# Patient Record
Sex: Male | Born: 1960 | Race: White | Hispanic: No | Marital: Married | Smoking: Never smoker
Health system: Southern US, Community
[De-identification: ages and names within clinical notes are randomized; demographics above are authoritative.]

## PROBLEM LIST (undated history)

## (undated) DIAGNOSIS — R42 Dizziness and giddiness: Secondary | ICD-10-CM

## (undated) DIAGNOSIS — Q613 Polycystic kidney, unspecified: Secondary | ICD-10-CM

## (undated) DIAGNOSIS — I252 Old myocardial infarction: Secondary | ICD-10-CM

## (undated) DIAGNOSIS — J45909 Unspecified asthma, uncomplicated: Secondary | ICD-10-CM

## (undated) DIAGNOSIS — I209 Angina pectoris, unspecified: Secondary | ICD-10-CM

## (undated) DIAGNOSIS — E785 Hyperlipidemia, unspecified: Secondary | ICD-10-CM

## (undated) DIAGNOSIS — Z8719 Personal history of other diseases of the digestive system: Secondary | ICD-10-CM

## (undated) DIAGNOSIS — I2510 Atherosclerotic heart disease of native coronary artery without angina pectoris: Secondary | ICD-10-CM

## (undated) HISTORY — PX: INGUINAL HERNIA REPAIR: SUR1180

## (undated) HISTORY — DX: Personal history of other diseases of the digestive system: Z87.19

## (undated) HISTORY — DX: Dizziness and giddiness: R42

---

## 1982-03-08 HISTORY — PX: FOOT SURGERY: SHX648

## 2008-03-03 ENCOUNTER — Emergency Department (HOSPITAL_BASED_OUTPATIENT_CLINIC_OR_DEPARTMENT_OTHER): Admission: EM | Admit: 2008-03-03 | Discharge: 2008-03-03 | Payer: Self-pay | Admitting: Emergency Medicine

## 2010-05-07 ENCOUNTER — Observation Stay (HOSPITAL_COMMUNITY)
Admission: AD | Admit: 2010-05-07 | Discharge: 2010-05-08 | Disposition: A | Discharge status: Home / Self Care | Payer: Managed Care, Other (non HMO) | Source: Other Acute Inpatient Hospital | Attending: Cardiovascular Disease | Admitting: Cardiovascular Disease

## 2010-05-07 ENCOUNTER — Emergency Department (HOSPITAL_COMMUNITY)
Admission: AD | Admit: 2010-05-07 | Discharge: 2010-05-07 | Disposition: A | Discharge status: Other Acute Inpatient Hospital | Payer: Managed Care, Other (non HMO) | Source: Other Acute Inpatient Hospital | Attending: Cardiovascular Disease | Admitting: Cardiovascular Disease

## 2010-05-07 ENCOUNTER — Emergency Department (INDEPENDENT_AMBULATORY_CARE_PROVIDER_SITE_OTHER): Payer: Managed Care, Other (non HMO)

## 2010-05-07 DIAGNOSIS — R110 Nausea: Secondary | ICD-10-CM | POA: Insufficient documentation

## 2010-05-07 DIAGNOSIS — R42 Dizziness and giddiness: Secondary | ICD-10-CM | POA: Insufficient documentation

## 2010-05-07 DIAGNOSIS — R079 Chest pain, unspecified: Secondary | ICD-10-CM

## 2010-05-07 DIAGNOSIS — R002 Palpitations: Secondary | ICD-10-CM | POA: Insufficient documentation

## 2010-05-07 DIAGNOSIS — M79609 Pain in unspecified limb: Secondary | ICD-10-CM | POA: Insufficient documentation

## 2010-05-07 DIAGNOSIS — I200 Unstable angina: Secondary | ICD-10-CM | POA: Insufficient documentation

## 2010-05-07 LAB — MRSA PCR SCREENING: MRSA by PCR: NEGATIVE

## 2010-05-07 LAB — CBC
HCT: 39.8 % (ref 39.0–52.0)
Hemoglobin: 14.4 g/dL (ref 13.0–17.0)
MCHC: 36.3 g/dL — ABNORMAL HIGH (ref 30.0–36.0)
MCV: 84.3 fL (ref 78.0–100.0)
Platelets: 224 10*3/uL (ref 150–400)
Platelets: 187 10*3/uL (ref 150–400)
RBC: 4.72 MIL/uL (ref 4.22–5.81)
RBC: 4.58 MIL/uL (ref 4.22–5.81)
WBC: 6.4 10*3/uL (ref 4.0–10.5)

## 2010-05-07 LAB — DIFFERENTIAL
Basophils Absolute: 0 10*3/uL (ref 0.0–0.1)
Lymphocytes Relative: 20 % (ref 12–46)
Lymphs Abs: 1.3 10*3/uL (ref 0.7–4.0)
Neutrophils Relative %: 72 % (ref 43–77)

## 2010-05-07 LAB — POCT CARDIAC MARKERS: Troponin i, poc: <0.05 ng/mL (ref 0.00–0.09)

## 2010-05-07 LAB — BASIC METABOLIC PANEL
BUN: 23 mg/dL (ref 6–23)
CO2: 25 mEq/L (ref 19–32)
Chloride: 109 mEq/L (ref 96–112)
Glucose, Bld: 123 mg/dL — ABNORMAL HIGH (ref 70–99)
Potassium: 4.2 mEq/L (ref 3.5–5.1)

## 2010-05-07 LAB — PROTIME-INR: INR: 0.97 (ref 0.00–1.49)

## 2010-05-07 LAB — APTT: aPTT: 31 seconds (ref 24–37)

## 2010-05-07 LAB — HEMOCCULT GUIAC POC 1CARD (OFFICE): Fecal Occult Bld: NEGATIVE

## 2010-05-08 DIAGNOSIS — R079 Chest pain, unspecified: Secondary | ICD-10-CM | POA: Insufficient documentation

## 2010-05-08 DIAGNOSIS — K219 Gastro-esophageal reflux disease without esophagitis: Secondary | ICD-10-CM | POA: Insufficient documentation

## 2010-05-08 DIAGNOSIS — I200 Unstable angina: Secondary | ICD-10-CM | POA: Insufficient documentation

## 2010-05-08 HISTORY — DX: Gastro-esophageal reflux disease without esophagitis: K21.9

## 2010-05-08 LAB — COMPREHENSIVE METABOLIC PANEL
AST: 16 U/L (ref 0–37)
Albumin: 4.1 g/dL (ref 3.5–5.2)
Chloride: 109 mEq/L (ref 96–112)
Creatinine, Ser: 1.51 mg/dL — ABNORMAL HIGH (ref 0.4–1.5)
GFR calc Af Amer: 59 mL/min — ABNORMAL LOW (ref >60)
Total Bilirubin: 0.7 mg/dL (ref 0.3–1.2)
Total Protein: 6.9 g/dL (ref 6.0–8.3)

## 2010-05-08 LAB — CARDIAC PANEL(CRET KIN+CKTOT+MB+TROPI)
CK, MB: 2.8 ng/mL (ref 0.3–4.0)
Relative Index: INVALID (ref 0.0–2.5)
Relative Index: INVALID (ref 0.0–2.5)
Total CK: 87 U/L (ref 7–232)
Total CK: 90 U/L (ref 7–232)
Troponin I: 0.01 ng/mL (ref 0.00–0.06)

## 2010-05-08 LAB — LIPID PANEL
Total CHOL/HDL Ratio: 6 RATIO
VLDL: 33 mg/dL (ref 0–40)

## 2010-05-11 ENCOUNTER — Encounter (INDEPENDENT_AMBULATORY_CARE_PROVIDER_SITE_OTHER): Payer: Managed Care, Other (non HMO) | Admitting: Nurse Practitioner

## 2010-05-11 ENCOUNTER — Encounter: Payer: Self-pay | Admitting: Nurse Practitioner

## 2010-05-11 ENCOUNTER — Encounter: Payer: Managed Care, Other (non HMO) | Admitting: Physician Assistant

## 2010-05-11 DIAGNOSIS — R079 Chest pain, unspecified: Secondary | ICD-10-CM

## 2010-05-13 ENCOUNTER — Encounter (INDEPENDENT_AMBULATORY_CARE_PROVIDER_SITE_OTHER): Payer: Self-pay | Admitting: *Deleted

## 2010-05-19 NOTE — Letter (Signed)
Summary: New Patient letter  [facility]  [address]   [place], [place] [address]   Phone: [phone]  Fax: [phone]       05/13/2010 MRN: [id number]  Tyler Sweeney [address] [place], [place]  [address]  [place]  Dear Tyler Sweeney,  Welcome to the Gastroenterology Division at [facility].    You are scheduled to see Dr.  Marina Goodell on 06/29/2010 at 9:30 on the 3rd floor at [facility], [address]  We ask that you try to arrive at our office 15 minutes prior to your appointment time to allow for check-in.  We would like you to complete the enclosed self-administered evaluation form prior to your visit and bring it with you on the day of your appointment.  We will review it with you.  Also, please bring a complete list of all your medications or, if you prefer, bring the medication bottles and we will list them.  Please bring your insurance card so that we may make a copy of it.  If your insurance requires a referral to see a specialist, please bring your referral form from your primary care physician.  Co-payments are due at the time of your visit and may be paid by cash, check or credit card.     Your office visit will consist of a consult with your physician (includes a physical exam), any laboratory testing he/she may order, scheduling of any necessary diagnostic testing (e.g. x-ray, ultrasound, CT-scan), and scheduling of a procedure (e.g. Endoscopy, Colonoscopy) if required.  Please allow enough time on your schedule to allow for any/all of these possibilities.    If you cannot keep your appointment, please call [phone] to cancel or reschedule prior to your appointment date.  This allows [place] the opportunity to schedule an appointment for another patient in need of care.  If you do not cancel or reschedule by 5 p.m. the business day prior to your appointment date, you will be charged a $50.00 late cancellation/no-show fee.    Thank you for choosing  [facility] for your medical needs.  We appreciate the opportunity to care for you.  Please visit [place] at our website  to learn more about our practice.                     Sincerely,                                                             The Gastroenterology Division

## 2010-05-22 NOTE — Discharge Summary (Signed)
  [name], [name]               ACCOUNT NO.:  [id number]  MEDICAL RECORD NO.:  [id number]           PATIENT TYPE:  I  LOCATION:  3303                         FACILITY:  [facility]  PHYSICIAN:  Tyler Sans. Dusty Raczkowski, Tyler Sweeney, FACCDATE OF BIRTH:  1960/07/07  DATE OF ADMISSION:  05/07/2010 DATE OF DISCHARGE:  05/08/2010                              DISCHARGE SUMMARY   PRIMARY CARDIOLOGIST:  Tyler Sweeney, Tyler Sweeney, [facility]  DISCHARGE DIAGNOSIS:  Chest pain without objective evidence of ischemia.  ALLERGIES:  No known drug allergies.  PROCEDURES:  None.  HISTORY OF PRESENT ILLNESS:  50 year old male with prior history of chest pain and reportedly negative Myoview in 2009, who is very active. On Wednesday, February 29, patient was exercising in his treadmill and had a gas-like sensation in his chest with tachy palpitations.  He stopped exercising and symptoms resolved quickly, and he was able to complete his workout.  On the day of admission, the patient noted uncomfortable sensation throughout his chest that persisted throughout the day.  This was slightly worse with position changes in the breathing.  He eventually presented to [facility] where ECG showed no acute changes and point-of-care markers were negative.  He was transferred to [facility] for further evaluation and admission.  HOSPITAL COURSE:  The patient had no further chest pain.  Cardiac enzymes remained negative.  I will plan to discharge the patient home today and have arranged for a followup exercise treadmill test early next week to further rule out ischemia.  DISCHARGE LABS:  Hemoglobin 14.8, hematocrit 39.8, WBC 6.4, platelets 224.  INR 0.97.  Sodium 145, potassium 4.2, chloride 109, CO2 25, BUN 23, creatinine 1.4, glucose was 123, total bilirubin 0.7, alkaline phosphatase 94, AST 16, ALT 18, total protein 6.9, albumin 4.1, calcium 9.4.  CK 90, MB 2.8, troponin-I 0.01.  Total cholesterol 198, triglycerides 167, HDL 33,  LDL 132.  Fetal occult blood was negative. MRSA screen was negative.  DISPOSITION:  The patient will be discharged home today in good condition.  FOLLOWUP PLAN AND APPOINTMENTS:  We have arranged for patient to undergo exercise treadmill testing with Tyler Sweeney, Tyler Sweeney, and Dr. Daleen Sweeney on March 5 at 8:30 a.m.  DISCHARGE MEDICATIONS: 1. Aspirin 81 mg daily. 2. Nitroglycerin 0.4 mg sublingual p.r.n. chest pain after doing     exercise treadmill test as above.  DURATION OF DISCHARGE ENCOUNTER:  35 minutes including physician time.     Tyler Sweeney, Tyler Sweeney   ______________________________ Tyler Sans. Tyler Squibb, Tyler Sweeney, Tyler Sweeney    CB/MEDQ  D:  05/08/2010  T:  05/09/2010  Job:  [id number]  Electronically Signed by Tyler Sweeney Tyler Sweeney on 05/12/2010 04:24:13 PM Electronically Signed by Valera Castle Tyler Sweeney FACC on 05/22/2010 09:17:23 AM

## 2010-06-29 ENCOUNTER — Ambulatory Visit: Payer: Managed Care, Other (non HMO) | Admitting: Internal Medicine

## 2010-07-06 NOTE — H&P (Signed)
NAMETEX, CONROY NO.:  [id number]  MEDICAL RECORD NO.:  [id number]           PATIENT TYPE:  I  LOCATION:  3303                         FACILITY:  [facility]  PHYSICIAN:  Therisa Doyne, MD    DATE OF BIRTH:  08/10/1960  DATE OF ADMISSION:  05/07/2010 DATE OF DISCHARGE:                             HISTORY & PHYSICAL   PRIMARY CARDIOLOGIST:  Vesta Mixer, MD  CHIEF COMPLAINT:  Chest pain.  HISTORY OF PRESENT ILLNESS:  A 50 year old white male with no significant past medical history presents for evaluation of chest pain. Historically in 2009, he reports undergoing a stress test which was normal.  He is very active and enjoys exercising on a treadmill and has been able to do so for quite some time without any significant cardiopulmonary limitations.  However, on Wednesday evening, he developed a sensation of "gas" while exercising on a treadmill.  He stated that his heart rate went up to 140 beats a minute.  He had an uncomfortable sensation.  He stopped and his gas pains went away. However, he got back on the treadmill and chest pain started again.  On Thursday, he reported that he had an uncomfortable sensation throughout his chest that was present constantly throughout the day.  He did notice there was some mild pleuritic component to it athat was worse when he moved in certain way.  He denies any reflux symptoms.  He denies any heart failure symptoms, PND, orthopnea, or lower extremity pain.  He denies any tachy palpitations or syncope.  He has no DVT risk factors.  Of note, because of his symptoms of chest pain, he went to an outside hospital.  At the outside hospital, there was concern about unstable angina.  He was given aspirin and started on nitroglycerin drip and heparin drip.  PAST MEDICAL HISTORY:  Negative stress test in 2009.  Occasional reflux symptoms.  SOCIAL HISTORY:  The patient works for [employer].  He denies any tobacco, alcohol,  or drug use.  FAMILY HISTORY:  Negative for premature coronary artery disease.  His father had myocardial infarction at age 39.  Hie mother had a myocardial infarction at age 80.  MEDICATIONS:  None.  ALLERGIES:  No known drug allergies.  REVIEW OF SYSTEMS:  All systems were reviewed and are  negative except as mentioned above in history of present illness.  PHYSICAL EXAMINATION:  VITAL SIGNS:  Temperature afebrile, blood pressure is 120/70, pulse 64, respirations 21, and oxygen saturation 95% on room air. GENERAL:  No acute distress. HEENT:  Normocephalic and atraumatic.  equal, round, and reactive to light and accommodation.  Extraocular movements are intact.  Oropharynx is pink and moist without lesions. NECK:  Supple.  No lymphadenopathy.  No jugular venous distention.  No masses. CARDIOVASCULAR:  Regular rate and rhythm with no murmurs, rubs, or gallops. CHEST:  Clear to auscultation bilaterally.  There is some mild tenderness to palpation over the left breast region. ABDOMEN:  Positive bowel sounds.  Soft, nontender, and nondistended. EXTREMITIES:  No clubbing, cyanosis, or edema.  Dorsalis pedis pulses 2+ bilaterally. SKIN:  No rashes.  NEUROLOGIC:  No focal deficits. PSYCHIATRIC:  Normal affect.  LABORATORY DATA:  Troponin less than 0.05 with a repeat troponin less than 0.01.  BNP is notable for a normal renal function with BUN of 23 and a creatinine of 1.4.  INR 0.97.  Hemoglobin 14.8 and platelets 224.  EKG from February 27 demonstrates sinus rhythm at 63 beats per minute, no acute ST-T wave changes.  ASSESSMENT AND PLAN:  A 50 year old white male with no prior cardiac history who presents for evaluation of chest pain. 1. We will admit the patient to [facility] under Dr. Harvie Bridge     care.  2. Chest pain.  I feel his clinical presentation is somewhat low risk     given the fact that he has had constant chest pain for 24 hours and     has a normal EKG  and negative cardiac biomarkers. I would suspect     that this chest pain is likely musculoskeletal, however, it could be     related to occult esophageal reflux disease.  We will continue him     on aspirin 325 mg daily.  I think we can discontinue his heparin     drip as well as his nitroglycerin drip.  We will cycle cardiac     enzymes and should they remain negative, I think he would be a good     candidate for exercise stress test in the morning.  Because we are     planning on exercise stress test, we will not use a beta blocker.     We will check fasting profile to risk stratify the patient  3. Fluids, electrolytes, and nutrition.  Saline lock IV fluid, n.p.o.  4. Deep venous thrombosis prophylaxis with subcutaneous heparin.     Therisa Doyne, MD     SJT/MEDQ  D:  05/08/2010  T:  05/08/2010  Job:  [id number]  Electronically Signed by Aldona Bar MD on 07/06/2010 09:39:52 PM

## 2011-10-01 ENCOUNTER — Emergency Department (HOSPITAL_BASED_OUTPATIENT_CLINIC_OR_DEPARTMENT_OTHER): Payer: Managed Care, Other (non HMO)

## 2011-10-01 ENCOUNTER — Encounter (HOSPITAL_BASED_OUTPATIENT_CLINIC_OR_DEPARTMENT_OTHER): Payer: Self-pay | Admitting: *Deleted

## 2011-10-01 ENCOUNTER — Emergency Department (HOSPITAL_BASED_OUTPATIENT_CLINIC_OR_DEPARTMENT_OTHER)
Admission: EM | Admit: 2011-10-01 | Discharge: 2011-10-01 | Disposition: A | Discharge status: Home / Self Care | Payer: Managed Care, Other (non HMO) | Attending: Emergency Medicine | Admitting: Emergency Medicine

## 2011-10-01 DIAGNOSIS — I10 Essential (primary) hypertension: Secondary | ICD-10-CM | POA: Insufficient documentation

## 2011-10-01 DIAGNOSIS — R42 Dizziness and giddiness: Secondary | ICD-10-CM | POA: Insufficient documentation

## 2011-10-01 DIAGNOSIS — Z7982 Long term (current) use of aspirin: Secondary | ICD-10-CM | POA: Insufficient documentation

## 2011-10-01 DIAGNOSIS — R209 Unspecified disturbances of skin sensation: Secondary | ICD-10-CM | POA: Insufficient documentation

## 2011-10-01 LAB — CBC WITH DIFFERENTIAL/PLATELET
Basophils Relative: 1 % (ref 0–1)
HCT: 38.8 % — ABNORMAL LOW (ref 39.0–52.0)
Hemoglobin: 14.2 g/dL (ref 13.0–17.0)
Lymphocytes Relative: 21 % (ref 12–46)
Monocytes Absolute: 0.6 10*3/uL (ref 0.1–1.0)
Monocytes Relative: 8 % (ref 3–12)
Neutro Abs: 5.2 10*3/uL (ref 1.7–7.7)
Neutrophils Relative %: 70 % (ref 43–77)
RBC: 4.53 MIL/uL (ref 4.22–5.81)
WBC: 7.3 10*3/uL (ref 4.0–10.5)

## 2011-10-01 LAB — BASIC METABOLIC PANEL
BUN: 22 mg/dL (ref 6–23)
CO2: 23 mEq/L (ref 19–32)
Chloride: 105 mEq/L (ref 96–112)
Creatinine, Ser: 1.6 mg/dL — ABNORMAL HIGH (ref 0.50–1.35)
GFR calc Af Amer: 56 mL/min — ABNORMAL LOW (ref >90)
Potassium: 4.2 mEq/L (ref 3.5–5.1)

## 2011-10-01 LAB — TROPONIN I: Troponin I: <0.3 ng/mL (ref <0.30)

## 2011-10-01 MED ORDER — HYDROCHLOROTHIAZIDE 25 MG PO TABS: 25.0000 mg | ORAL_TABLET | Freq: Every day | ORAL | Status: DC

## 2011-10-01 NOTE — ED Provider Notes (Addendum)
History     CSN: [id number]  Arrival date & time 10/01/11  2000   First MD Initiated Contact with Patient 10/01/11 2034      Chief Complaint  Patient presents with  . Dizziness  . Numbness    HPI Pt has been feeling dizzy today as well as numbness in his feet.  He was at work today when he again and feeling poorly with general malaise and dizziness. He does note he has been having a lot of stress at work.  He did have some nausea and some discomfort in his abdomen that he associated with his acid reflux. Earlier in the day he also had some discomfort in his chest that lasted a few seconds but that resolved. Patient decided to go home but the symptoms persisted. He went to get his blood pressure checked at a pharmacy and noted it to be elevated. Patient had not seen a doctor until recently and he was noted to have mild elevation in his blood pressure but he decided to try to manage this with diet and exercise and recheck with his doctor in a short period of time.  He denies any chest pain now. He is not having any trouble with shortness of breath. Has not noticed any leg swelling. He does not have any trouble with his speech, balance or coordination. The numbness in his feet with bilateral Past Medical History  Diagnosis Date  . Intermediate coronary syndrome   . Chest pain, unspecified   . Personal history of other diseases of digestive system     Past Surgical History  Procedure Date  . Hernia repair     Family History  Problem Relation Age of Onset  . Coronary artery disease    . Heart attack Father   . Heart attack Mother     History  Substance Use Topics  . Smoking status: Never Smoker   . Smokeless tobacco: Not on file  . Alcohol Use: No      Review of Systems  All other systems reviewed and are negative.    Allergies  Review of patient's allergies indicates not on file.  Home Medications   Current Outpatient Rx  Name Route Sig Dispense Refill  . ASPIRIN  81 MG PO TABS Oral Take 81 mg by mouth daily.      Marland Kitchen NITROGLYCERIN 0.4 MG SL SUBL Sublingual Place 0.4 mg under the tongue every 5 (five) minutes as needed.        BP 176/100  Pulse 72  Temp 98.1 F (36.7 C) (Oral)  Resp 16  Ht 6' (1.829 m)  Wt 198 lb (89.812 kg)  BMI 26.85 kg/m2  SpO2 100%  Physical Exam  Nursing note and vitals reviewed. Constitutional: He is oriented to person, place, and time. He appears well-developed and well-nourished. No distress.  HENT:  Head: Normocephalic and atraumatic.  Right Ear: External ear normal.  Left Ear: External ear normal.  Mouth/Throat: Oropharynx is clear and moist.  Eyes: Conjunctivae are normal. Right eye exhibits no discharge. Left eye exhibits no discharge. No scleral icterus.  Neck: Neck supple. No tracheal deviation present.  Cardiovascular: Normal rate, regular rhythm and intact distal pulses.   Pulmonary/Chest: Effort normal and breath sounds normal. No stridor. No respiratory distress. He has no wheezes. He has no rales.  Abdominal: Soft. Bowel sounds are normal. He exhibits no distension. There is no tenderness. There is no rebound and no guarding.  Musculoskeletal: He exhibits no edema and no  tenderness.  Neurological: He is alert and oriented to person, place, and time. He has normal strength. No cranial nerve deficit ( no gross defecits noted) or sensory deficit. He exhibits normal muscle tone. He displays no seizure activity. Coordination normal.       No pronator drift bilateral upper extrem, able to hold both legs off bed for 5 seconds, sensation intact in all extremities, no visual field cuts, no left or right sided neglect  Skin: Skin is warm and dry. No rash noted.  Psychiatric: He has a normal mood and affect.    ED Course  Procedures (including critical care time) EKG Rate 67 Normal sinus rhythm Incomplete right bundle branch block Possible Inferior infarct , age undetermined Anterior infarct , age  undetermined Abnormal Labs Reviewed  CBC WITH DIFFERENTIAL - Abnormal; Notable for the following:    HCT 38.8 (*)     MCHC 36.6 (*)     All other components within normal limits  BASIC METABOLIC PANEL - Abnormal; Notable for the following:    Creatinine, Ser 1.60 (*)     GFR calc non Af Amer 48 (*)     GFR calc Af Amer 56 (*)     All other components within normal limits  TROPONIN I   Ct Head Wo Contrast  10/01/2011  *RADIOLOGY REPORT*  Clinical Data: 51 year old male with dizziness and numbness.  CT HEAD WITHOUT CONTRAST  Technique:  Contiguous axial images were obtained from the base of the skull through the vertex without contrast.  Comparison: None.  Findings: Generalized cerebral and cerebellar atrophy noted.  No acute intracranial abnormalities are identified, including mass lesion or mass effect, hydrocephalus, extra-axial fluid collection, midline shift, hemorrhage, or acute infarction.  The visualized bony calvarium is unremarkable.  IMPRESSION: No acute intracranial abnormalities.  Atrophy.  Original Report Authenticated By: Rosendo Gros, M.D.      MDM  Patient presents with numerous symptoms today. I suspect there is a strong component of anxiety and stress playing a role. He does not have any focal neurologic symptoms to suggest stroke. I doubt TIA. Patient had a brief fleeting episode of chest discomfort and I do not feel that this is suggestive of acute coronary syndrome. Patient states he has been seeing a primary Dr. but did not initially want to take blood pressure medications. It is reasonable with his vital signs here today to consider a low dose diuretic. I will prescribe the patient hydrochlorothiazide and have him follow up with primary care Dr.   Isabella Bowens records reviewed.  Negative chest pain workup in 2012     Celene Kras, MD 10/01/11 2215  Celene Kras, MD 10/01/11 2217

## 2011-10-01 NOTE — ED Notes (Signed)
Pt states he has been under increased amount of stress at work and has been having palpitations, dizziness, and difficulty sleeping. Pt went to [facility] today to have BP checked and it was high so came here for evaluation.

## 2011-10-11 ENCOUNTER — Encounter (HOSPITAL_COMMUNITY): Payer: Self-pay

## 2011-10-11 ENCOUNTER — Emergency Department (HOSPITAL_COMMUNITY)
Admission: EM | Admit: 2011-10-11 | Discharge: 2011-10-12 | Disposition: A | Discharge status: Home / Self Care | Payer: Managed Care, Other (non HMO) | Attending: Emergency Medicine | Admitting: Emergency Medicine

## 2011-10-11 ENCOUNTER — Emergency Department (HOSPITAL_COMMUNITY): Payer: Managed Care, Other (non HMO)

## 2011-10-11 DIAGNOSIS — R109 Unspecified abdominal pain: Secondary | ICD-10-CM | POA: Insufficient documentation

## 2011-10-11 DIAGNOSIS — R1013 Epigastric pain: Secondary | ICD-10-CM | POA: Insufficient documentation

## 2011-10-11 DIAGNOSIS — Q613 Polycystic kidney, unspecified: Secondary | ICD-10-CM | POA: Insufficient documentation

## 2011-10-11 DIAGNOSIS — K30 Functional dyspepsia: Secondary | ICD-10-CM

## 2011-10-11 DIAGNOSIS — R110 Nausea: Secondary | ICD-10-CM | POA: Insufficient documentation

## 2011-10-11 DIAGNOSIS — K7689 Other specified diseases of liver: Secondary | ICD-10-CM | POA: Insufficient documentation

## 2011-10-11 DIAGNOSIS — R748 Abnormal levels of other serum enzymes: Secondary | ICD-10-CM | POA: Insufficient documentation

## 2011-10-11 DIAGNOSIS — K3189 Other diseases of stomach and duodenum: Secondary | ICD-10-CM | POA: Insufficient documentation

## 2011-10-11 LAB — COMPREHENSIVE METABOLIC PANEL
ALT: 17 U/L (ref 0–53)
Alkaline Phosphatase: 103 U/L (ref 39–117)
BUN: 16 mg/dL (ref 6–23)
CO2: 28 mEq/L (ref 19–32)
Chloride: 99 mEq/L (ref 96–112)
GFR calc Af Amer: 52 mL/min — ABNORMAL LOW (ref >90)
GFR calc non Af Amer: 45 mL/min — ABNORMAL LOW (ref >90)
Glucose, Bld: 93 mg/dL (ref 70–99)
Potassium: 3.9 mEq/L (ref 3.5–5.1)
Sodium: 139 mEq/L (ref 135–145)
Total Bilirubin: 0.7 mg/dL (ref 0.3–1.2)

## 2011-10-11 LAB — URINALYSIS, ROUTINE W REFLEX MICROSCOPIC
Bilirubin Urine: NEGATIVE
Hgb urine dipstick: NEGATIVE
Nitrite: NEGATIVE
Specific Gravity, Urine: 1.006 (ref 1.005–1.030)
Urobilinogen, UA: 0.2 mg/dL (ref 0.0–1.0)
pH: 6.5 (ref 5.0–8.0)

## 2011-10-11 LAB — CBC WITH DIFFERENTIAL/PLATELET
Hemoglobin: 16 g/dL (ref 13.0–17.0)
Lymphocytes Relative: 21 % (ref 12–46)
Lymphs Abs: 1.3 10*3/uL (ref 0.7–4.0)
Monocytes Relative: 7 % (ref 3–12)
Neutrophils Relative %: 71 % (ref 43–77)
Platelets: 232 10*3/uL (ref 150–400)
RBC: 5.08 MIL/uL (ref 4.22–5.81)
WBC: 6.5 10*3/uL (ref 4.0–10.5)

## 2011-10-11 LAB — LIPASE, BLOOD: Lipase: 187 U/L — ABNORMAL HIGH (ref 11–59)

## 2011-10-11 MED ORDER — ONDANSETRON 4 MG PO TBDP
8.0000 mg | ORAL_TABLET | Freq: Once | ORAL | Status: DC
  Filled 2011-10-11: qty 2

## 2011-10-11 MED ORDER — IOHEXOL 300 MG/ML  SOLN
20.0000 mL | INTRAMUSCULAR | Status: AC
  Administered 2011-10-11: 20 mL via ORAL

## 2011-10-11 MED ORDER — ONDANSETRON HCL 4 MG/2ML IJ SOLN: 4.0000 mg | Freq: Once | INTRAMUSCULAR | Status: DC

## 2011-10-11 MED ORDER — SODIUM CHLORIDE 0.9 % IV BOLUS (SEPSIS)
1000.0000 mL | Freq: Once | INTRAVENOUS | Status: AC
  Administered 2011-10-11: 1000 mL via INTRAVENOUS

## 2011-10-11 MED ORDER — IOHEXOL 300 MG/ML  SOLN
80.0000 mL | Freq: Once | INTRAMUSCULAR | Status: AC | PRN
  Administered 2011-10-11: 80 mL via INTRAVENOUS

## 2011-10-11 NOTE — ED Notes (Signed)
sts intestines are not working well, onset last few weeks, feels weak, unable to take in oral intake.

## 2011-10-11 NOTE — ED Provider Notes (Signed)
History     CSN: [id number]  Arrival date & time 10/11/11  1421   First MD Initiated Contact with Patient 10/11/11 2104      Chief Complaint  Patient presents with  . Abdominal Cramping  . Eating Disorder    (Consider location/radiation/quality/duration/timing/severity/associated sxs/prior treatment) Patient is a 51 y.o. male presenting with abdominal pain. The history is provided by the patient and the spouse.  Abdominal Pain The primary symptoms of the illness include abdominal pain and nausea. The primary symptoms of the illness do not include fever, shortness of breath, vomiting, diarrhea, hematemesis, hematochezia or dysuria. Episode onset: over 1 month. The onset of the illness was gradual. The problem has not changed since onset. The abdominal pain is located in the epigastric region. The abdominal pain does not radiate. The abdominal pain is relieved by nothing. The abdominal pain is exacerbated by belching (worse at night).  Nausea began 6 to 7 days ago. The nausea is associated with eating. The nausea is exacerbated by food.  Additional symptoms associated with the illness include constipation. Symptoms associated with the illness do not include chills, urgency, hematuria, frequency or back pain.    Past Medical History  Diagnosis Date  . Intermediate coronary syndrome   . Chest pain, unspecified   . Personal history of other diseases of digestive system     Past Surgical History  Procedure Date  . Hernia repair     Family History  Problem Relation Age of Onset  . Coronary artery disease    . Heart attack Father   . Heart attack Mother     History  Substance Use Topics  . Smoking status: Never Smoker   . Smokeless tobacco: Not on file  . Alcohol Use: No      Review of Systems  Constitutional: Positive for appetite change and unexpected weight change (lost 4 pounds recently). Negative for fever and chills.  HENT: Negative for congestion and rhinorrhea.     Eyes: Negative.   Respiratory: Negative for cough and shortness of breath.   Cardiovascular: Negative for chest pain and leg swelling.  Gastrointestinal: Positive for nausea, abdominal pain and constipation. Negative for vomiting, diarrhea, blood in stool, hematochezia and hematemesis.  Genitourinary: Negative for dysuria, urgency, frequency, hematuria and decreased urine volume.  Musculoskeletal: Negative for back pain.  Neurological: Positive for weakness (generalized). Negative for light-headedness, numbness and headaches.  Psychiatric/Behavioral: Negative for confusion.  All other systems reviewed and are negative.    Allergies  Review of patient's allergies indicates no known allergies.  Home Medications   Current Outpatient Rx  Name Route Sig Dispense Refill  . DOCUSATE SODIUM 100 MG PO CAPS Oral Take 100 mg by mouth 2 (two) times daily.    Marland Kitchen SIMETHICONE 80 MG PO CHEW Oral Chew 80 mg by mouth every 6 (six) hours as needed. For gas      BP 153/101  Pulse 66  Temp 97.1 F (36.2 C) (Oral)  Resp 18  SpO2 99%  Physical Exam  Nursing note and vitals reviewed. Constitutional: He is oriented to person, place, and time. He appears well-developed and well-nourished.  HENT:  Head: Normocephalic and atraumatic.  Right Ear: External ear normal.  Left Ear: External ear normal.  Nose: Nose normal.  Neck: Neck supple. No muscular tenderness present.  Cardiovascular: Normal rate, regular rhythm, normal heart sounds and intact distal pulses.   Pulmonary/Chest: Effort normal.  Abdominal: Soft. He exhibits no distension and no mass. There is no  tenderness.  Musculoskeletal: He exhibits no edema.  Neurological: He is alert and oriented to person, place, and time.  Skin: Skin is warm and dry.    ED Course  Procedures (including critical care time)  Labs Reviewed  CBC WITH DIFFERENTIAL - Abnormal; Notable for the following:    MCHC 36.5 (*)     All other components within normal  limits  COMPREHENSIVE METABOLIC PANEL - Abnormal; Notable for the following:    Creatinine, Ser 1.69 (*)     Total Protein 8.6 (*)     GFR calc non Af Amer 45 (*)     GFR calc Af Amer 52 (*)     All other components within normal limits  LIPASE, BLOOD - Abnormal; Notable for the following:    Lipase 187 (*)     All other components within normal limits  URINALYSIS, ROUTINE W REFLEX MICROSCOPIC  OCCULT BLOOD, POC DEVICE   Ct Abdomen Pelvis W Contrast  10/11/2011  *RADIOLOGY REPORT*  Clinical Data: Abdominal cramping, epigastric pain, elevated lipase  CT ABDOMEN AND PELVIS WITH CONTRAST  Technique:  Multidetector CT imaging of the abdomen and pelvis was performed following the standard protocol during bolus administration of intravenous contrast.  Contrast: 80mL OMNIPAQUE IOHEXOL 300 MG/ML  SOLN  Comparison: None.  Findings: Minimal dependent basilar atelectasis.  Normal heart size.  No pericardial or pleural effusion.  No hiatal hernia.  Abdomen:  Numerous scattered hypodense lesions throughout the liver.  Largest hepatic cyst in the right hepatic dome posteriorly measuring 2.5 cm with negative Hounsfield units of -4.43.  These are compatible with numerous small hepatic cysts.  No biliary dilatation.  Patent portal vein.  Gallbladder, biliary system, pancreas, spleen, and adrenal glands are within normal limits and demonstrate no acute process.  Kidneys demonstrate innumerable varying density hypodense cysts bilaterally.  Some of these may be hemorrhagic or have proteinaceous debris.  Largest cyst in the right kidney upper pole measures 4.4 cm, image 29.  Largest cyst in the left kidney lower pole measures 4.7 cm.  Normal symmetric excretion.  No hydronephrosis.  No obstructing stone disease.  Renal appearance is compatible with polycystic kidney disease.  No abdominal free fluid, fluid collection, hemorrhage, hematoma, or adenopathy.  Negative for bowel obstruction, dilatation, ileus pattern, or free  air.  Normal appendix demonstrated.  Pelvis:  Iliac vascular calcifications evident.  No pelvic free fluid, fluid collection, hemorrhage, adenopathy, inguinal abnormality, or hernia.  No acute distal bowel process.  Urinary bladder unremarkable.  Prominent prostate gland.  Degenerative changes of the spine. T11 vertebral body demonstrates cortical thickening, sclerosis and prominent trabecular pattern involving the vertebral body and posterior elements.  This appears to be a chronic process such as Paget's disease  favored over a hemangioma.  No associate compression fracture.  IMPRESSION: Polycystic kidney disease.  Numerous hepatic cysts as well.  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  Original Report Authenticated By: Judie Petit. Ruel Favors, M.D.     1. Burning sensation of stomach   2. Polycystic kidney   3. Liver cyst       MDM  51 yo male with abd pain (epigastric, suprapubic) that feels like burning for over a month, now 1 week of nausea and anorexia. Mild weight loss as well. Abd soft, no tenderness noted. Given fluids, labs checked. With mild elevation in lipase CT scan ordered, shows multiple kidney and liver cysts but normal pancreas. Has GI follow up tomorrow  already scheduled, will let family keep that appointment. Due to night-time burning will start on PPI. Discussed return precautions.       Pricilla Loveless, MD 10/12/11 [phone]

## 2011-10-11 NOTE — ED Notes (Signed)
Pt will not stop texting during triage assessment.

## 2011-10-12 ENCOUNTER — Other Ambulatory Visit: Payer: Self-pay | Admitting: Gastroenterology

## 2011-10-12 DIAGNOSIS — R1013 Epigastric pain: Secondary | ICD-10-CM

## 2011-10-12 MED ORDER — OMEPRAZOLE 20 MG PO CPDR: 20.0000 mg | DELAYED_RELEASE_CAPSULE | Freq: Two times a day (BID) | ORAL | Status: DC

## 2011-10-12 NOTE — ED Provider Notes (Signed)
I saw and evaluated the patient, reviewed the resident's note and I agree with the findings and plan.    Lyanne Co, MD 10/12/11 [phone]

## 2011-10-14 ENCOUNTER — Ambulatory Visit
Admission: RE | Admit: 2011-10-14 | Discharge: 2011-10-14 | Disposition: A | Discharge status: Home / Self Care | Payer: Managed Care, Other (non HMO) | Source: Ambulatory Visit | Attending: Gastroenterology | Admitting: Gastroenterology

## 2011-10-14 DIAGNOSIS — R1013 Epigastric pain: Secondary | ICD-10-CM

## 2011-11-02 ENCOUNTER — Encounter (HOSPITAL_COMMUNITY): Payer: Self-pay | Admitting: *Deleted

## 2011-11-02 ENCOUNTER — Ambulatory Visit (HOSPITAL_COMMUNITY)
Admission: RE | Admit: 2011-11-02 | Discharge: 2011-11-02 | Disposition: A | Discharge status: Home / Self Care | Payer: Managed Care, Other (non HMO) | Source: Ambulatory Visit | Attending: Gastroenterology | Admitting: Gastroenterology

## 2011-11-02 ENCOUNTER — Encounter (HOSPITAL_COMMUNITY)
Admission: RE | Disposition: A | Discharge status: Home / Self Care | Payer: Self-pay | Source: Ambulatory Visit | Attending: Gastroenterology

## 2011-11-02 DIAGNOSIS — R630 Anorexia: Secondary | ICD-10-CM | POA: Insufficient documentation

## 2011-11-02 DIAGNOSIS — Z79899 Other long term (current) drug therapy: Secondary | ICD-10-CM | POA: Insufficient documentation

## 2011-11-02 DIAGNOSIS — K219 Gastro-esophageal reflux disease without esophagitis: Secondary | ICD-10-CM | POA: Insufficient documentation

## 2011-11-02 DIAGNOSIS — R198 Other specified symptoms and signs involving the digestive system and abdomen: Secondary | ICD-10-CM | POA: Insufficient documentation

## 2011-11-02 DIAGNOSIS — D126 Benign neoplasm of colon, unspecified: Secondary | ICD-10-CM | POA: Insufficient documentation

## 2011-11-02 HISTORY — PX: COLONOSCOPY: SHX5424

## 2011-11-02 SURGERY — COLONOSCOPY
Anesthesia: Moderate Sedation

## 2011-11-02 MED ORDER — FENTANYL CITRATE 0.05 MG/ML IJ SOLN
INTRAMUSCULAR | Status: DC | PRN
  Administered 2011-11-02 (×3): 25 ug via INTRAVENOUS

## 2011-11-02 MED ORDER — SODIUM CHLORIDE 0.9 % IV SOLN
INTRAVENOUS | Status: DC
  Administered 2011-11-02: 500 mL via INTRAVENOUS

## 2011-11-02 MED ORDER — FENTANYL CITRATE 0.05 MG/ML IJ SOLN
INTRAMUSCULAR | Status: AC
  Filled 2011-11-02: qty 4

## 2011-11-02 MED ORDER — MIDAZOLAM HCL 10 MG/2ML IJ SOLN
INTRAMUSCULAR | Status: AC
  Filled 2011-11-02: qty 4

## 2011-11-02 MED ORDER — MIDAZOLAM HCL 5 MG/5ML IJ SOLN
INTRAMUSCULAR | Status: DC | PRN
  Administered 2011-11-02 (×3): 2.5 mg via INTRAVENOUS

## 2011-11-02 NOTE — Op Note (Signed)
[facility] [address] [place], [address]   COLONOSCOPY PROCEDURE REPORT  PATIENT: Tyler Sweeney, Tyler Sweeney  MR#: [id number] BIRTHDATE: 19-Aug-1960 , 51  yrs. old GENDER: Male ENDOSCOPIST: Carman Ching, MD REFERRED BY:  Joycelyn Rua PROCEDURE DATE:  11/02/2011 PROCEDURE:   Colonoscopy with snare polypectomy ASA CLASS:   Class I INDICATIONS:average risk patient for colon cancer. MEDICATIONS: Fentanyl 75 mcg IV and Versed 7.5 mg  DESCRIPTION OF PROCEDURE:   After the risks and benefits and of the procedure were explained, informed consent was obtained.        The Pentax Ped Colon [id number]  endoscope was introduced through the anus and advanced to the    .  The quality of the prep was good. .  The instrument was then slowly withdrawn as the colon was fully examined.     COLON FINDINGS: A normal appearing cecum, ileocecal valve, and appendiceal orifice were identified.  The ascending, hepatic flexure, transverse, splenic flexure, descending, sigmoid colon and rectum appeared unremarkable.  No polyps or cancers were seen. Two small pedunculated polyps, measuring 2.5 cm and about 5 mm in size, were found in the rectosigmoid colon.  Both were removed with hot snare. Few diverticuli were seen.   Retroflexed views revealed internal hemorrhoids.     The scope was then withdrawn from the patient and the procedure completed.  COMPLICATIONS: There were no complications. ENDOSCOPIC IMPRESSION: 1.   Normal colon 2.   Two pedunculated polyps, measuring 2.5 cm in size and about 5 mm, were found in the rectosigmoid colon . Removed witht hot snare.  RECOMMENDATIONS: 1.  Hold aspirin, aspirin products, and anti-inflammatory medication for 1 week. 2.  Await pathology results 3.  Call to schedule a follow-up appointment with endoscopist PRN   REPEAT EXAM: for Colonoscopy.  .  to be determined  NF:[id number] Lenise Arena, MD  _______________________________ Rosalie Doctor:  Carman Ching,  MD 11/02/2011 11:10 AM     PATIENT NAME:  [name], [name] MR#: [id number]

## 2011-11-02 NOTE — H&P (Signed)
  Subjective:   Patient is a 51 y.o. male presents with change in bowel habits. He had recent EGD that was unremarkable because of inability to be anorexia. Colonoscopy is scheduled.. Procedure including risks and benefits discussed in office.  Patient Active Problem List   Diagnosis Date Noted  . UNSTABLE ANGINA 05/08/2010  . CHEST PAIN 05/08/2010  . GASTROESOPHAGEAL REFLUX DISEASE, HX OF 05/08/2010   Past Medical History  Diagnosis Date  . Intermediate coronary syndrome   . Chest pain, unspecified   . Personal history of other diseases of digestive system     Past Surgical History  Procedure Date  . Hernia repair     Prescriptions prior to admission  Medication Sig Dispense Refill  . omeprazole (PRILOSEC) 20 MG capsule Take 1 capsule (20 mg total) by mouth 2 (two) times daily.  60 capsule  0  . docusate sodium (COLACE) 100 MG capsule Take 100 mg by mouth 2 (two) times daily.      . simethicone (MYLICON) 80 MG chewable tablet Chew 80 mg by mouth every 6 (six) hours as needed. For gas       No Known Allergies  History  Substance Use Topics  . Smoking status: Never Smoker   . Smokeless tobacco: Not on file  . Alcohol Use: No    Family History  Problem Relation Age of Onset  . Coronary artery disease    . Heart attack Father   . Heart attack Mother      Objective:   Patient Vitals for the past 8 hrs:  BP Temp Temp src Pulse Resp SpO2 Height Weight  11/02/11 0935 133/96 mmHg 97.8 F (36.6 C) Oral 63  18  97 % 6' (1.829 m) 82 kg (180 lb 12.4 oz)         See MD Preop evaluation      Assessment:   Change in bowel habits and anorexia. Patient has never had screening colonoscopy  Plan:   Schedule for colonoscopy. Procedure discussed in detail in the office.

## 2011-11-03 ENCOUNTER — Encounter (HOSPITAL_COMMUNITY): Payer: Self-pay | Admitting: Gastroenterology

## 2011-11-03 ENCOUNTER — Encounter (HOSPITAL_COMMUNITY): Payer: Self-pay

## 2012-03-08 DIAGNOSIS — I252 Old myocardial infarction: Secondary | ICD-10-CM | POA: Insufficient documentation

## 2012-03-08 HISTORY — DX: Old myocardial infarction: I25.2

## 2012-11-23 ENCOUNTER — Ambulatory Visit
Admission: RE | Admit: 2012-11-23 | Discharge: 2012-11-23 | Disposition: A | Discharge status: Home / Self Care | Payer: Managed Care, Other (non HMO) | Source: Ambulatory Visit | Attending: Cardiology | Admitting: Cardiology

## 2012-11-23 ENCOUNTER — Other Ambulatory Visit: Payer: Self-pay | Admitting: Cardiology

## 2012-11-23 ENCOUNTER — Encounter (HOSPITAL_COMMUNITY): Payer: Self-pay | Admitting: Pharmacy Technician

## 2012-11-23 DIAGNOSIS — I200 Unstable angina: Secondary | ICD-10-CM

## 2012-11-23 NOTE — H&P (Signed)
History and Physical  Tyler Sweeney    Date of visit:  11/23/2012 DOB:  [birth year]    Age:  52 yrs. Medical record number:  [id number]     Account number:  [id number] Primary Care Provider: Joycelyn Rua ____________________________ CURRENT DIAGNOSES  1. Angina unstable  2. Chest pain, other  3. Abnormal Exercise Nuclear Study  4. GERD  5. Hyperlipidemia ____________________________ ALLERGIES  Shellfish Derived, Sore throat ____________________________ MEDICATIONS  1. omeprazole 20 mg tablet,delayed release (DR/EC), 1 p.o. daily  2. Align 4 mg capsule, 1 p.o. daily  3. aspirin 81 mg tablet,chewable, 1 p.o. daily  4. nitroglycerin 0.4 mg tablet, sublingual, Take as directed  5. metoprolol tartrate 25 mg tablet, BID  6. Crestor 20 mg tablet, 1 p.o. daily ____________________________ CHIEF COMPLAINTS  Followup of Chest  pain ____________________________ HISTORY OF PRESENT ILLNESS  This very nice 52 year old male has a strong family history of heart disease. He has had episodic chest discomfort on and off for around 3 or 4 years and has had previous admissions as well as evaluation with nuclear perfusion scan in the past. A little over 3 weeks ago he had the onset after lunch of fairly intense midsternal chest discomfort was accompanied with a lot of belching and gas that would provide temporary relief. The discomfort abated after a extended period of time and he took aspirin as well as Zantac as a result. Since then he has noted episodic discomfort in his chest when he walks. He describes it as a tightness that is sometimes accompanied with belching and will sometimes relieves the discomfort. He has had more intense discomfort at prior levels of exercise and has cut back his activity to avoid getting the pain. He had a nuclear perfusion scan earlier today that had significant clinical angina the terminated the test associated with belching. He also had more pronounced T waves in his anterior  leads on EKG then were appreciated previously and had an abnormal nuclear perfusion scan with an ejection fraction of 41%, partially reversible ischemia in the anterolateral as well as inferior wall distally. He has taken nitroglycerin on a couple of occasions since I initially saw him with relief after about 5 minutes. Some of these episdoes have occurred at night after he laid down. He denies PND, orthopnea, syncope, or claudication. He is brought in at this time for elective catheterization. ___________________________ PAST HISTORY  Past Medical Illnesses:  hypertension, GERD, hyperlipidemia;  Cardiovascular Illnesses:  no previous history of cardiac disease.;  Surgical Procedures:  hernia repair, cyst l foot;  Cardiology Procedures-Invasive:  no history of prior cardiac procedures;  Cardiology Procedures-Noninvasive:  treadmill cardiolite September 2014;  LVEF of 41% documented via nuclear study on 11/23/2012,   ____________________________ CARDIO-PULMONARY TEST DATES EKG Date:  11/17/2012;  Nuclear Study Date:  11/23/2012;  Chest Xray Date: 10/01/2011;   ____________________________ FAMILY HISTORY Brother -- Brother alive and well Brother -- Brother alive and well Brother -- Brother alive and well Father -- Coronary Artery Disease, Heart Attack, Deceased Mother -- Coronary Artery Disease, Heart Attack, Deceased Sister -- Sister alive and well Sister -- Sister alive and well Sister -- Sister alive and well ____________________________ SOCIAL HISTORY Alcohol Use:  no alcohol use;  Smoking:  never smoked;  Lifestyle:  married;  Exercise:  cycling for approximately 45 minutes 4 days per week;  Occupation:  Leisure centre manager;  Residence:  lives with wife;   ____________________________ PHYSICAL EXAMINATION VITAL SIGNS  Blood Pressure:  120/70 Sitting, Right  arm, regular cuff   Pulse:  78/min. Weight:  183.00 lbs. Height:  72"BMI: 25  Constitutional:  pleasant white male in no acute  distress Skin:  warm and dry to touch, no apparent skin lesions, or masses noted. Head:  normocephalic, normal hair pattern, no masses or tenderness Eyes:  EOMS Intact, PERRLA, C and S clear, Funduscopic exam not done. ENT:  ears, nose and throat reveal no gross abnormalities.  Dentition good. Neck:  supple, without massess. No JVD, thyromegaly or carotid bruits. Carotid upstroke normal. Chest:  normal symmetry, clear to auscultation and percussion. Cardiac:  regular rhythm, normal S1 and S2, No S3 or S4, no murmurs, gallops or rubs detected. Abdomen:  abdomen soft,non-tender, no masses, no hepatospenomegaly, or aneurysm noted Peripheral Pulses:  the femoral,dorsalis pedis, and posterior tibial pulses are full and equal bilaterally with no bruits auscultated. Extremities & Back:  no deformities, clubbing, cyanosis, erythema or edema observed. Normal muscle strength and tone. Neurological:  no gross motor or sensory deficits noted, affect appropriate, oriented x3. ____________________________ MOST RECENT LIPID PANEL 01/03/12  CHOL TOTL 201 mg/dl, LDL 161 calc, HDL 36 mg/dl, TRIGLYCER 096 mg/dl and CHOL/HDL 5.6 (Calc) ____________________________ IMPRESSIONS/PLAN  1. Chest discomfort consistent with progressive angina with an abnormal myocardial perfusion scan and a suggestion of a previous myocardial infarction 2. Hyperlipidemia not currently treated 3. History of reflux 4. Significant family history of cardiac disease 5. Borderline blood pressure  Recommendations:  Cardiac catheterization was discussed with the patient including risks of myocardial infarction, death, stroke, bleeding, arrhythmia, dye allergy, or renal insufficiency. He understands and is willing to proceed. Possibility of percutaneous intervention at the same setting was also discussed with the patient including risks. In addition possibility of stenting at the same setting was discussed with him to be done by different  physician or potential options of bypass grafting versus medical treatment also discussed with the patient and his wife. He was started on metoprolol 25 BID and Crestor 20 mg today.  ____________________________ TODAYS ORDERS  1. CHEST XRAY: Today  2. Comprehensive Metabolic Panel: Today  3. TSH: Today  4. Complete Blood Count: Today  5. Draw PT/INR: Today  6. PTT: Today  7. Chest X-ray PA/Lat: today  8. Lipid Panel: Today  9. Cardiac cath/possible PCI  11/24/12                       ____________________________ Cardiology Physician:  Darden Palmer MD [facility]

## 2012-11-24 ENCOUNTER — Observation Stay (HOSPITAL_COMMUNITY)
Admission: EM | Admit: 2012-11-24 | Discharge: 2012-11-25 | DRG: 247 | Disposition: A | Discharge status: Home / Self Care | Payer: Managed Care, Other (non HMO) | Attending: Cardiology | Admitting: Cardiology

## 2012-11-24 ENCOUNTER — Emergency Department (HOSPITAL_COMMUNITY): Payer: Managed Care, Other (non HMO)

## 2012-11-24 ENCOUNTER — Encounter (HOSPITAL_COMMUNITY): Payer: Self-pay | Admitting: Emergency Medicine

## 2012-11-24 ENCOUNTER — Other Ambulatory Visit: Payer: Self-pay

## 2012-11-24 ENCOUNTER — Encounter (HOSPITAL_COMMUNITY)
Admission: EM | Disposition: A | Discharge status: Home / Self Care | Payer: Managed Care, Other (non HMO) | Source: Home / Self Care | Attending: Emergency Medicine

## 2012-11-24 ENCOUNTER — Ambulatory Visit (HOSPITAL_COMMUNITY)
Admission: RE | Admit: 2012-11-24 | Payer: Managed Care, Other (non HMO) | Source: Ambulatory Visit | Admitting: Cardiology

## 2012-11-24 DIAGNOSIS — E785 Hyperlipidemia, unspecified: Secondary | ICD-10-CM | POA: Diagnosis present

## 2012-11-24 DIAGNOSIS — K219 Gastro-esophageal reflux disease without esophagitis: Secondary | ICD-10-CM | POA: Diagnosis present

## 2012-11-24 DIAGNOSIS — R079 Chest pain, unspecified: Secondary | ICD-10-CM

## 2012-11-24 DIAGNOSIS — I200 Unstable angina: Secondary | ICD-10-CM | POA: Diagnosis present

## 2012-11-24 DIAGNOSIS — I2510 Atherosclerotic heart disease of native coronary artery without angina pectoris: Secondary | ICD-10-CM

## 2012-11-24 DIAGNOSIS — I10 Essential (primary) hypertension: Secondary | ICD-10-CM | POA: Diagnosis present

## 2012-11-24 DIAGNOSIS — Z8249 Family history of ischemic heart disease and other diseases of the circulatory system: Secondary | ICD-10-CM

## 2012-11-24 HISTORY — PX: PERCUTANEOUS CORONARY STENT INTERVENTION (PCI-S): SHX5485

## 2012-11-24 HISTORY — PX: LEFT HEART CATHETERIZATION WITH CORONARY ANGIOGRAM: SHX5451

## 2012-11-24 HISTORY — DX: Atherosclerotic heart disease of native coronary artery without angina pectoris: I25.10

## 2012-11-24 HISTORY — DX: Hyperlipidemia, unspecified: E78.5

## 2012-11-24 HISTORY — PX: CORONARY ANGIOPLASTY WITH STENT PLACEMENT: SHX49

## 2012-11-24 LAB — CBC
MCH: 31.6 pg (ref 26.0–34.0)
MCHC: 35.5 g/dL (ref 30.0–36.0)
Platelets: 213 10*3/uL (ref 150–400)
RBC: 4.34 MIL/uL (ref 4.22–5.81)

## 2012-11-24 LAB — BASIC METABOLIC PANEL
Calcium: 8.4 mg/dL (ref 8.4–10.5)
GFR calc non Af Amer: 60 mL/min — ABNORMAL LOW (ref >90)
Sodium: 145 mEq/L (ref 135–145)

## 2012-11-24 LAB — POCT I-STAT, CHEM 8
Calcium, Ion: 1.03 mmol/L — ABNORMAL LOW (ref 1.12–1.23)
Chloride: 109 mEq/L (ref 96–112)
Glucose, Bld: 82 mg/dL (ref 70–99)
HCT: 35 % — ABNORMAL LOW (ref 39.0–52.0)
Hemoglobin: 11.9 g/dL — ABNORMAL LOW (ref 13.0–17.0)
TCO2: 22 mmol/L (ref 0–100)

## 2012-11-24 LAB — POCT I-STAT TROPONIN I: Troponin i, poc: 0.02 ng/mL (ref 0.00–0.08)

## 2012-11-24 LAB — PROTIME-INR: INR: 1.09 (ref 0.00–1.49)

## 2012-11-24 LAB — POCT ACTIVATED CLOTTING TIME: Activated Clotting Time: 370 seconds

## 2012-11-24 SURGERY — LEFT HEART CATHETERIZATION WITH CORONARY ANGIOGRAM
Anesthesia: LOCAL

## 2012-11-24 MED ORDER — SODIUM CHLORIDE 0.9 % IV SOLN: INTRAVENOUS | Status: DC

## 2012-11-24 MED ORDER — TICAGRELOR 90 MG PO TABS
ORAL_TABLET | ORAL | Status: AC
  Administered 2012-11-24: 90 mg via ORAL
  Filled 2012-11-24: qty 2

## 2012-11-24 MED ORDER — FENTANYL CITRATE 0.05 MG/ML IJ SOLN
INTRAMUSCULAR | Status: AC
  Filled 2012-11-24: qty 2

## 2012-11-24 MED ORDER — DIAZEPAM 5 MG PO TABS: 10.0000 mg | ORAL_TABLET | ORAL | Status: DC

## 2012-11-24 MED ORDER — SODIUM CHLORIDE 0.9 % IV SOLN
1.7500 mg/kg/h | INTRAVENOUS | Status: DC
  Filled 2012-11-24: qty 250

## 2012-11-24 MED ORDER — BIVALIRUDIN 250 MG IV SOLR
INTRAVENOUS | Status: AC
  Filled 2012-11-24: qty 250

## 2012-11-24 MED ORDER — POTASSIUM CHLORIDE CRYS ER 20 MEQ PO TBCR
40.0000 meq | EXTENDED_RELEASE_TABLET | Freq: Once | ORAL | Status: AC
  Administered 2012-11-24: 40 meq via ORAL
  Filled 2012-11-24: qty 2

## 2012-11-24 MED ORDER — TICAGRELOR 90 MG PO TABS
90.0000 mg | ORAL_TABLET | Freq: Two times a day (BID) | ORAL | Status: DC
  Administered 2012-11-24 – 2012-11-25 (×2): 90 mg via ORAL
  Filled 2012-11-24 (×3): qty 1

## 2012-11-24 MED ORDER — OXYCODONE-ACETAMINOPHEN 5-325 MG PO TABS: 1.0000 | ORAL_TABLET | ORAL | Status: DC | PRN

## 2012-11-24 MED ORDER — ONDANSETRON HCL 4 MG/2ML IJ SOLN: 4.0000 mg | Freq: Four times a day (QID) | INTRAMUSCULAR | Status: DC | PRN

## 2012-11-24 MED ORDER — ASPIRIN 81 MG PO CHEW
81.0000 mg | CHEWABLE_TABLET | Freq: Every day | ORAL | Status: DC
  Administered 2012-11-25: 81 mg via ORAL
  Filled 2012-11-24: qty 1

## 2012-11-24 MED ORDER — TICAGRELOR 90 MG PO TABS: 90.0000 mg | ORAL_TABLET | Freq: Two times a day (BID) | ORAL | Status: DC

## 2012-11-24 MED ORDER — NITROGLYCERIN 0.2 MG/ML ON CALL CATH LAB
INTRAVENOUS | Status: AC
  Filled 2012-11-24: qty 1

## 2012-11-24 MED ORDER — ATORVASTATIN CALCIUM 40 MG PO TABS
40.0000 mg | ORAL_TABLET | Freq: Every day | ORAL | Status: DC
  Administered 2012-11-24: 23:00:00 40 mg via ORAL
  Filled 2012-11-24 (×3): qty 1

## 2012-11-24 MED ORDER — SODIUM CHLORIDE 0.9 % IV SOLN
INTRAVENOUS | Status: AC
  Administered 2012-11-24: via INTRAVENOUS

## 2012-11-24 MED ORDER — BIVALIRUDIN 250 MG IV SOLR: 0.2500 mg/kg/h | INTRAVENOUS | Status: AC

## 2012-11-24 MED ORDER — MIDAZOLAM HCL 2 MG/2ML IJ SOLN
INTRAMUSCULAR | Status: AC
  Filled 2012-11-24: qty 2

## 2012-11-24 MED ORDER — ACETAMINOPHEN 325 MG PO TABS: 650.0000 mg | ORAL_TABLET | ORAL | Status: DC | PRN

## 2012-11-24 MED ORDER — ASPIRIN 81 MG PO CHEW
324.0000 mg | CHEWABLE_TABLET | ORAL | Status: AC
  Administered 2012-11-24: 324 mg via ORAL

## 2012-11-24 MED ORDER — HEPARIN (PORCINE) IN NACL 2-0.9 UNIT/ML-% IJ SOLN
INTRAMUSCULAR | Status: AC
  Filled 2012-11-24: qty 1000

## 2012-11-24 MED ORDER — SODIUM CHLORIDE 0.9 % IV SOLN: 0.2500 mg/kg/h | INTRAVENOUS | Status: DC

## 2012-11-24 MED ORDER — METOPROLOL TARTRATE 25 MG PO TABS
25.0000 mg | ORAL_TABLET | Freq: Two times a day (BID) | ORAL | Status: DC
  Administered 2012-11-24: 25 mg via ORAL
  Filled 2012-11-24 (×4): qty 1

## 2012-11-24 MED ORDER — ASPIRIN 81 MG PO CHEW
CHEWABLE_TABLET | ORAL | Status: AC
  Filled 2012-11-24: qty 4

## 2012-11-24 MED ORDER — LIDOCAINE HCL (PF) 1 % IJ SOLN
INTRAMUSCULAR | Status: AC
  Filled 2012-11-24: qty 30

## 2012-11-24 MED ORDER — NITROGLYCERIN IN D5W 200-5 MCG/ML-% IV SOLN
INTRAVENOUS | Status: AC
  Filled 2012-11-24: qty 250

## 2012-11-24 MED ORDER — NITROGLYCERIN 0.4 MG SL SUBL: 0.4000 mg | SUBLINGUAL_TABLET | SUBLINGUAL | Status: DC | PRN

## 2012-11-24 MED ORDER — RAMIPRIL 10 MG PO CAPS
10.0000 mg | ORAL_CAPSULE | Freq: Every day | ORAL | Status: DC
  Administered 2012-11-24: 23:00:00 10 mg via ORAL
  Filled 2012-11-24 (×2): qty 1

## 2012-11-24 NOTE — Interval H&P Note (Signed)
History and Physical Interval Note:  11/24/2012 7:37 AM    Patient seen and examined.  No interval change in history and exam since last note. Stable for procedure. Noted he came in overnight with pain at rest.  Plan cath now.  Darden Palmer. MD [facility]  11/24/2012

## 2012-11-24 NOTE — ED Notes (Signed)
Patient and wife very anxious about cath that was scheduled for 530 AM.  Requesting to take his meds, instructed not to take any meds that are not given by a nurse.  Cardiology in to speak with  Patient and wife.

## 2012-11-24 NOTE — Progress Notes (Signed)
Stable post complex PCI of LAD and irst OM (Double stent of bifurcation)  BP up a little.  I will add ramipril tonight.  Home in am and out of work until I see back next week.  Darden Palmer MD [facility]

## 2012-11-24 NOTE — ED Provider Notes (Signed)
CSN: [id number]     Arrival date & time 11/24/12  0222 History   First MD Initiated Contact with Patient 11/24/12 0355     Chief Complaint  Patient presents with  . Chest Pain   (Consider location/radiation/quality/duration/timing/severity/associated sxs/prior Treatment) HPI History per patient. At home today, developed chest pain and took 3 nitroglycerin without complete relief. Chest pain left-sided and radiates to left arm. No associated shortness of breath. Some palpitations. Patient had stress test yesterday and scheduled for cardiac catheterization this morning. No cough. No leg pain or leg swelling. No fevers.  Symptoms moderate in severity. At this time chest pain is 0/10 Past Medical History  Diagnosis Date  . Intermediate coronary syndrome   . Chest pain, unspecified   . Personal history of other diseases of digestive system    Past Surgical History  Procedure Laterality Date  . Hernia repair    . Colonoscopy  11/02/2011    Procedure: COLONOSCOPY;  Surgeon: Vertell Novak., MD;  Location: [facility];  Service: Endoscopy;  Laterality: N/A;  . Foot surgery     Family History  Problem Relation Age of Onset  . Coronary artery disease    . Heart attack Father   . Heart attack Mother    History  Substance Use Topics  . Smoking status: Never Smoker   . Smokeless tobacco: Not on file  . Alcohol Use: No    Review of Systems  Constitutional: Negative for fever and chills.  HENT: Negative for neck pain and neck stiffness.   Respiratory: Negative for shortness of breath.   Cardiovascular: Positive for chest pain.  Gastrointestinal: Negative for abdominal pain.  Genitourinary: Negative for flank pain.  Musculoskeletal: Negative for back pain.  Skin: Negative for rash.  Neurological: Negative for headaches.  All other systems reviewed and are negative.    Allergies  Review of patient's allergies indicates no known allergies.  Home Medications   Current  Outpatient Rx  Name  Route  Sig  Dispense  Refill  . aspirin EC 325 MG tablet   Oral   Take 325 mg by mouth daily.         . metoprolol tartrate (LOPRESSOR) 25 MG tablet   Oral   Take 25 mg by mouth 2 (two) times daily.         . nitroGLYCERIN (NITROSTAT) 0.4 MG SL tablet   Sublingual   Place 0.4 mg under the tongue every 5 (five) minutes as needed for chest pain.         Marland Kitchen omeprazole (PRILOSEC OTC) 20 MG tablet   Oral   Take 20 mg by mouth daily.         . Probiotic Product (ALIGN) 4 MG CAPS   Oral   Take 1 tablet by mouth daily.         . rosuvastatin (CRESTOR) 20 MG tablet   Oral   Take 20 mg by mouth daily.          BP 110/81  Pulse 86  Temp(Src) 98.3 F (36.8 C) (Oral)  Resp 17  SpO2 97% Physical Exam  Constitutional: He is oriented to person, place, and time. He appears well-developed and well-nourished.  HENT:  Head: Normocephalic and atraumatic.  Eyes: EOM are normal. Pupils are equal, round, and reactive to light.  Neck: Neck supple.  Cardiovascular: Normal rate, regular rhythm and intact distal pulses.   Pulmonary/Chest: Effort normal and breath sounds normal. No respiratory distress. He exhibits no tenderness.  Abdominal: Soft. There is no tenderness.  Musculoskeletal: Normal range of motion. He exhibits no edema.  No calf tenderness  Neurological: He is alert and oriented to person, place, and time.  Skin: Skin is warm and dry.    ED Course  Procedures (including critical care time) Labs Review Labs Reviewed  CBC - Abnormal; Notable for the following:    HCT 38.6 (*)    All other components within normal limits  POCT I-STAT, CHEM 8 - Abnormal; Notable for the following:    Potassium 3.4 (*)    Calcium, Ion 1.03 (*)    Hemoglobin 11.9 (*)    HCT 35.0 (*)    All other components within normal limits  BASIC METABOLIC PANEL  PROTIME-INR  POCT I-STAT TROPONIN I   Imaging Review Dg Chest 2 View  11/23/2012   CLINICAL DATA:  Chest  pain for 1 day, nonsmoker  EXAM: CHEST  2 VIEW  COMPARISON:  05/07/2010  FINDINGS: Cardiomediastinal silhouette is stable. No acute infiltrate or pleural effusion. No pulmonary edema. Stable mild degenerative changes lower thoracic spine.  IMPRESSION: No active disease.   Electronically Signed   By: Natasha Mead   On: 11/23/2012 15:30   Dg Chest Portable 1 View  11/24/2012   CLINICAL DATA:  Chest pain  EXAM: PORTABLE CHEST - 1 VIEW  COMPARISON:  None.  FINDINGS: The heart size and mediastinal contours are within normal limits. Both lungs are clear. The visualized skeletal structures are unremarkable.  IMPRESSION: No active disease.   Electronically Signed   By: Tiburcio Pea   On: 11/24/2012 03:13    Date: 11/24/2012  Rate: 86  Rhythm: normal sinus rhythm  QRS Axis: normal  Intervals: normal  ST/T Wave abnormalities: nonspecific ST changes  Conduction Disutrbances:none  Narrative Interpretation:   Old EKG Reviewed:   4:57 AM d/w CAR Dr Terressa Koyanagi - plan admit  MDM  Diagnosis: Chest pain  EKG, labs, chest x-ray Cardiology consult/ admission    Sunnie Nielsen, MD 11/24/12 [phone]

## 2012-11-24 NOTE — Care Management Note (Signed)
    Page 1 of 1   11/24/2012     8:13:15 AM   CARE MANAGEMENT NOTE 11/24/2012  Patient:  Tyler Sweeney, Tyler Sweeney   Account Number:  [id number]  Date Initiated:  11/24/2012  Documentation initiated by:  Junius Creamer  Subjective/Objective Assessment:   adm w angina     Action/Plan:   lives w wife, pcp dr Silvestre Moment   Anticipated DC Date:     Anticipated DC Plan:  HOME/SELF CARE      DC Planning Services  CM consult      Choice offered to / List presented to:             Status of service:   Medicare Important Message given?   (If response is "NO", the following Medicare IM given date fields will be blank) Date Medicare IM given:   Date Additional Medicare IM given:    Discharge Disposition:    Per UR Regulation:  Reviewed for med. necessity/level of care/duration of stay  If discussed at Long Length of Stay Meetings, dates discussed:    Comments:

## 2012-11-24 NOTE — Progress Notes (Signed)
Site area: right groin  Site Prior to Removal:  Level 0  Pressure Applied For 20 MINUTES    Minutes Beginning at 2005  Manual:   yes  Patient Status During Pull:  Tolerated well Post Pull Groin Site:  Level 0  Post Pull Instructions Given:  yes written and verbal  Post Pull Pulses Present:  yes  Dressing Applied:  yes  Comments:  See frequent vs flowsheet

## 2012-11-24 NOTE — H&P (Signed)
History and Physical ____________________________ CURRENT DIAGNOSES  1. Angina unstable  2. Chest pain, other  3. Abnormal Exercise Nuclear Study  4. GERD  5. Hyperlipidemia ____________________________ ALLERGIES  Shellfish Derived, Sore throat ____________________________ MEDICATIONS  1. omeprazole 20 mg tablet,delayed release (DR/EC), 1 p.o. daily  2. Align 4 mg capsule, 1 p.o. daily  3. aspirin 81 mg tablet,chewable, 1 p.o. daily  4. nitroglycerin 0.4 mg tablet, sublingual, Take as directed  5. metoprolol tartrate 25 mg tablet, BID  6. Crestor 20 mg tablet, 1 p.o. daily ____________________________ CHIEF COMPLAINTS Recurrent chest pain ____________________________ HISTORY OF PRESENT ILLNESS  This very nice 52 year old male has a strong family history of heart disease. He has had episodic chest discomfort on and off for around 3 or 4 years and has had previous admissions as well as evaluation with nuclear perfusion scan in the past. A little over 3 weeks ago he had the onset after lunch of fairly intense midsternal chest discomfort was accompanied with a lot of belching and gas that would provide temporary relief. The discomfort abated after a extended period of time and he took aspirin as well as Zantac as a result. Since then he has noted episodic discomfort in his chest when he walks. He describes it as a tightness that is sometimes accompanied with belching and will sometimes relieves the discomfort. He has had more intense discomfort at prior levels of exercise and has cut back his activity to avoid getting the pain. He had a nuclear perfusion scan earlier today that had significant clinical angina the terminated the test associated with belching. He also had more pronounced T waves in his anterior leads on EKG then were appreciated previously and had an abnormal nuclear perfusion scan with an ejection fraction of 41%, partially reversible ischemia in the anterolateral as well as  inferior wall distally.   He now returns to ED after having two episodes of chest pain last night.  First was at 11pm which was resolved with one NTG.  Chest pain returned again around 1am with no relief after 3 NTG.  Pain and wife came into to ED for further evaluation.  Chest pain was resolved by the time of arrival.  He had no other associated symptoms and pain was similar to previous episodes.  He had no acute changes on ecg and negative biomarkers.  He was planned for outpatient cardiac catheterization today which patient is very anxious to proceed with as planned. ___________________________ PAST HISTORY  Past Medical Illnesses:  hypertension, GERD, hyperlipidemia;  Cardiovascular Illnesses:  no previous history of cardiac disease.;  Surgical Procedures:  hernia repair, cyst l foot;  Cardiology Procedures-Invasive:  no history of prior cardiac procedures;  Cardiology Procedures-Noninvasive:  treadmill cardiolite September 2014;  LVEF of 41% documented via nuclear study on 11/23/2012,   ____________________________ CARDIO-PULMONARY TEST DATES EKG Date:  11/17/2012;  Nuclear Study Date:  11/23/2012;  Chest Xray Date: 10/01/2011;   ____________________________ FAMILY HISTORY Brother -- Brother alive and well Brother -- Brother alive and well Brother -- Brother alive and well Father -- Coronary Artery Disease, Heart Attack, Deceased Mother -- Coronary Artery Disease, Heart Attack, Deceased Sister -- Sister alive and well Sister -- Sister alive and well Sister -- Sister alive and well ____________________________ SOCIAL HISTORY Alcohol Use:  no alcohol use;  Smoking:  never smoked;  Lifestyle:  married;  Exercise:  cycling for approximately 45 minutes 4 days per week;  Occupation:  Leisure centre manager;  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 Weight:  183.00  lbs. Height:  72"BMI: 25  Constitutional:  pleasant white male in no acute distress Skin:  warm and dry to touch, no apparent skin lesions, or masses noted. Head:  normocephalic, normal hair pattern, no masses or tenderness Eyes:  EOMS Intact, PERRLA, C and S clear, Funduscopic exam not done. ENT:  ears, nose and throat reveal no gross abnormalities.  Dentition good. Neck:  supple, without massess. No JVD, thyromegaly or carotid bruits. Carotid upstroke normal. Chest:  normal symmetry, clear to auscultation and percussion. Cardiac:  regular rhythm, normal S1 and S2, No S3 or S4, no murmurs, gallops or rubs detected. Abdomen:  abdomen soft,non-tender, no masses, no hepatospenomegaly, or aneurysm noted Peripheral Pulses:  the femoral,dorsalis pedis, and posterior tibial pulses are full and equal bilaterally with no bruits auscultated. Extremities & Back:  no deformities, clubbing, cyanosis, erythema or edema observed. Normal muscle strength and tone. Neurological:  no gross motor or sensory deficits noted, affect appropriate, oriented x3. ____________________________ MOST RECENT LIPID PANEL 01/03/12  CHOL TOTL 201 mg/dl, LDL 191 calc, HDL 36 mg/dl, TRIGLYCER 478 mg/dl and CHOL/HDL 5.6 (Calc) ____________________________ IMPRESSIONS/PLAN  1. Chest discomfort consistent with progressive angina with an abnormal myocardial perfusion scan and a suggestion of a previous myocardial infarction 2. Hyperlipidemia not currently treated 3. History of reflux 4. Significant family history of cardiac disease 5. Borderline blood pressure  Will admit to hospital given recurrent angina. Keep NPO for likely cardiac catheterization/possible PCI. Discussed with scheduling coordinator that patient is here in ED. Further plans pending clinical course and outcome of procedure.

## 2012-11-24 NOTE — ED Notes (Signed)
Pt. reports left chest pain with intermittent palpitations onset this evening , pt. took 3 NTG sl with slight relief , denies SOB /nausea or diaphoresis . Pt. Is schedule for cardiac catheterization at 5 am this morning by Dr. Donnie Aho.

## 2012-11-24 NOTE — CV Procedure (Addendum)
PERCUTANEOUS CORONARY INTERVENTION   Tyler Sweeney is a 52 y.o. male  INDICATION:  Unstable angina pectoris with subtotally occluded mid LAD and chronically occluded obtuse marginal #1   PROCEDURE: 1. DES stent, LAD  2. Bifurcation stenting (culotte) proximal circumflex/OM 1/Circumflex continuation. Complex prolonged procedure 3 hrs.   CONSENT: The risks, benefits, and details of the procedure were explained to the patient. Risks including death, MI, stroke, bleeding, limb ischemia, renal failure and allergy were described and accepted by the patient.  Informed written consent was obtained prior to proceeding.  PROCEDURE TECHNIQUE: Dr. Donnie Aho perform diagnostic catheterization using a 5 French sheath. I consulted with Dr. Donnie Aho about the patient in the laboratory.  After discussion we decided to proceed with PCI of the LAD and obtuse marginal if possible. The 5 French sheath was exchanged to a 6 [place] over a guidewire in the right femoral artery.  Coronary guiding shots were made using a 6 French XB LAD 3.5 cm catheter. Antithrombotic therapy, bivalirudin bolus and infusion, was begun and determined to be therapeutic by ACT. Antiplatelet therapy,Brilinta 180 mg., was loaded.  We approached the left anterior descending as our initial target. A standing guidewire was used to cross the stenosis with moderate difficulty. We predilated with a 2. 0 mm x 12 mm balloon. He had initial difficulty with the balloon watermelon seeding. We then exchanged for a 2.5 x 15 mm balloon. We then positioned and deployed a 3.0 x 24 mm long Promus Premier drug-eluting stent. Peak pressure and deployed to 14 atmospheres. The stent was then postdilated with a 3.0 x 20 mm long [place] balloon to 14 atmospheres. A nice result was obtained. Contrast used and duration of procedure was felt to be significantly short enough to attempt obtuse marginal #1 recanalization.  We therefore turned our attention to the first obtuse  marginal branch total occlusion. The occlusion was quite focal and we felt to be crossed without much difficulty. I use a standard floppy wires use in the LAD but they would not cross. I then loaded a Miracle Brother 3 g wire and a 2.0 x 12 mm long over-the-wire balloon. The Miracle Brother wire was then advanced into the total occlusion and with a moderate amount of pressure easily crossed into the large obtuse marginal #1. We then advanced the balloon into the stenosis and predilated. The over-the-wire balloon was used as an exchange catheter. We placed a 300 cm Run-through balloon distally of the first obtuse marginal in exchange for the miracle Brother wire. We removed the over-the-wire balloon and then use a 2.5 x 10 mm long cutting balloon to dilate the ostial lesion in the marginal. This recanalize the vessel but there was significant recoil noted. After giving intracoronary nitroglycerin it became clear that the obtuse marginal was quite large. I then exchanged to 5 cutting balloon for a 3.0 x 10 mm long cutting balloon. 2 inflations were performed to 6 atmospheres. Post cutting balloon angioplasty we noted a dissection that extended into the proximal circumflex just short of the left main. The cutting balloon was removed. A poor wire was placed down the circumflex. A 3.0 x 24 mm long Promus Premier balloon was then positioned and deployed from the ostial circumflex into the large first obtuse marginal to 14 atmospheres. We then post dilated the stent proximally to 3.25 mm in diameter with a 15 mm long  balloon to 14 atmospheres. The jail circumflex now had a high-grade obstruction but no reduction in flow. A  BMW wire was used to cross the stent struts into the circumflex. The previously, jailed/trapped circumflex wire was removed. We predilated the stent struts with a 2.5 x 12 mm long balloon that we already had on the table. After some consideration we decided to perform culotte stenting. We placed a  2.75 x 20 mm long Promus Premier and deployed to 15 atmospheres. The proximal margin of the stent was overlapped as closely as possible with the previously placed stent in the proximal circumflex. The stent was then post dilated with the 3.25 x 15 mm long [place] balloon to 13 atmospheres. We then used a new wire, BMW, to cross through the just placed stent into the first obtuse marginal. The trapped guidewire in the obtuse marginal was then easily retrieved. A 2.5 x 12 mm long balloon was then used to predilate the stent struts in the direction of the obtuse marginal. We then placed a 2.5 mm x 15 mm long balloon in the direction of the first obtuse marginal and a 3.0 x 15 mm long balloon into the circumflex and formed a final kissing inflation. Each balloon was inflated to 11 atmospheres. The balloon in the direction of the circumflex was an [place] balloon.  Final left coronary images were obtained and the results in all treated segments was felt to be acceptable  CONTRAST:  Total of 500 cc cc.  COMPLICATIONS:  500 cc of contrast used and nearly 60 minutes of fluoroscopy time.    ANGIOGRAPHIC RESULTS:   1. 99% mid LAD stenosis reduced to 0% with TIMI grade 3 flow after DES stenting. Post dilated to 3.0 mm in diameter.  2. Bifurcation stenting of the proximal circumflex/obtuse marginal #1 ([place] 0, 1, 1) using the culotte technique reducing a total occlusion of the large first obtuse marginal and 50-60% stenosis in the circumflex to 0% throughout the treated region. TIMI grade 3 flow was noted.   IMPRESSIONS:  1. Complicated and very lengthy multivessel PCI.  2. Successful DES implantation in the mid LAD  3. Successful bifurcation stenting of the proximal circumflex (60%)/first obtuse marginal (chronic occlusion) to 0% throughout the treated region with TIMI grade 3 flow.  RECOMMENDATION:  1. Aggressive IV hydration due to contrast load.  2. One year therapy with aspirin and Brilinta. Then consider  switching to palvix and aspirin for an additional time period.  3. May need treatment of the mid circumflex if symptoms continue.    Lesleigh Noe, MD 11/24/2012 1:40 PM

## 2012-11-24 NOTE — CV Procedure (Signed)
Cardiac Catheterization Report   Tyler Sweeney    52 y.o.  male  DOB: 24-Jul-1960  MRN: [id number]  11/24/2012    PROCEDURE:  Left heart catheterization with selective coronary angiography, left ventriculogram.  INDICATIONS:  Unstable angina pectoris, abnormal Cardiolite test, class IV angina  The risks, benefits, and details of the procedure were explained to the patient.  The patient verbalized understanding and wanted to proceed.  Informed written consent was obtained.  PROCEDURE TECHNIQUE:  After Xylocaine anesthesia a 85F sheath was placed in the right femoral artery with a single anterior needle wall stick.   Left coronary angiography was done using a [place] L4 guide catheter.  Right coronary angiography was done using a [place] R4 guide catheter.  A 30 cc ventriculogram was performed in the 30 degree RAO projection.  Tolerated the procedure well.  The digital images were reviewed with while the patient was on the table and in light of pain at rest in worsening symptoms the decision was made to proceed with percutaneous intervention and Dr. Verdis Prime agreed and he assumed care.  CONTRAST:  Total of 70 cc.  COMPLICATIONS:  None.    HEMODYNAMICS:  Aortic postcontrast 118/71, left ventricle postcontrast 118/0-6.  There was no gradient between the left ventricle and aorta.    ANGIOGRAPHIC DATA:    CORONARY ARTERIES:   Arise and distribute normally.  Right dominant. Mild coronary calcification is noted.  Left main coronary artery: Normal  Left anterior descending: Calcified proximally with scattered irregularities, after a large first diagonal and septal perforator system is a severe subtotal 99% mid left anterior descending stenosis. The LAD wraps around the apex and appears to be somewhat underfilled with antegrade flow due to the severity of the stenosis. Collaterals are seen to the septal perforator system.  Circumflex coronary artery: First  marginal branch is subtotally occluded and fills through very faint antegrade collaterals but a well-developed system of collaterals from the left coronary system, there is segmental disease of 40-50% in the mid vessel and distal vessel..  Right coronary artery: Dominant vessel that contains no significant disease.  LEFT VENTRICULOGRAM:  Performed in the 30 RAO projection.  The aortic and mitral valves are normal. The left ventricle is normal in size with hypokinesis of the distal inferior wall and apex. Estimated ejection fraction appears to be 50-55%.   IMPRESSIONS:  1. Coronary artery disease with subtotal stenosis in the mid LAD and the first marginal branch with class IV angina 2. Abnormal left ventricular function with hypokinesis of the distal inferior wall and apex  RECOMMENDATION:  Dr. Katrinka Blazing consulted and we'll plan percutaneous intervention of the LAD and the first marginal branch. He will attempt to LAD with stenting in May or may not need to do stenting versus cutting balloon angioplasty of the first marginal branch depending on how the LAD goes.   Darden Palmer MD [facility]

## 2012-11-25 LAB — CBC
Platelets: 170 10*3/uL (ref 150–400)
RBC: 4.19 MIL/uL — ABNORMAL LOW (ref 4.22–5.81)
WBC: 6.2 10*3/uL (ref 4.0–10.5)

## 2012-11-25 LAB — BASIC METABOLIC PANEL
CO2: 22 mEq/L (ref 19–32)
Calcium: 8.9 mg/dL (ref 8.4–10.5)
Chloride: 103 mEq/L (ref 96–112)
GFR calc Af Amer: 58 mL/min — ABNORMAL LOW (ref >90)
Sodium: 136 mEq/L (ref 135–145)

## 2012-11-25 MED ORDER — TICAGRELOR 90 MG PO TABS: 90.0000 mg | ORAL_TABLET | Freq: Two times a day (BID) | ORAL | Status: DC

## 2012-11-25 MED ORDER — ASPIRIN 81 MG PO CHEW: 81.0000 mg | CHEWABLE_TABLET | Freq: Every day | ORAL | Status: AC

## 2012-11-25 NOTE — Discharge Summary (Signed)
Physician Discharge Summary  Patient ID: Tyler Sweeney MRN: [id number] DOB/AGE: 03/09/1960 52 y.o.  Admit date: 11/24/2012 Discharge date: 11/25/2012  Admission Diagnoses:    Discharge Diagnoses:  Principal Problem:   UNSTABLE ANGINA Active Problems:   GERD (gastroesophageal reflux disease)   CAD (coronary artery disease)   Hyperlipidemia   Discharged Condition: good  Hospital Course: HPI on admit: This very nice 52 year old male has a strong family history of heart disease. He has had episodic chest discomfort on and off for around 3 or 4 years and has had previous admissions as well as evaluation with nuclear perfusion scan in the past. A little over 3 weeks ago he had the onset after lunch of fairly intense midsternal chest discomfort was accompanied with a lot of belching and gas that would provide temporary relief. The discomfort abated after a extended period of time and he took aspirin as well as Zantac as a result. Since then he has noted episodic discomfort in his chest when he walks. He describes it as a tightness that is sometimes accompanied with belching and will sometimes relieves the discomfort. He has had more intense discomfort at prior levels of exercise and has cut back his activity to avoid getting the pain. He had a nuclear perfusion scan earlier today that had significant clinical angina the terminated the test associated with belching. He also had more pronounced T waves in his anterior leads on EKG then were appreciated previously and had an abnormal nuclear perfusion scan with an ejection fraction of 41%, partially reversible ischemia in the anterolateral as well as inferior wall distally.  He now returns to ED after having two episodes of chest pain last night. First was at 11pm which was resolved with one NTG. Chest pain returned again around 1am with no relief after 3 NTG. Pain and wife came into to ED for further evaluation. Chest pain was resolved by the time of  arrival. He had no other associated symptoms and pain was similar to previous episodes. He had no acute changes on ecg and negative biomarkers. He was planned for outpatient cardiac catheterization today which patient is very anxious to proceed with as planned.  Cardiac cath 11/24/12:  IMPRESSIONS: 1. Complicated and very lengthy multivessel PCI.  2. Successful DES implantation in the mid LAD  3. Successful bifurcation stenting of the proximal circumflex (60%)/first obtuse marginal (chronic occlusion) to 0% throughout the treated region with TIMI grade 3 flow.  RECOMMENDATION: 1. Aggressive IV hydration due to contrast load.  2. One year therapy with aspirin and Brilinta. Then consider switching to palvix and aspirin for an additional time period.  3. May need treatment of the mod circumflex if symptoms continue.  Creatinine increased to 1.5 from 1.33. 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 May need re initiation as outpatient. BP 104/85  Discharge Exam: Blood pressure 116/82, pulse 71, temperature 98.1 F (36.7 C), temperature source Oral, resp. rate 18, height 6' (1.829 m), weight 83.1 kg (183 lb 3.2 oz), SpO2 97.00%.  GEN: AAO x 3 in NAD CV: RRR, no m/r/g Lung: CTAB ABD: soft nt +BS Ext: normal pulses  Disposition: 01-Home or Self Care  Discharge Orders   Future Orders Complete By Expires   Diet - low sodium heart healthy  As directed    Increase activity slowly  As directed        Medication List    STOP taking these medications       aspirin EC 325 MG tablet  Replaced by:  aspirin 81 MG chewable tablet      TAKE these medications       ALIGN 4 MG Caps  Take 1 tablet by mouth daily.     aspirin 81 MG chewable tablet  Chew 1 tablet (81 mg total) by mouth daily.     metoprolol tartrate 25 MG tablet  Commonly known as:  LOPRESSOR  Take 25 mg by mouth 2 (two) times daily.     nitroGLYCERIN 0.4 MG SL tablet  Commonly known as:  NITROSTAT  Place 0.4 mg under the tongue every  5 (five) minutes as needed for chest pain.     omeprazole 20 MG tablet  Commonly known as:  PRILOSEC OTC  Take 20 mg by mouth daily.     rosuvastatin 20 MG tablet  Commonly known as:  CRESTOR  Take 20 mg by mouth daily.     Ticagrelor 90 MG Tabs tablet  Commonly known as:  BRILINTA  Take 1 tablet (90 mg total) by mouth 2 (two) times daily.           Follow-up Information   Follow up with TILLEY JR,W SPENCER, MD In 3 days. (Get appt on Tues or Weds.)    Specialty:  Cardiology   Contact information:   [address] [place] [address] [phone]       Signed: Donato Schultz 11/25/2012, 9:36 AM

## 2012-11-25 NOTE — Progress Notes (Signed)
CARDIAC REHAB PHASE I   PRE:  Rate/Rhythm: 78 SR  BP:  Sitting: 108/70     SaO2: 98% RA  MODE:  Ambulation: 500 ft   POST:  Rate/Rhythm: 78 SR  BP:  Sitting: 113/77     SaO2: 100% RA  8:46am-9:27am Patient walked at a steady but slow pace.  Patient felt slight dizziness when standing up from the bed.  Patient did not c/o chest pain or shortness of breath during ambulation.  Patient stated that he wanted to consult with his doctor before deciding on Cardiac Rehab Phase 2.  Patient was left sitting on the edge of bed with wife in the room.   Theresa Duty, [place] 11/25/2012 9:23 AM

## 2012-11-27 MED FILL — Sodium Chloride IV Soln 0.9%: INTRAVENOUS | Qty: 50 | Status: AC

## 2013-07-04 ENCOUNTER — Encounter (HOSPITAL_BASED_OUTPATIENT_CLINIC_OR_DEPARTMENT_OTHER): Payer: Self-pay | Admitting: Emergency Medicine

## 2013-07-04 ENCOUNTER — Emergency Department (HOSPITAL_BASED_OUTPATIENT_CLINIC_OR_DEPARTMENT_OTHER)
Admission: EM | Admit: 2013-07-04 | Discharge: 2013-07-04 | Disposition: A | Discharge status: Home / Self Care | Payer: Managed Care, Other (non HMO) | Attending: Emergency Medicine | Admitting: Emergency Medicine

## 2013-07-04 ENCOUNTER — Emergency Department (HOSPITAL_BASED_OUTPATIENT_CLINIC_OR_DEPARTMENT_OTHER): Payer: Managed Care, Other (non HMO)

## 2013-07-04 DIAGNOSIS — Z7982 Long term (current) use of aspirin: Secondary | ICD-10-CM | POA: Insufficient documentation

## 2013-07-04 DIAGNOSIS — Z9861 Coronary angioplasty status: Secondary | ICD-10-CM | POA: Insufficient documentation

## 2013-07-04 DIAGNOSIS — E785 Hyperlipidemia, unspecified: Secondary | ICD-10-CM | POA: Insufficient documentation

## 2013-07-04 DIAGNOSIS — R079 Chest pain, unspecified: Secondary | ICD-10-CM | POA: Insufficient documentation

## 2013-07-04 DIAGNOSIS — K219 Gastro-esophageal reflux disease without esophagitis: Secondary | ICD-10-CM | POA: Insufficient documentation

## 2013-07-04 DIAGNOSIS — I252 Old myocardial infarction: Secondary | ICD-10-CM | POA: Insufficient documentation

## 2013-07-04 DIAGNOSIS — Z7902 Long term (current) use of antithrombotics/antiplatelets: Secondary | ICD-10-CM | POA: Insufficient documentation

## 2013-07-04 DIAGNOSIS — R0602 Shortness of breath: Secondary | ICD-10-CM | POA: Insufficient documentation

## 2013-07-04 DIAGNOSIS — I2510 Atherosclerotic heart disease of native coronary artery without angina pectoris: Secondary | ICD-10-CM | POA: Insufficient documentation

## 2013-07-04 DIAGNOSIS — Z79899 Other long term (current) drug therapy: Secondary | ICD-10-CM | POA: Insufficient documentation

## 2013-07-04 DIAGNOSIS — I200 Unstable angina: Secondary | ICD-10-CM | POA: Insufficient documentation

## 2013-07-04 HISTORY — DX: Old myocardial infarction: I25.2

## 2013-07-04 LAB — CBC WITH DIFFERENTIAL/PLATELET
BASOS PCT: 1 % (ref 0–1)
Basophils Absolute: 0 10*3/uL (ref 0.0–0.1)
EOS ABS: 0.1 10*3/uL (ref 0.0–0.7)
EOS PCT: 1 % (ref 0–5)
HCT: 39.4 % (ref 39.0–52.0)
Hemoglobin: 14.1 g/dL (ref 13.0–17.0)
LYMPHS ABS: 0.9 10*3/uL (ref 0.7–4.0)
Lymphocytes Relative: 12 % (ref 12–46)
MCH: 32.8 pg (ref 26.0–34.0)
MCHC: 35.8 g/dL (ref 30.0–36.0)
MCV: 91.6 fL (ref 78.0–100.0)
MONOS PCT: 7 % (ref 3–12)
Monocytes Absolute: 0.6 10*3/uL (ref 0.1–1.0)
Neutro Abs: 6.2 10*3/uL (ref 1.7–7.7)
Neutrophils Relative %: 79 % — ABNORMAL HIGH (ref 43–77)
PLATELETS: 182 10*3/uL (ref 150–400)
RBC: 4.3 MIL/uL (ref 4.22–5.81)
RDW: 12.7 % (ref 11.5–15.5)
WBC: 7.8 10*3/uL (ref 4.0–10.5)

## 2013-07-04 LAB — BASIC METABOLIC PANEL
BUN: 22 mg/dL (ref 6–23)
CALCIUM: 9.4 mg/dL (ref 8.4–10.5)
CO2: 24 mEq/L (ref 19–32)
Chloride: 102 mEq/L (ref 96–112)
Creatinine, Ser: 1.4 mg/dL — ABNORMAL HIGH (ref 0.50–1.35)
GFR calc Af Amer: 65 mL/min — ABNORMAL LOW (ref >90)
GFR, EST NON AFRICAN AMERICAN: 56 mL/min — AB (ref >90)
GLUCOSE: 129 mg/dL — AB (ref 70–99)
Potassium: 4 mEq/L (ref 3.7–5.3)
SODIUM: 139 meq/L (ref 137–147)

## 2013-07-04 LAB — TROPONIN I
Troponin I: <0.3 ng/mL (ref <0.30)
Troponin I: <0.3 ng/mL (ref <0.30)

## 2013-07-04 LAB — PRO B NATRIURETIC PEPTIDE: Pro B Natriuretic peptide (BNP): 107.1 pg/mL (ref 0–125)

## 2013-07-04 MED ORDER — ASPIRIN 81 MG PO CHEW
324.0000 mg | CHEWABLE_TABLET | Freq: Once | ORAL | Status: AC
  Administered 2013-07-04: 324 mg via ORAL
  Filled 2013-07-04: qty 4

## 2013-07-04 MED ORDER — GI COCKTAIL ~~LOC~~
30.0000 mL | Freq: Once | ORAL | Status: AC
  Administered 2013-07-04: 30 mL via ORAL
  Filled 2013-07-04: qty 30

## 2013-07-04 NOTE — ED Provider Notes (Signed)
Care assumed from Dr. Dina Rich at shift change. Patient is a 53 year old male who is 7 months status post stenting of his coronary arteries. He presents today after an episode of chest discomfort. He arrived here pain-free. Initial troponin was negative and EKG showed nonspecific T wave changes in the inferior leads which appear to be unchanged from prior studies.  I spoke with Dr. Wynonia Lawman from cardiology who is very familiar with this patient. It was his recommendation to perform a second troponin.  If this is negative, he will follow the patient up in the office in the next couple of days.  Veryl Speak, MD 07/04/13 1758

## 2013-07-04 NOTE — ED Provider Notes (Signed)
CSN: [id number]     Arrival date & time 07/04/13  1418 History   First MD Initiated Contact with Patient 07/04/13 1426     Chief Complaint  Patient presents with  . Chest Pain     (Consider location/radiation/quality/duration/timing/severity/associated sxs/prior Treatment) HPI  This is a 53 year old male with a history of coronary artery disease status post stent placement in September 2014 who presents for chest pain. Patient reports onset of chest pain yesterday. He states that he had chest pain at approximately 2:30 AM it woke him from sleep. He reports that his burning and left-sided. It is nonradiating. He is currently pain-free and rates his pain is 0/10. He states that this pain seems different than his anginal pain in the past. He denies any association with food. He does endorse some shortness of breath. He denies any fevers or cough.  Past Medical History  Diagnosis Date  . Intermediate coronary syndrome   . Chest pain, unspecified   . Personal history of other diseases of digestive system   . CAD (coronary artery disease) 11/24/2012    11/24/12 Cath normal LM, 99%LAD post diagonal, 100% 1rst OM with collaterals, 40% circumflex, no RCA Stent LAD Dr. Tamala Julian    . Hyperlipidemia 11/24/2012  . GERD (gastroesophageal reflux disease) 05/08/2010  . MI, old    Past Surgical History  Procedure Laterality Date  . Hernia repair    . Colonoscopy  11/02/2011    Procedure: COLONOSCOPY;  Surgeon: Winfield Cunas., MD;  Location: [facility];  Service: Endoscopy;  Laterality: N/A;  . Foot surgery     Family History  Problem Relation Age of Onset  . Coronary artery disease    . Heart attack Father   . Heart attack Mother    History  Substance Use Topics  . Smoking status: Never Smoker   . Smokeless tobacco: Not on file  . Alcohol Use: No    Review of Systems  Constitutional: Negative.  Negative for fever.  Respiratory: Positive for chest tightness and shortness of breath.    Cardiovascular: Positive for chest pain.  Gastrointestinal: Negative.  Negative for abdominal pain.  Genitourinary: Negative.  Negative for dysuria.  Musculoskeletal: Negative for back pain.  Skin: Negative for rash.  Neurological: Negative for headaches.  All other systems reviewed and are negative.     Allergies  Review of patient's allergies indicates no known allergies.  Home Medications   Prior to Admission medications   Medication Sig Start Date End Date Taking? Authorizing Provider  aspirin 81 MG chewable tablet Chew 1 tablet (81 mg total) by mouth daily. 11/25/12   Candee Furbish, MD  metoprolol tartrate (LOPRESSOR) 25 MG tablet Take 25 mg by mouth 2 (two) times daily.    Historical Provider, MD  nitroGLYCERIN (NITROSTAT) 0.4 MG SL tablet Place 0.4 mg under the tongue every 5 (five) minutes as needed for chest pain.    Historical Provider, MD  omeprazole (PRILOSEC OTC) 20 MG tablet Take 20 mg by mouth daily.    Historical Provider, MD  Probiotic Product (ALIGN) 4 MG CAPS Take 1 tablet by mouth daily.    Historical Provider, MD  rosuvastatin (CRESTOR) 20 MG tablet Take 20 mg by mouth daily.    Historical Provider, MD  Ticagrelor (BRILINTA) 90 MG TABS tablet Take 1 tablet (90 mg total) by mouth 2 (two) times daily. 11/25/12   Candee Furbish, MD   BP 146/82  Pulse 65  Temp(Src) 98.8 F (37.1 C)  Resp  18  Ht 6' (1.829 m)  Wt 179 lb (81.194 kg)  BMI 24.27 kg/m2  SpO2 100% Physical Exam  Nursing note and vitals reviewed. Constitutional: He is oriented to person, place, and time. He appears well-developed and well-nourished. No distress.  Thin  HENT:  Head: Normocephalic and atraumatic.  Eyes: Pupils are equal, round, and reactive to light.  Neck: Neck supple. No JVD present.  Cardiovascular: Normal rate, regular rhythm and normal heart sounds.   No murmur heard. Pulmonary/Chest: Effort normal and breath sounds normal. No respiratory distress. He has no wheezes.  Abdominal:  Soft. Bowel sounds are normal. There is no tenderness. There is no rebound.  Musculoskeletal: He exhibits no edema.  Lymphadenopathy:    He has no cervical adenopathy.  Neurological: He is alert and oriented to person, place, and time.  Skin: Skin is warm and dry.  Psychiatric: He has a normal mood and affect.    ED Course  Procedures (including critical care time) Labs Review Labs Reviewed  CBC WITH DIFFERENTIAL - Abnormal; Notable for the following:    Neutrophils Relative % 79 (*)    All other components within normal limits  BASIC METABOLIC PANEL  PRO B NATRIURETIC PEPTIDE  TROPONIN I    Imaging Review No results found.   EKG Interpretation None     EKG in Muse  EKG independently reviewed by myself: Normal sinus rhythm, new T wave inversions noted in leads 3 and aVF which is new compared to prior  MDM   Final diagnoses:  None   Patient presents with chest pain which started yesterday. He is nontoxic on exam. He denies chest pain at this time. The patient was given a full dose aspirin. While patient states that his pain is unlike his known anginal pain, given shortness of breath and new T wave inversions in the inferior leads, would be concerned for ACS. Have reviewed patient's cath report and patient had extensive coronary disease. Lab work was sent chest x-ray obtained. Patient signed out to Dr. Stark Jock. Primary cardiologist Dr. Wynonia Lawman. Recommend cardiology evaluation and testing complete.    Merryl Hacker, MD 07/04/13 1515

## 2013-07-04 NOTE — ED Notes (Signed)
Pt c/o left sided chest pain x 2 days" off and on" also c/o SOB

## 2013-07-04 NOTE — Discharge Instructions (Signed)
Followup with Dr. Wynonia Lawman in the next one to 2 days. Call tomorrow to arrange this followup appointment.  Return to the emergency department if you develop increasing pain, difficulty breathing, or other new or concerning symptoms.   Chest Pain (Nonspecific) It is often hard to give a specific diagnosis for the cause of chest pain. There is always a chance that your pain could be related to something serious, such as a heart attack or a blood clot in the lungs. You need to follow up with your caregiver for further evaluation. CAUSES   Heartburn.  Pneumonia or bronchitis.  Anxiety or stress.  Inflammation around your heart (pericarditis) or lung (pleuritis or pleurisy).  A blood clot in the lung.  A collapsed lung (pneumothorax). It can develop suddenly on its own (spontaneous pneumothorax) or from injury (trauma) to the chest.  Shingles infection (herpes zoster virus). The chest wall is composed of bones, muscles, and cartilage. Any of these can be the source of the pain.  The bones can be bruised by injury.  The muscles or cartilage can be strained by coughing or overwork.  The cartilage can be affected by inflammation and become sore (costochondritis). DIAGNOSIS  Lab tests or other studies, such as X-rays, electrocardiography, stress testing, or cardiac imaging, may be needed to find the cause of your pain.  TREATMENT   Treatment depends on what may be causing your chest pain. Treatment may include:  Acid blockers for heartburn.  Anti-inflammatory medicine.  Pain medicine for inflammatory conditions.  Antibiotics if an infection is present.  You may be advised to change lifestyle habits. This includes stopping smoking and avoiding alcohol, caffeine, and chocolate.  You may be advised to keep your head raised (elevated) when sleeping. This reduces the chance of acid going backward from your stomach into your esophagus.  Most of the time, nonspecific chest pain will  improve within 2 to 3 days with rest and mild pain medicine. HOME CARE INSTRUCTIONS   If antibiotics were prescribed, take your antibiotics as directed. Finish them even if you start to feel better.  For the next few days, avoid physical activities that bring on chest pain. Continue physical activities as directed.  Do not smoke.  Avoid drinking alcohol.  Only take over-the-counter or prescription medicine for pain, discomfort, or fever as directed by your caregiver.  Follow your caregiver's suggestions for further testing if your chest pain does not go away.  Keep any follow-up appointments you made. If you do not go to an appointment, you could develop lasting (chronic) problems with pain. If there is any problem keeping an appointment, you must call to reschedule. SEEK MEDICAL CARE IF:   You think you are having problems from the medicine you are taking. Read your medicine instructions carefully.  Your chest pain does not go away, even after treatment.  You develop a rash with blisters on your chest. SEEK IMMEDIATE MEDICAL CARE IF:   You have increased chest pain or pain that spreads to your arm, neck, jaw, back, or abdomen.  You develop shortness of breath, an increasing cough, or you are coughing up blood.  You have severe back or abdominal pain, feel nauseous, or vomit.  You develop severe weakness, fainting, or chills.  You have a fever. THIS IS AN EMERGENCY. Do not wait to see if the pain will go away. Get medical help at once. Call your local emergency services (911 in [place] Do not drive yourself to the hospital. MAKE SURE YOU:  Understand these instructions.  Will watch your condition.  Will get help right away if you are not doing well or get worse. Document Released: 12/02/2004 Document Revised: 05/17/2011 Document Reviewed: 09/28/2007 [facility] Patient Information 2014 [place].

## 2013-07-05 ENCOUNTER — Encounter (HOSPITAL_BASED_OUTPATIENT_CLINIC_OR_DEPARTMENT_OTHER): Payer: Self-pay | Admitting: Emergency Medicine

## 2013-07-05 ENCOUNTER — Emergency Department (HOSPITAL_BASED_OUTPATIENT_CLINIC_OR_DEPARTMENT_OTHER): Payer: Managed Care, Other (non HMO)

## 2013-07-05 ENCOUNTER — Encounter (HOSPITAL_COMMUNITY)
Admission: EM | Disposition: A | Discharge status: Home / Self Care | Payer: Self-pay | Source: Home / Self Care | Attending: Cardiology

## 2013-07-05 ENCOUNTER — Other Ambulatory Visit: Payer: Self-pay

## 2013-07-05 ENCOUNTER — Inpatient Hospital Stay (HOSPITAL_BASED_OUTPATIENT_CLINIC_OR_DEPARTMENT_OTHER)
Admission: EM | Admit: 2013-07-05 | Discharge: 2013-07-06 | DRG: 251 | Disposition: A | Discharge status: Home / Self Care | Payer: Managed Care, Other (non HMO) | Attending: Cardiology | Admitting: Cardiology

## 2013-07-05 DIAGNOSIS — I200 Unstable angina: Secondary | ICD-10-CM

## 2013-07-05 DIAGNOSIS — R079 Chest pain, unspecified: Secondary | ICD-10-CM

## 2013-07-05 DIAGNOSIS — N183 Chronic kidney disease, stage 3 unspecified: Secondary | ICD-10-CM | POA: Diagnosis present

## 2013-07-05 DIAGNOSIS — E785 Hyperlipidemia, unspecified: Secondary | ICD-10-CM | POA: Diagnosis present

## 2013-07-05 DIAGNOSIS — K6389 Other specified diseases of intestine: Secondary | ICD-10-CM

## 2013-07-05 DIAGNOSIS — R42 Dizziness and giddiness: Secondary | ICD-10-CM

## 2013-07-05 DIAGNOSIS — I2510 Atherosclerotic heart disease of native coronary artery without angina pectoris: Principal | ICD-10-CM

## 2013-07-05 HISTORY — DX: Polycystic kidney, unspecified: Q61.3

## 2013-07-05 HISTORY — DX: Chest pain, unspecified: R07.9

## 2013-07-05 HISTORY — DX: Unspecified asthma, uncomplicated: J45.909

## 2013-07-05 HISTORY — PX: PERCUTANEOUS CORONARY STENT INTERVENTION (PCI-S): SHX5485

## 2013-07-05 HISTORY — DX: Unstable angina: I20.0

## 2013-07-05 HISTORY — DX: Angina pectoris, unspecified: I20.9

## 2013-07-05 HISTORY — PX: LEFT HEART CATHETERIZATION WITH CORONARY ANGIOGRAM: SHX5451

## 2013-07-05 HISTORY — PX: CORONARY ANGIOPLASTY: SHX604

## 2013-07-05 LAB — CBC WITH DIFFERENTIAL/PLATELET
BASOS PCT: 1 % (ref 0–1)
Basophils Absolute: 0 10*3/uL (ref 0.0–0.1)
EOS ABS: 0.1 10*3/uL (ref 0.0–0.7)
EOS PCT: 1 % (ref 0–5)
HEMATOCRIT: 40.7 % (ref 39.0–52.0)
HEMOGLOBIN: 14.4 g/dL (ref 13.0–17.0)
Lymphocytes Relative: 23 % (ref 12–46)
Lymphs Abs: 1.5 10*3/uL (ref 0.7–4.0)
MCH: 32.5 pg (ref 26.0–34.0)
MCHC: 35.4 g/dL (ref 30.0–36.0)
MCV: 91.9 fL (ref 78.0–100.0)
MONO ABS: 0.6 10*3/uL (ref 0.1–1.0)
MONOS PCT: 9 % (ref 3–12)
Neutro Abs: 4.3 10*3/uL (ref 1.7–7.7)
Neutrophils Relative %: 66 % (ref 43–77)
Platelets: 187 10*3/uL (ref 150–400)
RBC: 4.43 MIL/uL (ref 4.22–5.81)
RDW: 12.6 % (ref 11.5–15.5)
WBC: 6.5 10*3/uL (ref 4.0–10.5)

## 2013-07-05 LAB — COMPREHENSIVE METABOLIC PANEL
ALBUMIN: 4.3 g/dL (ref 3.5–5.2)
ALT: 75 U/L — ABNORMAL HIGH (ref 0–53)
AST: 41 U/L — ABNORMAL HIGH (ref 0–37)
Alkaline Phosphatase: 107 U/L (ref 39–117)
BILIRUBIN TOTAL: 0.5 mg/dL (ref 0.3–1.2)
BUN: 18 mg/dL (ref 6–23)
CALCIUM: 9.2 mg/dL (ref 8.4–10.5)
CHLORIDE: 106 meq/L (ref 96–112)
CO2: 22 mEq/L (ref 19–32)
CREATININE: 1.4 mg/dL — AB (ref 0.50–1.35)
GFR calc Af Amer: 65 mL/min — ABNORMAL LOW (ref >90)
GFR calc non Af Amer: 56 mL/min — ABNORMAL LOW (ref >90)
Glucose, Bld: 139 mg/dL — ABNORMAL HIGH (ref 70–99)
Potassium: 4.3 mEq/L (ref 3.7–5.3)
Sodium: 141 mEq/L (ref 137–147)
TOTAL PROTEIN: 7.3 g/dL (ref 6.0–8.3)

## 2013-07-05 LAB — TROPONIN I: Troponin I: <0.3 ng/mL (ref <0.30)

## 2013-07-05 LAB — MRSA PCR SCREENING: MRSA BY PCR: NEGATIVE

## 2013-07-05 LAB — POCT ACTIVATED CLOTTING TIME: Activated Clotting Time: 564 seconds

## 2013-07-05 LAB — LIPASE, BLOOD: LIPASE: 95 U/L — AB (ref 11–59)

## 2013-07-05 SURGERY — LEFT HEART CATHETERIZATION WITH CORONARY ANGIOGRAM
Anesthesia: LOCAL | Site: Groin | Laterality: Right

## 2013-07-05 MED ORDER — ASPIRIN 81 MG PO CHEW
324.0000 mg | CHEWABLE_TABLET | Freq: Once | ORAL | Status: AC
  Administered 2013-07-05: 324 mg via ORAL
  Filled 2013-07-05: qty 4

## 2013-07-05 MED ORDER — ASPIRIN 81 MG PO CHEW: 81.0000 mg | CHEWABLE_TABLET | Freq: Every day | ORAL | Status: DC

## 2013-07-05 MED ORDER — BIVALIRUDIN 250 MG IV SOLR
INTRAVENOUS | Status: AC
  Filled 2013-07-05: qty 250

## 2013-07-05 MED ORDER — SODIUM CHLORIDE 0.9 % IV SOLN: 250.0000 mL | INTRAVENOUS | Status: DC | PRN

## 2013-07-05 MED ORDER — ONDANSETRON HCL 4 MG/2ML IJ SOLN
INTRAMUSCULAR | Status: AC
  Administered 2013-07-05: 4 mg via INTRAVENOUS
  Filled 2013-07-05: qty 2

## 2013-07-05 MED ORDER — NITROGLYCERIN 0.2 MG/ML ON CALL CATH LAB
INTRAVENOUS | Status: AC
  Filled 2013-07-05: qty 1

## 2013-07-05 MED ORDER — SODIUM CHLORIDE 0.9 % IJ SOLN
3.0000 mL | Freq: Two times a day (BID) | INTRAMUSCULAR | Status: DC
Start: 2013-07-05 — End: 2013-07-05

## 2013-07-05 MED ORDER — PANTOPRAZOLE SODIUM 40 MG PO TBEC
40.0000 mg | DELAYED_RELEASE_TABLET | Freq: Every day | ORAL | Status: DC
  Administered 2013-07-05 – 2013-07-06 (×2): 40 mg via ORAL
  Filled 2013-07-05 (×2): qty 1

## 2013-07-05 MED ORDER — NITROGLYCERIN 0.4 MG SL SUBL
0.4000 mg | SUBLINGUAL_TABLET | SUBLINGUAL | Status: DC | PRN
  Filled 2013-07-05: qty 25

## 2013-07-05 MED ORDER — HEPARIN (PORCINE) IN NACL 2-0.9 UNIT/ML-% IJ SOLN
INTRAMUSCULAR | Status: AC
  Filled 2013-07-05: qty 1000

## 2013-07-05 MED ORDER — ENOXAPARIN SODIUM 40 MG/0.4ML ~~LOC~~ SOLN
40.0000 mg | SUBCUTANEOUS | Status: DC
  Filled 2013-07-05: qty 0.4

## 2013-07-05 MED ORDER — NITROGLYCERIN 0.4 MG SL SUBL
0.4000 mg | SUBLINGUAL_TABLET | SUBLINGUAL | Status: DC | PRN
Start: 2013-07-05 — End: 2013-07-05

## 2013-07-05 MED ORDER — ONDANSETRON HCL 4 MG/2ML IJ SOLN: 4.0000 mg | Freq: Three times a day (TID) | INTRAMUSCULAR | Status: DC | PRN

## 2013-07-05 MED ORDER — ONDANSETRON HCL 4 MG/2ML IJ SOLN
4.0000 mg | Freq: Once | INTRAMUSCULAR | Status: AC
  Administered 2013-07-05: 4 mg via INTRAVENOUS

## 2013-07-05 MED ORDER — SODIUM CHLORIDE 0.9 % IV SOLN: 1.0000 mL/kg/h | INTRAVENOUS | Status: DC

## 2013-07-05 MED ORDER — SODIUM CHLORIDE 0.9 % IJ SOLN: 3.0000 mL | INTRAMUSCULAR | Status: DC | PRN

## 2013-07-05 MED ORDER — ONDANSETRON HCL 4 MG/2ML IJ SOLN
INTRAMUSCULAR | Status: AC
  Administered 2013-07-05: 07:00:00 4 mg via INTRAVENOUS
  Filled 2013-07-05: qty 2

## 2013-07-05 MED ORDER — ACETAMINOPHEN 325 MG PO TABS: 650.0000 mg | ORAL_TABLET | ORAL | Status: DC | PRN

## 2013-07-05 MED ORDER — DIAZEPAM 5 MG/ML IJ SOLN
5.0000 mg | Freq: Once | INTRAMUSCULAR | Status: AC
  Administered 2013-07-05: 5 mg via INTRAVENOUS
  Filled 2013-07-05: qty 2

## 2013-07-05 MED ORDER — MECLIZINE HCL 25 MG PO TABS
50.0000 mg | ORAL_TABLET | Freq: Once | ORAL | Status: AC
  Administered 2013-07-05: 50 mg via ORAL
  Filled 2013-07-05: qty 2

## 2013-07-05 MED ORDER — ASPIRIN EC 81 MG PO TBEC
81.0000 mg | DELAYED_RELEASE_TABLET | Freq: Every day | ORAL | Status: DC
  Administered 2013-07-06: 10:00:00 81 mg via ORAL
  Filled 2013-07-05: qty 1

## 2013-07-05 MED ORDER — LIDOCAINE HCL (PF) 1 % IJ SOLN
INTRAMUSCULAR | Status: AC
  Filled 2013-07-05: qty 30

## 2013-07-05 MED ORDER — FENTANYL CITRATE 0.05 MG/ML IJ SOLN
INTRAMUSCULAR | Status: AC
  Filled 2013-07-05: qty 2

## 2013-07-05 MED ORDER — SODIUM CHLORIDE 0.9 % IV SOLN
0.2500 mg/kg/h | INTRAVENOUS | Status: AC
  Filled 2013-07-05: qty 250

## 2013-07-05 MED ORDER — SODIUM CHLORIDE 0.9 % IV SOLN: INTRAVENOUS | Status: AC

## 2013-07-05 MED ORDER — TICAGRELOR 90 MG PO TABS
90.0000 mg | ORAL_TABLET | Freq: Two times a day (BID) | ORAL | Status: DC
  Administered 2013-07-06 (×2): 90 mg via ORAL
  Filled 2013-07-05 (×4): qty 1

## 2013-07-05 MED ORDER — SODIUM CHLORIDE 0.9 % IV BOLUS (SEPSIS)
1000.0000 mL | Freq: Once | INTRAVENOUS | Status: AC
  Administered 2013-07-05: 1000 mL via INTRAVENOUS

## 2013-07-05 MED ORDER — NITROGLYCERIN 0.4 MG SL SUBL
SUBLINGUAL_TABLET | SUBLINGUAL | Status: AC
  Filled 2013-07-05: qty 1

## 2013-07-05 MED ORDER — METOPROLOL TARTRATE 25 MG PO TABS
25.0000 mg | ORAL_TABLET | Freq: Two times a day (BID) | ORAL | Status: DC
  Administered 2013-07-05 – 2013-07-06 (×2): 25 mg via ORAL
  Filled 2013-07-05 (×4): qty 1

## 2013-07-05 MED ORDER — NITROGLYCERIN 0.4 MG SL SUBL: 0.4000 mg | SUBLINGUAL_TABLET | SUBLINGUAL | Status: DC | PRN

## 2013-07-05 MED ORDER — SODIUM CHLORIDE 0.9 % IV SOLN: Freq: Once | INTRAVENOUS | Status: DC

## 2013-07-05 MED ORDER — ASPIRIN 81 MG PO CHEW
81.0000 mg | CHEWABLE_TABLET | ORAL | Status: AC
  Administered 2013-07-05: 81 mg via ORAL
  Filled 2013-07-05: qty 1

## 2013-07-05 MED ORDER — SODIUM CHLORIDE 0.9 % IV SOLN
INTRAVENOUS | Status: AC
  Administered 2013-07-05: 12:00:00 via INTRAVENOUS

## 2013-07-05 MED ORDER — ATORVASTATIN CALCIUM 40 MG PO TABS
40.0000 mg | ORAL_TABLET | Freq: Every day | ORAL | Status: DC
Start: 2013-07-05 — End: 2013-07-06
  Administered 2013-07-05: 22:00:00 40 mg via ORAL
  Filled 2013-07-05 (×2): qty 1

## 2013-07-05 MED ORDER — HEPARIN (PORCINE) IN NACL 2-0.9 UNIT/ML-% IJ SOLN
INTRAMUSCULAR | Status: AC
Start: 2013-07-05 — End: 2013-07-05
  Filled 2013-07-05: qty 500

## 2013-07-05 MED ORDER — ONDANSETRON HCL 4 MG/2ML IJ SOLN: 4.0000 mg | Freq: Four times a day (QID) | INTRAMUSCULAR | Status: DC | PRN

## 2013-07-05 MED ORDER — MIDAZOLAM HCL 2 MG/2ML IJ SOLN
INTRAMUSCULAR | Status: AC
  Filled 2013-07-05: qty 2

## 2013-07-05 NOTE — Progress Notes (Signed)
Appreciate Dr. Thompson Caul help.  Decided to stop with cutting balloon PTCA in light of good results and also concern that might rough up prox stent trying to get a stent down.   I think he had vertigo last night.  CT scan of head is unremarkable.  Feeling better and no new neuro symptom or c/o.Cath site OK.  Will check lab in am and   Hopefully d/c if vertigo better.  Symptoms while balloon inflated were bilat arm and shoulder pain.  Kerry Hough MD [facility]

## 2013-07-05 NOTE — ED Notes (Signed)
MD at bedside. 

## 2013-07-05 NOTE — H&P (Signed)
History and Physical   Admit date: 07/05/2013 Name:  Tyler Sweeney Medical record number: [id number] DOB/Age:  [birth year]  53 y.o. male  Referring Physician:   [facility]  Primary Physician: Dr. Orpah Melter  Chief complaint/reason for admission: dizziness, chest pain and vomiting  HPI:  This 53 year old male is admitted to the hospital for evaluation of dizziness, chest pain and vomiting. The patient has a history of coronary artery disease and had an abnormal myocardial perfusion scan in September of 2014. Catheterization showed a subtotally occluded distal LAD and an occluded marginal branch and a 60% circumflex branch. He underwent stenting of the LAD with a drug-eluting stent and of the circumflex with a bifurcation stent by Dr. Tamala Julian. He did well following that had a myocardial perfusion scan done in December that showed improvement in his ejection fraction rising to 52% and had a negative scan for ischemia. He has had some atypical chest pain since then. 2 nights ago he awoke with a headache and also had some vague left-sided chest discomfort different from his previous angina. He also had some numbness and tingling of his hands and the symptoms lasted on and off. He called the next day with continued symptoms and had some vague numbness tingling and vague burning type pain and was seen at the [facility] emergency room yesterday. At that time he was asymptomatic and had an unremarkable EKG and negative enzymes and I spoke with the emergency room physician and he went home. He awoke with a feeling of dizziness and head spinning and has again some left-sided chest discomfort. This morning he began to belch and had significant vertigo and began to vomit which he normally does not do and stated that the symptoms resembled what he had previously. He again presented to the [place] emergency room with dizziness vomiting and some vague left chest pain. He also had belching similar to  his previous presentation. Enzymes were again negative and a CT scan of the head was negative except for an old lacunar infarction. He continued to complain of dizziness and was given intravenous Valium as well as meclizine. He was transferred here for further evaluation. At the present time he is not having chest pain. He is groggy and feels as if things are slow. He previously has been able to exercise.    Past Medical History  Diagnosis Date  . Intermediate coronary syndrome   . Chest pain, unspecified   . Personal history of other diseases of digestive system   . CAD (coronary artery disease) 11/24/2012    11/24/12 Cath normal LM, 99%LAD post diagonal, 100% 1rst OM with collaterals, 40% circumflex, no RCA Stent LAD Dr. Tamala Julian    . Hyperlipidemia 11/24/2012  . GERD (gastroesophageal reflux disease) 05/08/2010  . MI, old        Past Surgical History  Procedure Laterality Date  . Hernia repair    . Colonoscopy  11/02/2011    Procedure: COLONOSCOPY;  Surgeon: Winfield Cunas., MD;  Location: [facility];  Service: Endoscopy;  Laterality: N/A;  . Foot surgery     Allergies: has No Known Allergies.   Medications: Prior to Admission medications   Medication Sig Start Date End Date Taking? Authorizing Provider  aspirin 81 MG chewable tablet Chew 1 tablet (81 mg total) by mouth daily. 11/25/12   Candee Furbish, MD  metoprolol tartrate (LOPRESSOR) 25 MG tablet Take 25 mg by mouth 2 (two) times daily.    Historical Provider, MD  nitroGLYCERIN (NITROSTAT) 0.4 MG SL tablet Place 0.4 mg under the tongue every 5 (five) minutes as needed for chest pain.    Historical Provider, MD  omeprazole (PRILOSEC OTC) 20 MG tablet Take 20 mg by mouth daily.    Historical Provider, MD  Probiotic Product (ALIGN) 4 MG CAPS Take 1 tablet by mouth daily.    Historical Provider, MD  rosuvastatin (CRESTOR) 20 MG tablet Take 20 mg by mouth daily.    Historical Provider, MD  Ticagrelor (BRILINTA) 90 MG TABS tablet Take 1  tablet (90 mg total) by mouth 2 (two) times daily. 11/25/12   Candee Furbish, MD    Family History:  No family status information on file.    Social History:   reports that he has never smoked. He does not have any smokeless tobacco history on file. He reports that he does not drink alcohol or use illicit drugs.   History   Social History Narrative  . No narrative on file    Review of Systems:  Other than as noted above, the remainder of the review of systems is normal  Physical Exam: BP 121/77  Pulse 60  Temp(Src) 97.5 F (36.4 C) (Oral)  Resp 16  Ht 6' (1.829 m)  Wt 81.647 kg (180 lb)  BMI 24.41 kg/m2  SpO2 100%  BP 121/77  Pulse 60  Temp(Src) 97.5 F (36.4 C) (Oral)  Resp 16  Ht 6' (1.829 m)  Wt 81.647 kg (180 lb)  BMI 24.41 kg/m2  SpO2 100% General appearance: Lethargic WM in NAD Head: Normocephalic, without obvious abnormality, atraumatic Eyes: conjunctivae/corneas clear. PERRL, EOM's intact. Fundi not examined Neck: no adenopathy, no carotid bruit, no JVD and supple, symmetrical, trachea midline Lungs: clear to auscultation bilaterally Heart: regular rate and rhythm, S1, S2 normal, no murmur, click, rub or gallop Abdomen: soft, non-tender; bowel sounds normal; no masses,  no organomegaly Pelvic: deferred Extremities: extremities normal, atraumatic, no cyanosis or edema Pulses: 2+ and symmetric Skin: Skin color, texture, turgor normal. No rashes or lesions Neurologic: No nystagmus, lethargic, no focal deficits.  Labs: CBC  Recent Labs  07/05/13 0717  WBC 6.5  RBC 4.43  HGB 14.4  HCT 40.7  PLT 187  MCV 91.9  MCH 32.5  MCHC 35.4  RDW 12.6  LYMPHSABS 1.5  MONOABS 0.6  EOSABS 0.1  BASOSABS 0.0   CMP   Recent Labs  07/05/13 0717  NA 141  K 4.3  CL 106  CO2 22  GLUCOSE 139*  BUN 18  CREATININE 1.40*  CALCIUM 9.2  PROT 7.3  ALBUMIN 4.3  AST 41*  ALT 75*  ALKPHOS 107  BILITOT 0.5  GFRNONAA 56*  GFRAA 65*   BNP (last 3  results)  Recent Labs  07/04/13 1440  PROBNP 107.1   Cardiac Panel (last 3 results)  Recent Labs  07/04/13 1440 07/04/13 1700 07/05/13 0717  TROPONINI <0.30 <0.30 <0.30   EKG: No acute changes, sinus rhythm  Radiology: Normal   IMPRESSIONS: 1. Chest pain consistent with unstable angina 2. Acute vertigo and headache-possible peripheral vertigo 3. Coronary artery disease with previous stenting 4. Mild elevations of liver enzymes 5. Chronic kidney disease stage II 6. Hyperlipidemia  PLAN: Very difficult to assess. He was given intravenous Valium over in the emergency room. His EKG is nonacute he has had now 2 emergency room visits. The one last night it is somewhat difficult to sort out and he was accompanied with symptoms similar to his previous symptoms. We'll  plan catheterization to assess graft patency. His CT showed no acute infarct.  Cardiac catheterization was discussed with the patient including risks of myocardial infarction, death, stroke, bleeding, arrhythmia, dye allergy, or renal insufficiency. He understands and is willing to proceed. Possibility of percutaneous intervention at the same setting was also discussed with the patient including risks.   Signed: Kerry Hough MD [facility]  07/05/2013, 10:26 AM

## 2013-07-05 NOTE — ED Notes (Signed)
Pt returned from CT °

## 2013-07-05 NOTE — ED Notes (Signed)
Patient transported to CT 

## 2013-07-05 NOTE — Progress Notes (Signed)
UR completed Devian Bartolomei K. Clarine Elrod, RN, BSN, MSHL, CCM  07/05/2013 4:12 PM

## 2013-07-05 NOTE — ED Notes (Signed)
Chest pain last night and severe dizziness. Awakened this am with increased dizziness described as the room spinning, diaphoresis, pallor, and nausea/vomiting.

## 2013-07-05 NOTE — Progress Notes (Signed)
Site area: right groin  Site Prior to Removal:  Level 0  Pressure Applied For 20:00 MINUTES    Minutes Beginning at 20:20  Manual:   yes  Patient Status During Pull:  [id number]  Post Pull Groin Site:  Level 0  Post Pull Instructions Given:  yes  Post Pull Pulses Present:  yes  Dressing Applied:  yes  Comments:  Pt tolerated sheath removal without complications, will continue to monitor pt.

## 2013-07-05 NOTE — Care Management Note (Addendum)
  Page 2 of 2   07/06/2013     3:23:03 PM CARE MANAGEMENT NOTE 07/06/2013  Patient:  Tyler Sweeney, Tyler Sweeney   Account Number:  [id number]  Date Initiated:  07/05/2013  Documentation initiated by:  Mariann Laster  Subjective/Objective Assessment:   Admitted with CP, nausea, vomiting     Action/Plan:   CM to follow for disposition needs   Anticipated DC Date:  07/06/2013   Anticipated DC Plan:  [place]  CM consult  Medication Assistance      Choice offered to / List presented to:             Status of service:  Completed, signed off Medicare Important Message given?   (If response is "NO", the following Medicare IM given date fields will be blank) Date Medicare IM given:   Date Additional Medicare IM given:    Discharge Disposition:  HOME/SELF CARE  Per UR Regulation:  Reviewed for med. necessity/level of care/duration of stay  If discussed at [place] of Stay Meetings, dates discussed:    Comments:  Social:  53yo from home.  Family at bedside.  Benefits Check: Brilinta 90 mg by mouth twice per day coverage, co-pay, authorizations, deductibles, and preferred pharmacy. PER [employer] PT DOES NOT HAVE RX BENEFITS  CM Consult:  Medication Asssitance. Patient confirms medication coverage with [employer] Prescription Card. Patient provided copy of card to CM. CM  Filed in chart [facility] RxBIN [id number] Rx PCN ADV  RxGRP [place] [id number] ID [id number] Name Tyler Sweeney Patient confims he has coverage and already on Brilinta prior to this admission. Patient states he has never received the 30 day free option. CM provided card to patient and instructed to activate for verification prior to RX pick up at the pharmacy. Disposition Plan:  home / self care Tyia Binford RN, BSN, [place], CCM 07/05/2013

## 2013-07-05 NOTE — ED Notes (Signed)
Attempted to call report and RN unable to accept call now- number left

## 2013-07-05 NOTE — ED Notes (Signed)
Family at bedside. 

## 2013-07-05 NOTE — ED Notes (Signed)
Report called to Unit RN

## 2013-07-05 NOTE — CV Procedure (Signed)
     PERCUTANEOUS CORONARY INTERVENTION   Tyler Sweeney is a 53 y.o. male  INDICATION: Angina at rest   PROCEDURE: Angioplasty, mid circumflex   CONSENT: The risks, benefits, and details of the procedure were explained to the patient. Risks including death, MI, stroke, bleeding, limb ischemia, emergency CABG, renal failure and allergy were described and accepted by the patient.  Informed written consent was obtained prior to proceeding.  PROCEDURE TECHNIQUE:  After Xylocaine anesthesia a 57F sheath was placed in the right femoral artery in exchange for the 5 French diagnostic.   Coronary guiding shots were made using a 6 [place] CLS guide catheter. Antithrombotic therapy, Bivalirudin bolus and infusion, was begun and determined to be therapeutic by ACT. Antiplatelet therapy,Brilinta, was was continued chronically.  Guidewire was used to cross the stenosis in the mid circumflex. It was very little difficulty crossing the stenosis of prior bifurcation stenting. We then used a 2.5 x 6 mm long cutting balloon and for a 120 second balloon inflation this bilateral antecubital. Does note that his discomfort. The 85% stenosis in the midcircumflex was reduced to 0% with integrated 3 flow.  EQUIPMENT: 2.5 x 6 mm Cutting Balloon  CONTRAST:  Total of 55 cc.  COMPLICATIONS:  None.    ANGIOGRAPHIC RESULTS:   85-90% mid circumflex reduced to 0% with TIMI grade 3 flow   IMPRESSIONS:  Successful cutting balloon angioplasty of a focal eccentric mid circumflex stenosis just distal to previously stented proximal circumflex 85% to 0% with TIMI grade 3 flow.   RECOMMENDATION: .Continue aspirin and Brilinta releasing additional 12 months. Further management per Dr. Vergia Alcon III, MD 07/05/2013 2:14 PM

## 2013-07-05 NOTE — ED Notes (Signed)
[facility] at bedside preparing for transport 

## 2013-07-05 NOTE — CV Procedure (Signed)
Cardiac Catheterization Report   Tyler Sweeney    53 y.o.  male  DOB: 02-Feb-1961  MRN: [id number]  07/05/2013    PROCEDURE:  Left heart catheterization with selective coronary angiography, left ventriculogram.  INDICATIONS:  Unstable angina pectoris, prior coronary stent  The risks, benefits, and details of the procedure were explained to the patient.  The patient verbalized understanding and wanted to proceed.  Informed written consent was obtained.  PROCEDURE TECHNIQUE:  After Xylocaine anesthesia a 64F sheath was placed in the right femoral artery with a single anterior needle wall stick.   Left coronary angiography was done using a Judkins L4 guide catheter.  Right coronary angiography was done using a Judkins R4 guide catheter.  2 kg not performed because of his creatinine and the need for percutaneous intervention. Dr. Tamala Julian consulted with the we'll the patient was in the table and we agreed to proceed with percutaneous intervention. .   CONTRAST:  Total of 60 cc.  COMPLICATIONS:  None.    HEMODYNAMICS:  Aortic pressure was 120/70. Ventricle postcontrast 120/3-10. There was no gradient between the left ventricle and aorta.    ANGIOGRAPHIC DATA:    CORONARY ARTERIES:   Arise and distribute normally.  Right dominant. Mild coronary calcification is noted.  Left main coronary artery: Normal  Left anterior descending: Scattered irregularities proximally. The previously placed LAD stent is widely patent. There appears to be an ostial 50% stenosis of the diagonal that arises at the superior portion of the stent.  Circumflex coronary artery: Previously placed stents are widely patent. The stent into the marginal branch is widely patent. There is a mild 30% stenosis at the continuation branch of the circumflex. Just distal to the stents is an 80% eccentric stenosis in the circumflex prior to a second marginal and a third marginal branch.  Right  coronary artery: No significant disease. Collateral filling is seen of the distal septal perforator system  LEFT VENTRICULOGRAM: Not performed due to renal insufficiency   IMPRESSIONS:  1. Unstable angina with new stenosis distal to the previously placed stents in the circumflex continuation branch 2. Widely patent LAD stent and widely patent stent of the circumflex first marginal and continuation branch 3. Normal LVEDP  RECOMMENDATION:  Discussed with Dr. Tamala Julian while patient on the table. The patient has had new onset of unstable angina symptoms in addition had significant sweating last night although symptoms had some atypical features do to vertigo and some vomiting. Plan to proceed with percutaneous intervention of the stenosis in the circumflex. Discussed possibility of not getting stent down the artery and just being able to do percutaneous intervention with balloon  W. Tollie Eth, Brooke Bonito. MD [facility]

## 2013-07-05 NOTE — ED Provider Notes (Signed)
TIME SEEN: 7:15 AM  CHIEF COMPLAINT: Chest pain, shortness of breath, vomiting, diaphoresis, vertigo  HPI: Patient is a 53 y.o. M with history of significant family history of cardiac disease, hyperlipidemia, coronary artery disease status post heart catheterization 11/24/12 with a drug-eluting stent to his LAD and and bifurcation stenting of his proximal circumflex and first obtuse marginal who presents emergency department with complaints of burning, pressure-like substernal chest pain without radiation, shortness of breath. He states his pain has been intermittent since yesterday morning. Denies any aggravating or alleviating factors. He was seen in the emergency room yesterday and had 2 negative sets of cardiac enzymes was discharged home with plan to followup with his cardiologist today. Patient reports that he woke up at 3 AM with chest pain, shortness of breath and felt the room was spinning. He does state that his vertigo is worse with movement of his head. He has had nausea and diaphoresis. He states his pain feels different than his anginal equivalent but that today he started belching which was one of his anginal symptoms. No lower extremity pain or swelling. No history of PE or DVT. No headache, numbness or focal weakness. No ear pain, tinnitus or hearing loss. Denies fever or cough. Denies abdominal pain.  ROS: See HPI Constitutional: no fever  Eyes: no drainage  ENT: no runny nose   Cardiovascular:   chest pain  Resp:  SOB  GI:  vomiting GU: no dysuria Integumentary: no rash  Allergy: no hives  Musculoskeletal: no leg swelling  Neurological: no slurred speech ROS otherwise negative  PAST MEDICAL HISTORY/PAST SURGICAL HISTORY:  Past Medical History  Diagnosis Date  . Intermediate coronary syndrome   . Chest pain, unspecified   . Personal history of other diseases of digestive system   . CAD (coronary artery disease) 11/24/2012    11/24/12 Cath normal LM, 99%LAD post diagonal,  100% 1rst OM with collaterals, 40% circumflex, no RCA Stent LAD Dr. Tamala Julian    . Hyperlipidemia 11/24/2012  . GERD (gastroesophageal reflux disease) 05/08/2010  . MI, old     MEDICATIONS:  Prior to Admission medications   Medication Sig Start Date End Date Taking? Authorizing Provider  aspirin 81 MG chewable tablet Chew 1 tablet (81 mg total) by mouth daily. 11/25/12   Candee Furbish, MD  metoprolol tartrate (LOPRESSOR) 25 MG tablet Take 25 mg by mouth 2 (two) times daily.    Historical Provider, MD  nitroGLYCERIN (NITROSTAT) 0.4 MG SL tablet Place 0.4 mg under the tongue every 5 (five) minutes as needed for chest pain.    Historical Provider, MD  omeprazole (PRILOSEC OTC) 20 MG tablet Take 20 mg by mouth daily.    Historical Provider, MD  Probiotic Product (ALIGN) 4 MG CAPS Take 1 tablet by mouth daily.    Historical Provider, MD  rosuvastatin (CRESTOR) 20 MG tablet Take 20 mg by mouth daily.    Historical Provider, MD  Ticagrelor (BRILINTA) 90 MG TABS tablet Take 1 tablet (90 mg total) by mouth 2 (two) times daily. 11/25/12   Candee Furbish, MD    ALLERGIES:  No Known Allergies  SOCIAL HISTORY:  History  Substance Use Topics  . Smoking status: Never Smoker   . Smokeless tobacco: Not on file  . Alcohol Use: No    FAMILY HISTORY: Family History  Problem Relation Age of Onset  . Coronary artery disease    . Heart attack Father   . Heart attack Mother     EXAM: BP 125/92  Pulse 62  Temp(Src) 97.5 F (36.4 C) (Oral)  Resp 16  Ht 6' (1.829 m)  Wt 180 lb (81.647 kg)  BMI 24.41 kg/m2  SpO2 97% CONSTITUTIONAL: Alert and oriented and responds appropriately to questions. Appears uncomfortable, has a slightly ashen appearance, diaphoretic HEAD: Normocephalic EYES: Conjunctivae clear, PERRL, patient does have oral buccal nystagmus when checking extraocular movements ENT: normal nose; no rhinorrhea; moist mucous membranes; pharynx without lesions noted, TMs are clear bilaterally NECK:  Supple, no meningismus, no LAD  CARD: RRR; S1 and S2 appreciated; no murmurs, no clicks, no rubs, no gallops RESP: Normal chest excursion without splinting or tachypnea; breath sounds clear and equal bilaterally; no wheezes, no rhonchi, no rales,  ABD/GI: Normal bowel sounds; non-distended; soft, non-tender, no rebound, no guarding BACK:  The back appears normal and is non-tender to palpation, there is no CVA tenderness EXT: Normal ROM in all joints; non-tender to palpation; no edema; normal capillary refill; no cyanosis    SKIN: Patient appears slightly ashen, diaphoretic NEURO: Moves all extremities equally, strength 5/5 in all 4 extremity, sensation to light touch intact diffusely, cranial nerves II through XII intact, no dysmetria to finger-nose testing, unable to perform Dix-Hallpike testing as patient is too symptomatic PSYCH: The patient's mood and manner are appropriate. Grooming and personal hygiene are appropriate.  MEDICAL DECISION MAKING: Patient here with chest pain, shortness of breath, diaphoresis, vomiting and vertigo. It is difficult to ascertain if his diaphoresis and vomiting are secondary to his chest pain or rather possible peripheral vertigo as he does describe vertiginous symptoms are worse with head movement. I am not concerned for central vertigo today. Given his significant history of cardiac disease and patient's appearance today and that this is his second emergency department visit in less than 24 hours, I feel patient will need admission. Will obtain cardiac labs, chest x-ray. We'll give IV fluids, Zofran, meclizine, aspirin and reassess. EKG shows no new ischemic changes.  ED PROGRESS: 7:50 AM Patient seems to be improving, his color is improving as well as his diaphoresis. He is no longer vomiting. Labs pending. He still hemodynamically stable.  8:08 AM  Pt reports he is CP free without receiving nitroglycerin. His labs are unremarkable other than very mild elevation of  his AST, ALT, lipase. He has no epigastric or right upper quadrant tenderness on exam. Negative Murphy sign.  Troponin negative.  He reports his vertigo has improved but has not resolved. We'll give IV Valium. Discussed with Dr. Wynonia Lawman with cardiology who agrees with admission to step down for ACS rule out given he has a significant history of cardiac disease. Also discussed patient's vertigo. We'll obtain a CT of his head prior to transfer to [facility].  Pt is still hemodynamically stable and pain-free.  Given this is a mixed picture, will hold heparin at this time.  9:06 AM Pt has had his head CT but I am unable to view the image at this time as it has not crossed over.  I am not concerned for intracranial hemorrhage as patient has no headache has had no head injury is improving with medication for per for vertigo. I suspect that his symptoms of vertigo may be either due to his cardiac disease or peripheral in nature. [facility] is at bedside to transfer patient. He is improving symptomatically and is much more well-appearing and hemodynamically stable. Still chest pain free. I feel patient is safe to be transferred to [facility] for further treatment by his cardiologist.  EKG Interpretation  Date/Time:  Thursday July 05 2013 07:07:39 EDT Ventricular Rate:  56 PR Interval:  186 QRS Duration: 92 QT Interval:  430 QTC Calculation: 414 R Axis:   39 Text Interpretation:  Sinus bradycardia Low voltage QRS Cannot rule out Anterior infarct , age undetermined Abnormal ECG No significant change since March 2012 Confirmed by [place],  DO, KRISTEN [phone]) on 07/05/2013 7:17:22 AM        [place], DO 07/05/13 [phone]

## 2013-07-05 NOTE — ED Notes (Signed)
Report called to [place], RN

## 2013-07-05 NOTE — Interval H&P Note (Signed)
History and Physical Interval Note:  07/05/2013 12:52 PM   Patient seen and examined.  No interval change in history and exam since last note.  ? Mild vertigo still. No chest pain.  Stable for procedure.  Kerry Hough. MD [facility]  07/05/2013

## 2013-07-05 NOTE — ED Notes (Signed)
Patient transported to X-ray 

## 2013-07-06 LAB — BASIC METABOLIC PANEL
BUN: 18 mg/dL (ref 6–23)
CO2: 24 meq/L (ref 19–32)
Calcium: 8.8 mg/dL (ref 8.4–10.5)
Chloride: 108 mEq/L (ref 96–112)
Creatinine, Ser: 1.56 mg/dL — ABNORMAL HIGH (ref 0.50–1.35)
GFR calc Af Amer: 57 mL/min — ABNORMAL LOW (ref >90)
GFR calc non Af Amer: 49 mL/min — ABNORMAL LOW (ref >90)
Glucose, Bld: 79 mg/dL (ref 70–99)
Potassium: 4.5 mEq/L (ref 3.7–5.3)
SODIUM: 143 meq/L (ref 137–147)

## 2013-07-06 LAB — CBC
HCT: 36.5 % — ABNORMAL LOW (ref 39.0–52.0)
Hemoglobin: 12.8 g/dL — ABNORMAL LOW (ref 13.0–17.0)
MCH: 32.1 pg (ref 26.0–34.0)
MCHC: 35.1 g/dL (ref 30.0–36.0)
MCV: 91.5 fL (ref 78.0–100.0)
Platelets: 143 10*3/uL — ABNORMAL LOW (ref 150–400)
RBC: 3.99 MIL/uL — AB (ref 4.22–5.81)
RDW: 13.1 % (ref 11.5–15.5)
WBC: 5.8 10*3/uL (ref 4.0–10.5)

## 2013-07-06 LAB — HEMOGLOBIN A1C
HEMOGLOBIN A1C: 5.2 % (ref <5.7)
MEAN PLASMA GLUCOSE: 103 mg/dL (ref <117)

## 2013-07-06 MED ORDER — ROSUVASTATIN CALCIUM 20 MG PO TABS: 10.0000 mg | ORAL_TABLET | Freq: Every day | ORAL | Status: DC

## 2013-07-06 MED FILL — Sodium Chloride IV Soln 0.9%: INTRAVENOUS | Qty: 50 | Status: AC

## 2013-07-06 NOTE — Progress Notes (Signed)
CARDIAC REHAB PHASE I   PRE:  Rate/Rhythm: 1 SR  BP:  Supine:   Sitting: 134/71  Standing: 116/82   SaO2:   MODE:  Ambulation: 1000 ft   POST:  Rate/Rhythm: 73 SR  BP:  Supine:   Sitting: 136/78  Standing:    SaO2:  0855-0950 Pt tolerated ambulation well without c/o of cp, SOB or dizziness. VS stable Pt back to side of bed after walk with call light in reach. Completed discharge education with pt. He voices understanding. Pt agrees to [place]. CRP in [place], will send referral.  Rodney Langton RN 07/06/2013 9:47 AM

## 2013-07-06 NOTE — Discharge Summary (Signed)
Physician Discharge Summary  Patient ID: Tyler Sweeney MRN: [id number] DOB/AGE: 11/02/1960 53 y.o.  Admit date: 07/05/2013 Discharge date: 07/06/2013  Primary Physician:  Dr. Christella Noa  Primary Discharge Diagnosis:  1. Unstable angina pectoris  Secondary Discharge Diagnosis: 2. Coronary artery disease with previous drug-eluting stents in the LAD and  complex bifurcation stenting procedure of the circumflex both of which are widely patent this admission. 3. New disease in the continuation branch of the circumflex treated with Cutting Balloon angioplasty 4. Elevation of glucose noted on admission  5. Hyperlipidemia under treatment 6. Probable acute vertigo 7. Chronic kidney disease stage 2-3  Procedures:  Cardiac catheterization, percutaneous intervention of the circumflex with a cutting balloon angioplasty  Consults:  Dr. Allegra Grana Course: This 53 year old male has a history of percutaneous intervention of the LAD and circumflex coronary artery for progressive angina. He presented to the [place] emergency room and night prior to admission with what sounds like acute vertigo as well as chest discomfort associated with severe sweating. He was unsteady on his feet and when he presented to med Center he was given intravenous Valium as well as meclizine. Troponins were negative. He also had left-sided chest discomfort that was somewhat different than his previous discomfort as well as severe sweating that concerned him. The night prior to that he gone to med Center with chest discomfort and some mild shortness of breath and went home. His troponins were negative. In light of his progressive symptoms admission was advised.  The patient was transferred to the hospital would like to proceed with urgent catheterization that day in light of his symptoms. Catheterization showed a widely patent LAD stent, a widely patent stent into the first marginal branch in the circumflex  continuation branch. There was a severe 80% focal stenosis just distal to the stent in the circumflex. The right coronary artery had no significant disease and supplied collaterals to this and septal perforator branches. After consultation with interventional cardiologist Dr. Tamala Julian he proceeded with percutaneous intervention of the circumflex stenosis. He did angioplasty done with a cutting balloon angioplasty with an excellent result. We debated about whether or not to stent this area and because of diffuse disease following the focal stenosis elected to see what sort of long-term result that he would get with the cutting balloon angioplasty. His symptoms of dizziness resolved overnight and the next day he had no recurrent dizziness.  He had a CT scan that showed an old lacunar infarct at [place] and his dizziness eventually resolved. It was my clinical impression that he had significant acute vertigo on his emergency room visit but this had resolved. His unstable angina resolved.  Discharge Exam: Blood pressure 125/82, pulse 66, temperature 98.2 F (36.8 C), temperature source Oral, resp. rate 16, height 6' (1.829 m), weight 81.7 kg (180 lb 1.9 oz), SpO2 96.00%. Weight: 81.7 kg (180 lb 1.9 oz) Lungs clear, catheterization site clean and dry  Labs: CBC:   Lab Results  Component Value Date   WBC 5.8 07/06/2013   HGB 12.8* 07/06/2013   HCT 36.5* 07/06/2013   MCV 91.5 07/06/2013   PLT 143* 07/06/2013    CMP:  Recent Labs Lab 07/05/13 0717 07/06/13 0637  NA 141 143  K 4.3 4.5  CL 106 108  CO2 22 24  BUN 18 18  CREATININE 1.40* 1.56*  CALCIUM 9.2 8.8  PROT 7.3  --   BILITOT 0.5  --   ALKPHOS 107  --  ALT 75*  --   AST 41*  --   GLUCOSE 139* 79    Lipid Panel     Component Value Date/Time   CHOL  Value: 198        ATP III CLASSIFICATION:  <200     mg/dL   Desirable  200-239  mg/dL   Borderline High  >=240    mg/dL   High        05/08/2010 0340   TRIG 167* 05/08/2010 0340   HDL 33*  05/08/2010 0340   CHOLHDL 6.0 05/08/2010 0340   VLDL 33 05/08/2010 0340   LDLCALC  Value: 132        Total Cholesterol/HDL:CHD Risk Coronary Heart Disease Risk Table                     Men   Women  1/2 Average Risk   3.4   3.3  Average Risk       5.0   4.4  2 X Average Risk   9.6   7.1  3 X Average Risk  23.4   11.0        Use the calculated Patient Ratio above and the CHD Risk Table to determine the patient's CHD Risk.        ATP III CLASSIFICATION (LDL):  <100     mg/dL   Optimal  100-129  mg/dL   Near or Above                    Optimal  130-159  mg/dL   Borderline  160-189  mg/dL   High  >190     mg/dL   Very High* 05/08/2010 0340    Cardiac Enzymes:  Recent Labs  07/04/13 1700 07/05/13 0717 07/05/13 1048  TROPONINI <0.30 <0.30 <0.30    BNP (last 3 results)  Recent Labs  07/04/13 1440  PROBNP 107.1    Radiology: Normal chest x-ray, no acute disease  EKG: Normal sinus rhythm, no ischemic change  Discharge Medications:   Medication List         aspirin 81 MG chewable tablet  Chew 1 tablet (81 mg total) by mouth daily.     metoprolol tartrate 25 MG tablet  Commonly known as:  LOPRESSOR  Take 25 mg by mouth 2 (two) times daily.     nitroGLYCERIN 0.4 MG SL tablet  Commonly known as:  NITROSTAT  Place 0.4 mg under the tongue every 5 (five) minutes as needed for chest pain.     rosuvastatin 20 MG tablet  Commonly known as:  CRESTOR  Take 0.5 tablets (10 mg total) by mouth daily.     ticagrelor 90 MG Tabs tablet  Commonly known as:  BRILINTA  Take 1 tablet (90 mg total) by mouth 2 (two) times daily.       Followup plans and appointments: Followup Dr. Wynonia Lawman in one week  Time spent with patient to include physician time:  30 minutes  Signed: W. Doristine Church. MD [facility] 07/06/2013, 8:36 AM

## 2013-07-26 ENCOUNTER — Encounter (HOSPITAL_COMMUNITY)
Admission: RE | Admit: 2013-07-26 | Discharge: 2013-07-26 | Disposition: A | Discharge status: Home / Self Care | Payer: Managed Care, Other (non HMO) | Source: Ambulatory Visit | Attending: Cardiology | Admitting: Cardiology

## 2013-07-26 DIAGNOSIS — I200 Unstable angina: Secondary | ICD-10-CM | POA: Insufficient documentation

## 2013-07-26 DIAGNOSIS — I2510 Atherosclerotic heart disease of native coronary artery without angina pectoris: Secondary | ICD-10-CM | POA: Insufficient documentation

## 2013-07-26 DIAGNOSIS — E785 Hyperlipidemia, unspecified: Secondary | ICD-10-CM | POA: Insufficient documentation

## 2013-07-26 DIAGNOSIS — Z5189 Encounter for other specified aftercare: Secondary | ICD-10-CM | POA: Insufficient documentation

## 2013-07-26 DIAGNOSIS — R079 Chest pain, unspecified: Secondary | ICD-10-CM | POA: Insufficient documentation

## 2013-07-26 NOTE — Progress Notes (Signed)
Cardiac Rehab Medication Review by a Pharmacist  Does the patient  feel that his/her medications are working for him/her?  yes  Has the patient been experiencing any side effects to the medications prescribed?  Yes, sometimes gets brief shooting pain in chest after taking brilinta  Does the patient measure his/her own blood pressure or blood glucose at home?  no   Does the patient have any problems obtaining medications due to transportation or finances?   no  Understanding of regimen: good Understanding of indications: good Potential of compliance: good    Pharmacist comments: Patient has good adherence and seems to be doing well on his medications  Thank you, Vivia Ewing, PharmD Clinical Pharmacist - Resident Pager: [phone] Pharmacy: [phone] 07/26/2013 8:47 AM

## 2013-08-01 ENCOUNTER — Encounter (HOSPITAL_COMMUNITY)
Admission: RE | Admit: 2013-08-01 | Discharge: 2013-08-01 | Disposition: A | Discharge status: Home / Self Care | Payer: Managed Care, Other (non HMO) | Source: Ambulatory Visit | Attending: Cardiology | Admitting: Cardiology

## 2013-08-01 DIAGNOSIS — R079 Chest pain, unspecified: Secondary | ICD-10-CM | POA: Diagnosis not present

## 2013-08-01 DIAGNOSIS — E785 Hyperlipidemia, unspecified: Secondary | ICD-10-CM | POA: Diagnosis not present

## 2013-08-01 DIAGNOSIS — I2510 Atherosclerotic heart disease of native coronary artery without angina pectoris: Secondary | ICD-10-CM | POA: Diagnosis present

## 2013-08-01 DIAGNOSIS — I200 Unstable angina: Secondary | ICD-10-CM | POA: Diagnosis not present

## 2013-08-01 DIAGNOSIS — Z5189 Encounter for other specified aftercare: Secondary | ICD-10-CM | POA: Diagnosis not present

## 2013-08-01 NOTE — Progress Notes (Signed)
Pt in this morning for his first exercise session in phase II cardiac rehab.  Pt tolerated exercise with no complaints.  Monitor showed sr with no noted ectopy.  PHQ2 score 0. Pt is excited to be in rehab and is eager to learn more about his heart.  Pt reports his short term goal is to lose 5 pounds.  Will engage early in his participation with home exercise and attend Tuesdays nutrition classes.  Will continue to monitor his progress toward this goal. Pt long term goal is to exercise regularly and maintain weight loss.  Will update goal as it is achieved and monitor progress toward meeting this goal.  Maurice Small RN, BSN

## 2013-08-03 ENCOUNTER — Encounter (HOSPITAL_COMMUNITY)
Admission: RE | Admit: 2013-08-03 | Discharge: 2013-08-03 | Disposition: A | Discharge status: Home / Self Care | Payer: Managed Care, Other (non HMO) | Source: Ambulatory Visit | Attending: Cardiology | Admitting: Cardiology

## 2013-08-03 DIAGNOSIS — Z5189 Encounter for other specified aftercare: Secondary | ICD-10-CM | POA: Diagnosis not present

## 2013-08-06 ENCOUNTER — Encounter (HOSPITAL_COMMUNITY)
Admission: RE | Admit: 2013-08-06 | Discharge: 2013-08-06 | Disposition: A | Discharge status: Home / Self Care | Payer: Managed Care, Other (non HMO) | Source: Ambulatory Visit | Attending: Cardiology | Admitting: Cardiology

## 2013-08-06 DIAGNOSIS — E785 Hyperlipidemia, unspecified: Secondary | ICD-10-CM | POA: Insufficient documentation

## 2013-08-06 DIAGNOSIS — I2510 Atherosclerotic heart disease of native coronary artery without angina pectoris: Secondary | ICD-10-CM | POA: Insufficient documentation

## 2013-08-06 DIAGNOSIS — Z5189 Encounter for other specified aftercare: Secondary | ICD-10-CM | POA: Insufficient documentation

## 2013-08-08 ENCOUNTER — Encounter (HOSPITAL_COMMUNITY): Payer: Managed Care, Other (non HMO)

## 2013-08-10 ENCOUNTER — Encounter (HOSPITAL_COMMUNITY)
Admission: RE | Admit: 2013-08-10 | Discharge: 2013-08-10 | Disposition: A | Discharge status: Home / Self Care | Payer: Managed Care, Other (non HMO) | Source: Ambulatory Visit | Attending: Cardiology | Admitting: Cardiology

## 2013-08-10 NOTE — Progress Notes (Signed)
I have reviewed home exercise with Tyler Sweeney. The patient was advised to walk 2-4 days per week outside of CRP II for 30-45 minutes continuously.  Pt stated he is currently walking/jogging some at home.  Pt will also complete one additional day of hand weights outside of CRP II.  Progression of exercise prescription was discussed.  Reviewed THR, pulse, RPE, sign and symptoms, NTG use and when to call 911 or MD.  Pt voiced understanding.  Archie Endo, MS, ACSM RCEP 08/10/2013 8:13 AM

## 2013-08-13 ENCOUNTER — Encounter (HOSPITAL_COMMUNITY)
Admission: RE | Admit: 2013-08-13 | Discharge: 2013-08-13 | Disposition: A | Discharge status: Home / Self Care | Payer: Managed Care, Other (non HMO) | Source: Ambulatory Visit | Attending: Cardiology | Admitting: Cardiology

## 2013-08-15 ENCOUNTER — Encounter (HOSPITAL_COMMUNITY)
Admission: RE | Admit: 2013-08-15 | Discharge: 2013-08-15 | Disposition: A | Discharge status: Home / Self Care | Payer: Managed Care, Other (non HMO) | Source: Ambulatory Visit | Attending: Cardiology | Admitting: Cardiology

## 2013-08-17 ENCOUNTER — Encounter (HOSPITAL_COMMUNITY): Payer: Managed Care, Other (non HMO)

## 2013-08-20 ENCOUNTER — Encounter (HOSPITAL_COMMUNITY): Payer: Managed Care, Other (non HMO)

## 2013-08-22 ENCOUNTER — Encounter (HOSPITAL_COMMUNITY): Payer: Managed Care, Other (non HMO)

## 2013-08-23 ENCOUNTER — Telehealth (HOSPITAL_COMMUNITY): Payer: Self-pay | Admitting: *Deleted

## 2013-08-23 NOTE — Telephone Encounter (Signed)
Pt out from exercise last two sessions. Message left for recall on answering machine.

## 2013-08-24 ENCOUNTER — Encounter (HOSPITAL_COMMUNITY)
Admission: RE | Admit: 2013-08-24 | Discharge: 2013-08-24 | Disposition: A | Discharge status: Home / Self Care | Payer: Managed Care, Other (non HMO) | Source: Ambulatory Visit | Attending: Cardiology | Admitting: Cardiology

## 2013-08-24 NOTE — Progress Notes (Signed)
Tyler Sweeney 53 y.o. male Nutrition Note Spoke with pt.  Nutrition Survey reviewed with pt. Pt is following Step 2 of the Therapeutic Lifestyle Changes diet. Pt expressed understanding of the information reviewed. Pt aware of nutrition education classes offered.  Nutrition Diagnosis   Food-and nutrition-related knowledge deficit related to lack of exposure to information as related to diagnosis of: ? CVD ?   Nutrition Intervention   Benefits of adopting Therapeutic Lifestyle Changes discussed when Med[employer]s reviewed.   Pt to attend the Portion Distortion class   Pt given handouts for: ? Nutrition I class ? Nutrition II class   Continue client-centered nutrition education by RD, as part of interdisciplinary care.  Goal(s)   Pt to describe the potential benefits of adopting Therapeutic Lifestyle Changes   Pt to identify food quantities necessary to achieve: ? wt loss to a goal wt of 180 lb (81.8 kg) at graduation from cardiac rehab.   Monitor and Evaluate progress toward nutrition goal with team. Nutrition Risk:  Low   Derek Mound, M.Ed, RD, LDN, CDE 08/24/2013 7:45 AM

## 2013-08-27 ENCOUNTER — Encounter (HOSPITAL_COMMUNITY): Payer: Managed Care, Other (non HMO)

## 2013-08-29 ENCOUNTER — Encounter (HOSPITAL_COMMUNITY)
Admission: RE | Admit: 2013-08-29 | Discharge: 2013-08-29 | Disposition: A | Discharge status: Home / Self Care | Payer: Managed Care, Other (non HMO) | Source: Ambulatory Visit | Attending: Cardiology | Admitting: Cardiology

## 2013-08-31 ENCOUNTER — Encounter (HOSPITAL_COMMUNITY)
Admission: RE | Admit: 2013-08-31 | Discharge: 2013-08-31 | Disposition: A | Discharge status: Home / Self Care | Payer: Managed Care, Other (non HMO) | Source: Ambulatory Visit | Attending: Cardiology | Admitting: Cardiology

## 2013-09-03 ENCOUNTER — Encounter (HOSPITAL_COMMUNITY): Payer: Managed Care, Other (non HMO)

## 2013-09-05 ENCOUNTER — Encounter (HOSPITAL_COMMUNITY): Payer: Managed Care, Other (non HMO)

## 2013-09-05 ENCOUNTER — Telehealth (HOSPITAL_COMMUNITY): Payer: Self-pay | Admitting: Cardiac Rehabilitation

## 2013-09-05 NOTE — Telephone Encounter (Signed)
Message received from pt he will be absent from cardiac rehab today due to scheduled vacation

## 2013-09-07 ENCOUNTER — Encounter (HOSPITAL_COMMUNITY): Payer: Managed Care, Other (non HMO)

## 2013-09-10 ENCOUNTER — Encounter (HOSPITAL_COMMUNITY)
Admission: RE | Admit: 2013-09-10 | Discharge: 2013-09-10 | Disposition: A | Discharge status: Home / Self Care | Payer: Managed Care, Other (non HMO) | Source: Ambulatory Visit | Attending: Cardiology | Admitting: Cardiology

## 2013-09-10 DIAGNOSIS — E785 Hyperlipidemia, unspecified: Secondary | ICD-10-CM | POA: Insufficient documentation

## 2013-09-10 DIAGNOSIS — I2510 Atherosclerotic heart disease of native coronary artery without angina pectoris: Secondary | ICD-10-CM | POA: Insufficient documentation

## 2013-09-10 DIAGNOSIS — Z5189 Encounter for other specified aftercare: Secondary | ICD-10-CM | POA: Insufficient documentation

## 2013-09-12 ENCOUNTER — Encounter (HOSPITAL_COMMUNITY)
Admission: RE | Admit: 2013-09-12 | Discharge: 2013-09-12 | Disposition: A | Discharge status: Home / Self Care | Payer: Managed Care, Other (non HMO) | Source: Ambulatory Visit | Attending: Cardiology | Admitting: Cardiology

## 2013-09-14 ENCOUNTER — Encounter (HOSPITAL_COMMUNITY): Payer: Managed Care, Other (non HMO)

## 2013-09-17 ENCOUNTER — Encounter (HOSPITAL_COMMUNITY): Admission: RE | Admit: 2013-09-17 | Payer: Managed Care, Other (non HMO) | Source: Ambulatory Visit

## 2013-09-19 ENCOUNTER — Encounter (HOSPITAL_COMMUNITY): Payer: Managed Care, Other (non HMO)

## 2013-09-21 ENCOUNTER — Encounter (HOSPITAL_COMMUNITY): Payer: Managed Care, Other (non HMO)

## 2013-09-24 ENCOUNTER — Encounter (HOSPITAL_COMMUNITY): Payer: Managed Care, Other (non HMO)

## 2013-09-26 ENCOUNTER — Encounter (HOSPITAL_COMMUNITY): Payer: Managed Care, Other (non HMO)

## 2013-09-28 ENCOUNTER — Encounter (HOSPITAL_COMMUNITY): Payer: Managed Care, Other (non HMO)

## 2013-10-01 ENCOUNTER — Encounter (HOSPITAL_COMMUNITY): Payer: Managed Care, Other (non HMO)

## 2013-10-03 ENCOUNTER — Encounter (HOSPITAL_COMMUNITY): Payer: Managed Care, Other (non HMO)

## 2013-10-05 ENCOUNTER — Encounter (HOSPITAL_COMMUNITY): Payer: Managed Care, Other (non HMO)

## 2013-10-08 ENCOUNTER — Encounter (HOSPITAL_COMMUNITY): Payer: Managed Care, Other (non HMO)

## 2013-10-10 ENCOUNTER — Encounter (HOSPITAL_COMMUNITY): Payer: Managed Care, Other (non HMO)

## 2013-10-12 ENCOUNTER — Encounter (HOSPITAL_COMMUNITY): Payer: Managed Care, Other (non HMO)

## 2013-10-15 ENCOUNTER — Encounter (HOSPITAL_COMMUNITY): Payer: Managed Care, Other (non HMO)

## 2013-10-17 ENCOUNTER — Ambulatory Visit: Payer: Managed Care, Other (non HMO) | Admitting: Cardiology

## 2013-10-17 ENCOUNTER — Encounter (HOSPITAL_COMMUNITY): Payer: Managed Care, Other (non HMO)

## 2013-10-19 ENCOUNTER — Encounter (HOSPITAL_COMMUNITY): Payer: Managed Care, Other (non HMO)

## 2013-10-22 ENCOUNTER — Encounter (HOSPITAL_COMMUNITY): Payer: Managed Care, Other (non HMO)

## 2013-10-24 ENCOUNTER — Encounter (HOSPITAL_COMMUNITY): Payer: Managed Care, Other (non HMO)

## 2013-10-26 ENCOUNTER — Encounter (HOSPITAL_COMMUNITY): Payer: Managed Care, Other (non HMO)

## 2013-10-29 ENCOUNTER — Encounter (HOSPITAL_COMMUNITY): Payer: Managed Care, Other (non HMO)

## 2013-10-31 ENCOUNTER — Encounter (HOSPITAL_COMMUNITY): Payer: Managed Care, Other (non HMO)

## 2013-11-02 ENCOUNTER — Encounter (HOSPITAL_COMMUNITY): Payer: Managed Care, Other (non HMO)

## 2013-11-05 ENCOUNTER — Encounter (HOSPITAL_COMMUNITY): Payer: Managed Care, Other (non HMO)

## 2014-01-14 ENCOUNTER — Encounter: Payer: Self-pay | Admitting: Nurse Practitioner

## 2014-02-14 ENCOUNTER — Encounter (HOSPITAL_COMMUNITY): Payer: Self-pay | Admitting: Cardiology

## 2016-09-19 ENCOUNTER — Emergency Department (HOSPITAL_COMMUNITY): Payer: Commercial Managed Care - PPO

## 2016-09-19 ENCOUNTER — Emergency Department (HOSPITAL_COMMUNITY): Payer: Commercial Managed Care - PPO | Admitting: Anesthesiology

## 2016-09-19 ENCOUNTER — Encounter (HOSPITAL_COMMUNITY)
Admission: EM | Disposition: A | Discharge status: Home / Self Care | Payer: Self-pay | Source: Home / Self Care | Attending: Emergency Medicine

## 2016-09-19 ENCOUNTER — Encounter (HOSPITAL_COMMUNITY): Payer: Self-pay | Admitting: *Deleted

## 2016-09-19 ENCOUNTER — Observation Stay (HOSPITAL_COMMUNITY)
Admission: EM | Admit: 2016-09-19 | Discharge: 2016-09-20 | Disposition: A | Discharge status: Home / Self Care | Payer: Commercial Managed Care - PPO | Attending: General Surgery | Admitting: General Surgery

## 2016-09-19 DIAGNOSIS — Z9049 Acquired absence of other specified parts of digestive tract: Secondary | ICD-10-CM

## 2016-09-19 DIAGNOSIS — Q613 Polycystic kidney, unspecified: Secondary | ICD-10-CM | POA: Insufficient documentation

## 2016-09-19 DIAGNOSIS — K3580 Unspecified acute appendicitis: Secondary | ICD-10-CM | POA: Diagnosis present

## 2016-09-19 DIAGNOSIS — Z79899 Other long term (current) drug therapy: Secondary | ICD-10-CM | POA: Insufficient documentation

## 2016-09-19 DIAGNOSIS — Z955 Presence of coronary angioplasty implant and graft: Secondary | ICD-10-CM | POA: Insufficient documentation

## 2016-09-19 DIAGNOSIS — Z7982 Long term (current) use of aspirin: Secondary | ICD-10-CM | POA: Diagnosis not present

## 2016-09-19 DIAGNOSIS — E7800 Pure hypercholesterolemia, unspecified: Secondary | ICD-10-CM | POA: Diagnosis not present

## 2016-09-19 DIAGNOSIS — E785 Hyperlipidemia, unspecified: Secondary | ICD-10-CM | POA: Insufficient documentation

## 2016-09-19 DIAGNOSIS — I25110 Atherosclerotic heart disease of native coronary artery with unstable angina pectoris: Secondary | ICD-10-CM | POA: Insufficient documentation

## 2016-09-19 DIAGNOSIS — I252 Old myocardial infarction: Secondary | ICD-10-CM | POA: Insufficient documentation

## 2016-09-19 DIAGNOSIS — K353 Acute appendicitis with localized peritonitis, without perforation or gangrene: Secondary | ICD-10-CM

## 2016-09-19 DIAGNOSIS — R1031 Right lower quadrant pain: Secondary | ICD-10-CM

## 2016-09-19 HISTORY — DX: Acquired absence of other specified parts of digestive tract: Z90.49

## 2016-09-19 HISTORY — PX: LAPAROSCOPIC APPENDECTOMY: SHX408

## 2016-09-19 LAB — COMPREHENSIVE METABOLIC PANEL
ALT: 21 U/L (ref 17–63)
AST: 41 U/L (ref 15–41)
Albumin: 4.4 g/dL (ref 3.5–5.0)
Alkaline Phosphatase: 95 U/L (ref 38–126)
Anion gap: 9 (ref 5–15)
BILIRUBIN TOTAL: 1.3 mg/dL — AB (ref 0.3–1.2)
BUN: 27 mg/dL — ABNORMAL HIGH (ref 6–20)
CO2: 23 mmol/L (ref 22–32)
CREATININE: 1.74 mg/dL — AB (ref 0.61–1.24)
Calcium: 9.3 mg/dL (ref 8.9–10.3)
Chloride: 105 mmol/L (ref 101–111)
GFR calc Af Amer: 49 mL/min — ABNORMAL LOW (ref >60)
GFR, EST NON AFRICAN AMERICAN: 42 mL/min — AB (ref >60)
Glucose, Bld: 92 mg/dL (ref 65–99)
POTASSIUM: 4.9 mmol/L (ref 3.5–5.1)
Sodium: 137 mmol/L (ref 135–145)
TOTAL PROTEIN: 7.1 g/dL (ref 6.5–8.1)

## 2016-09-19 LAB — CBC
HCT: 42.1 % (ref 39.0–52.0)
Hemoglobin: 14.8 g/dL (ref 13.0–17.0)
MCH: 32.1 pg (ref 26.0–34.0)
MCHC: 35.2 g/dL (ref 30.0–36.0)
MCV: 91.3 fL (ref 78.0–100.0)
Platelets: 173 10*3/uL (ref 150–400)
RBC: 4.61 MIL/uL (ref 4.22–5.81)
RDW: 13 % (ref 11.5–15.5)
WBC: 13.1 10*3/uL — AB (ref 4.0–10.5)

## 2016-09-19 LAB — URINALYSIS, ROUTINE W REFLEX MICROSCOPIC
Bacteria, UA: NONE SEEN
Bilirubin Urine: NEGATIVE
GLUCOSE, UA: NEGATIVE mg/dL
Ketones, ur: NEGATIVE mg/dL
Leukocytes, UA: NEGATIVE
NITRITE: NEGATIVE
PH: 5 (ref 5.0–8.0)
Protein, ur: NEGATIVE mg/dL
Specific Gravity, Urine: 1.018 (ref 1.005–1.030)
Squamous Epithelial / LPF: NONE SEEN

## 2016-09-19 LAB — LIPASE, BLOOD: Lipase: 52 U/L — ABNORMAL HIGH (ref 11–51)

## 2016-09-19 SURGERY — APPENDECTOMY, LAPAROSCOPIC
Anesthesia: General | Site: Abdomen

## 2016-09-19 MED ORDER — MIDAZOLAM HCL 5 MG/5ML IJ SOLN
INTRAMUSCULAR | Status: DC | PRN
  Administered 2016-09-19: 2 mg via INTRAVENOUS

## 2016-09-19 MED ORDER — METRONIDAZOLE IN NACL 5-0.79 MG/ML-% IV SOLN
500.0000 mg | Freq: Three times a day (TID) | INTRAVENOUS | Status: DC
  Administered 2016-09-19: 500 mg via INTRAVENOUS
  Filled 2016-09-19 (×2): qty 100

## 2016-09-19 MED ORDER — BUPIVACAINE HCL 0.25 % IJ SOLN
INTRAMUSCULAR | Status: DC | PRN
  Administered 2016-09-19: 4 mL

## 2016-09-19 MED ORDER — SUCCINYLCHOLINE CHLORIDE 20 MG/ML IJ SOLN
INTRAMUSCULAR | Status: DC | PRN
  Administered 2016-09-19: 100 mg via INTRAVENOUS

## 2016-09-19 MED ORDER — SUGAMMADEX SODIUM 200 MG/2ML IV SOLN
INTRAVENOUS | Status: AC
  Filled 2016-09-19: qty 2

## 2016-09-19 MED ORDER — OXYCODONE HCL 5 MG PO TABS
5.0000 mg | ORAL_TABLET | ORAL | Status: DC | PRN
  Administered 2016-09-19 (×2): 10 mg via ORAL
  Filled 2016-09-19 (×3): qty 2

## 2016-09-19 MED ORDER — ONDANSETRON 4 MG PO TBDP: 4.0000 mg | ORAL_TABLET | Freq: Four times a day (QID) | ORAL | Status: DC | PRN

## 2016-09-19 MED ORDER — IOPAMIDOL (ISOVUE-300) INJECTION 61%
INTRAVENOUS | Status: AC
  Administered 2016-09-19: 75 mL
  Filled 2016-09-19: qty 75

## 2016-09-19 MED ORDER — OXYCODONE HCL 5 MG PO TABS: 5.0000 mg | ORAL_TABLET | Freq: Once | ORAL | Status: DC | PRN

## 2016-09-19 MED ORDER — ONDANSETRON HCL 4 MG/2ML IJ SOLN: 4.0000 mg | Freq: Four times a day (QID) | INTRAMUSCULAR | Status: DC | PRN

## 2016-09-19 MED ORDER — FENTANYL CITRATE (PF) 250 MCG/5ML IJ SOLN
INTRAMUSCULAR | Status: DC | PRN
  Administered 2016-09-19 (×2): 100 ug via INTRAVENOUS
  Administered 2016-09-19: 50 ug via INTRAVENOUS

## 2016-09-19 MED ORDER — PHENYLEPHRINE HCL 10 MG/ML IJ SOLN
INTRAVENOUS | Status: DC | PRN
  Administered 2016-09-19: 20 ug/min via INTRAVENOUS

## 2016-09-19 MED ORDER — LIDOCAINE HCL (CARDIAC) 20 MG/ML IV SOLN
INTRAVENOUS | Status: DC | PRN
  Administered 2016-09-19: 100 mg via INTRATRACHEAL

## 2016-09-19 MED ORDER — MIDAZOLAM HCL 2 MG/2ML IJ SOLN
INTRAMUSCULAR | Status: AC
Start: 2016-09-19 — End: 2016-09-19
  Filled 2016-09-19: qty 2

## 2016-09-19 MED ORDER — MORPHINE SULFATE (PF) 4 MG/ML IV SOLN
4.0000 mg | Freq: Once | INTRAVENOUS | Status: AC | PRN
  Administered 2016-09-19: 4 mg via INTRAVENOUS
  Filled 2016-09-19: qty 1

## 2016-09-19 MED ORDER — ROSUVASTATIN CALCIUM 10 MG PO TABS
10.0000 mg | ORAL_TABLET | Freq: Every day | ORAL | Status: DC
  Administered 2016-09-19 – 2016-09-20 (×2): 10 mg via ORAL
  Filled 2016-09-19 (×3): qty 1

## 2016-09-19 MED ORDER — SODIUM CHLORIDE 0.9 % IV SOLN
Freq: Once | INTRAVENOUS | Status: AC
  Administered 2016-09-19: 11:00:00 via INTRAVENOUS

## 2016-09-19 MED ORDER — SODIUM CHLORIDE 0.9 % IR SOLN
Status: DC | PRN
  Administered 2016-09-19: 1000 mL

## 2016-09-19 MED ORDER — ONDANSETRON HCL 4 MG/2ML IJ SOLN: 4.0000 mg | Freq: Once | INTRAMUSCULAR | Status: DC | PRN

## 2016-09-19 MED ORDER — BUPIVACAINE HCL (PF) 0.25 % IJ SOLN
INTRAMUSCULAR | Status: AC
  Filled 2016-09-19: qty 30

## 2016-09-19 MED ORDER — 0.9 % SODIUM CHLORIDE (POUR BTL) OPTIME
TOPICAL | Status: DC | PRN
  Administered 2016-09-19: 1000 mL

## 2016-09-19 MED ORDER — FENTANYL CITRATE (PF) 100 MCG/2ML IJ SOLN: 25.0000 ug | INTRAMUSCULAR | Status: DC | PRN

## 2016-09-19 MED ORDER — SUGAMMADEX SODIUM 500 MG/5ML IV SOLN
INTRAVENOUS | Status: DC | PRN
  Administered 2016-09-19: 300 mg via INTRAVENOUS

## 2016-09-19 MED ORDER — ROCURONIUM BROMIDE 50 MG/5ML IV SOLN
INTRAVENOUS | Status: AC
  Filled 2016-09-19: qty 1

## 2016-09-19 MED ORDER — DEXTROSE 5 % IV SOLN
2.0000 g | INTRAVENOUS | Status: DC
  Administered 2016-09-19: 2 g via INTRAVENOUS
  Filled 2016-09-19: qty 2

## 2016-09-19 MED ORDER — SODIUM CHLORIDE 0.9 % IV SOLN
INTRAVENOUS | Status: DC | PRN
  Administered 2016-09-19: 12:00:00 via INTRAVENOUS

## 2016-09-19 MED ORDER — PROPOFOL 10 MG/ML IV BOLUS
INTRAVENOUS | Status: AC
  Filled 2016-09-19: qty 20

## 2016-09-19 MED ORDER — ROCURONIUM BROMIDE 100 MG/10ML IV SOLN
INTRAVENOUS | Status: DC | PRN
  Administered 2016-09-19: 30 mg via INTRAVENOUS
  Administered 2016-09-19: 10 mg via INTRAVENOUS

## 2016-09-19 MED ORDER — METOPROLOL SUCCINATE ER 50 MG PO TB24
50.0000 mg | ORAL_TABLET | Freq: Every day | ORAL | Status: DC
  Administered 2016-09-19 – 2016-09-20 (×2): 50 mg via ORAL
  Filled 2016-09-19 (×3): qty 1

## 2016-09-19 MED ORDER — NITROGLYCERIN 0.4 MG SL SUBL: 0.4000 mg | SUBLINGUAL_TABLET | SUBLINGUAL | Status: DC | PRN

## 2016-09-19 MED ORDER — METRONIDAZOLE IN NACL 5-0.79 MG/ML-% IV SOLN
500.0000 mg | Freq: Three times a day (TID) | INTRAVENOUS | Status: AC
  Administered 2016-09-19 – 2016-09-20 (×2): 500 mg via INTRAVENOUS
  Filled 2016-09-19 (×2): qty 100

## 2016-09-19 MED ORDER — OXYCODONE HCL 5 MG/5ML PO SOLN: 5.0000 mg | Freq: Once | ORAL | Status: DC | PRN

## 2016-09-19 MED ORDER — CEFAZOLIN SODIUM-DEXTROSE 2-4 GM/100ML-% IV SOLN
INTRAVENOUS | Status: AC
  Filled 2016-09-19: qty 100

## 2016-09-19 MED ORDER — ONDANSETRON HCL 4 MG/2ML IJ SOLN
INTRAMUSCULAR | Status: DC | PRN
Start: 2016-09-19 — End: 2016-09-19
  Administered 2016-09-19: 4 mg via INTRAVENOUS

## 2016-09-19 MED ORDER — HYDROMORPHONE HCL 1 MG/ML IJ SOLN: 1.0000 mg | INTRAMUSCULAR | Status: DC | PRN

## 2016-09-19 MED ORDER — DEXTROSE-NACL 5-0.9 % IV SOLN
INTRAVENOUS | Status: DC
  Administered 2016-09-19 – 2016-09-20 (×2): via INTRAVENOUS

## 2016-09-19 MED ORDER — FENTANYL CITRATE (PF) 250 MCG/5ML IJ SOLN
INTRAMUSCULAR | Status: AC
  Filled 2016-09-19: qty 5

## 2016-09-19 MED ORDER — ONDANSETRON HCL 4 MG/2ML IJ SOLN
4.0000 mg | Freq: Once | INTRAMUSCULAR | Status: AC | PRN
  Administered 2016-09-19: 4 mg via INTRAVENOUS
  Filled 2016-09-19: qty 2

## 2016-09-19 SURGICAL SUPPLY — 43 items
APPLIER CLIP 5 13 M/L LIGAMAX5 (MISCELLANEOUS)
BENZOIN TINCTURE PRP APPL 2/3 (GAUZE/BANDAGES/DRESSINGS) ×3 IMPLANT
BLADE CLIPPER SURG (BLADE) ×3 IMPLANT
CANISTER SUCT 3000ML PPV (MISCELLANEOUS) ×3 IMPLANT
CHLORAPREP W/TINT 26ML (MISCELLANEOUS) ×3 IMPLANT
CLIP APPLIE 5 13 M/L LIGAMAX5 (MISCELLANEOUS) IMPLANT
CLOSURE WOUND 1/2 X4 (GAUZE/BANDAGES/DRESSINGS) ×1
COVER SURGICAL LIGHT HANDLE (MISCELLANEOUS) ×3 IMPLANT
COVER TRANSDUCER ULTRASND (DRAPES) ×3 IMPLANT
ELECT REM PT RETURN 9FT ADLT (ELECTROSURGICAL) ×3
ELECTRODE REM PT RTRN 9FT ADLT (ELECTROSURGICAL) ×1 IMPLANT
ENDOLOOP SUT PDS II  0 18 (SUTURE) ×6
ENDOLOOP SUT PDS II 0 18 (SUTURE) ×3 IMPLANT
GAUZE SPONGE 2X2 8PLY STRL LF (GAUZE/BANDAGES/DRESSINGS) ×1 IMPLANT
GAUZE SPONGE 4X4 12PLY STRL (GAUZE/BANDAGES/DRESSINGS) ×3 IMPLANT
GLOVE BIO SURGEON STRL SZ7.5 (GLOVE) ×3 IMPLANT
GOWN STRL REUS W/ TWL LRG LVL3 (GOWN DISPOSABLE) ×2 IMPLANT
GOWN STRL REUS W/ TWL XL LVL3 (GOWN DISPOSABLE) ×1 IMPLANT
GOWN STRL REUS W/TWL LRG LVL3 (GOWN DISPOSABLE) ×4
GOWN STRL REUS W/TWL XL LVL3 (GOWN DISPOSABLE) ×2
GRASPER SUT TROCAR 14GX15 (MISCELLANEOUS) ×3 IMPLANT
KIT BASIN OR (CUSTOM PROCEDURE TRAY) ×3 IMPLANT
KIT ROOM TURNOVER OR (KITS) ×3 IMPLANT
NEEDLE INSUFFLATION 14GA 120MM (NEEDLE) ×3 IMPLANT
NS IRRIG 1000ML POUR BTL (IV SOLUTION) ×3 IMPLANT
PAD ARMBOARD 7.5X6 YLW CONV (MISCELLANEOUS) ×6 IMPLANT
POUCH RETRIEVAL ECOSAC 10 (ENDOMECHANICALS) ×1 IMPLANT
POUCH RETRIEVAL ECOSAC 10MM (ENDOMECHANICALS) ×2
SCISSORS LAP 5X35 DISP (ENDOMECHANICALS) ×3 IMPLANT
SET IRRIG TUBING LAPAROSCOPIC (IRRIGATION / IRRIGATOR) ×3 IMPLANT
SLEEVE ENDOPATH XCEL 5M (ENDOMECHANICALS) ×3 IMPLANT
SPECIMEN JAR SMALL (MISCELLANEOUS) ×3 IMPLANT
SPONGE GAUZE 2X2 STER 10/PKG (GAUZE/BANDAGES/DRESSINGS) ×2
STRIP CLOSURE SKIN 1/2X4 (GAUZE/BANDAGES/DRESSINGS) ×2 IMPLANT
SUT MNCRL AB 4-0 PS2 18 (SUTURE) ×3 IMPLANT
TAPE CLOTH SURG 4X10 WHT LF (GAUZE/BANDAGES/DRESSINGS) ×3 IMPLANT
TOWEL OR 17X24 6PK STRL BLUE (TOWEL DISPOSABLE) IMPLANT
TOWEL OR 17X26 10 PK STRL BLUE (TOWEL DISPOSABLE) ×3 IMPLANT
TRAY FOLEY CATH SILVER 16FR (SET/KITS/TRAYS/PACK) IMPLANT
TRAY LAPAROSCOPIC MC (CUSTOM PROCEDURE TRAY) ×3 IMPLANT
TROCAR XCEL NON-BLD 11X100MML (ENDOMECHANICALS) ×3 IMPLANT
TROCAR XCEL NON-BLD 5MMX100MML (ENDOMECHANICALS) ×3 IMPLANT
TUBING INSUFFLATION (TUBING) ×3 IMPLANT

## 2016-09-19 NOTE — ED Notes (Signed)
York Cerise, PA at bedside at this time.

## 2016-09-19 NOTE — ED Provider Notes (Signed)
[facility] DEPT Provider Note   CSN: [id number] Arrival date & time: 09/19/16  0901     History   Chief Complaint Chief Complaint  Patient presents with  . Abdominal Pain    HPI Tyler Sweeney is a 56 y.o. male.  The history is provided by the patient and medical records. No language interpreter was used.  Abdominal Pain   Associated symptoms include nausea. Pertinent negatives include diarrhea, vomiting and constipation.   Tyler Sweeney is a 56 y.o. male  with a PMH of CAD, HLD who presents to the Emergency Department complaining of abdominal pain which began acutely last night. Pain initially was generalized and felt more like an upset stomach. He notes associated nausea, belching and gas. Gas improved this morning, but pain now feels sharp and more localized in the RLQ. He went to the urgent care earlier today who were concerned for appendicitis and sent him to ER for further evaluation. He has not had any episodes of vomiting. No fevers. Last ate last night. Did have a few sips of water on the way over here. No medications taken prior to arrival for symptoms. No history of similar sxs. No previous abdominal surgeries.  Past Medical History:  Diagnosis Date  . Anginal pain ([place])   . Asthma    "stopped at age 42"  . CAD (coronary artery disease) 11/24/2012   11/24/12 Cath normal LM, 99%LAD post diagonal, 100% 1rst OM with collaterals, 40% circumflex, no RCA Stent LAD Dr. Tamala Julian    . Hyperlipidemia 11/24/2012  . MI, old    "don't know when"  . Personal history of other diseases of digestive system   . Polycystic kidney disease    "dx'd but have never had any symptoms" (07/05/2013)    Patient Active Problem List   Diagnosis Date Noted  . S/P appendectomy 09/19/2016  . Chest pain 07/05/2013  . Unstable angina ([place]) 07/05/2013  . Vertigo   . CAD (coronary artery disease) 11/24/2012  . Hyperlipidemia 11/24/2012  . GERD (gastroesophageal reflux disease) 05/08/2010    Past  Surgical History:  Procedure Laterality Date  . COLONOSCOPY  11/02/2011   Procedure: COLONOSCOPY;  Surgeon: Winfield Cunas., MD;  Location: [facility];  Service: Endoscopy;  Laterality: N/A;  . CORONARY ANGIOPLASTY  07/05/2013  . CORONARY ANGIOPLASTY WITH STENT PLACEMENT  11/24/2012   "3 stents"  . FOOT SURGERY Left 1984   "cyst"  . INGUINAL HERNIA REPAIR Right 1962  . LEFT HEART CATHETERIZATION WITH CORONARY ANGIOGRAM N/A 11/24/2012   Procedure: LEFT HEART CATHETERIZATION WITH CORONARY ANGIOGRAM;  Surgeon: Jacolyn Reedy, MD;  Location: [facility] CATH LAB;  Service: Cardiovascular;  Laterality: N/A;  . LEFT HEART CATHETERIZATION WITH CORONARY ANGIOGRAM N/A 07/05/2013   Procedure: LEFT HEART CATHETERIZATION WITH CORONARY ANGIOGRAM;  Surgeon: Jacolyn Reedy, MD;  Location: [facility] CATH LAB;  Service: Cardiovascular;  Laterality: N/A;  . PERCUTANEOUS CORONARY STENT INTERVENTION (PCI-S)  11/24/2012   Procedure: PERCUTANEOUS CORONARY STENT INTERVENTION (PCI-S);  Surgeon: Jacolyn Reedy, MD;  Location: [facility] CATH LAB;  Service: Cardiovascular;;  . PERCUTANEOUS CORONARY STENT INTERVENTION (PCI-S) Right 07/05/2013   Procedure: PERCUTANEOUS CORONARY STENT INTERVENTION (PCI-S);  Surgeon: Jacolyn Reedy, MD;  Location: [facility] CATH LAB;  Service: Cardiovascular;  Laterality: Right;       Home Medications    Prior to Admission medications   Medication Sig Start Date End Date Taking? Authorizing Provider  aspirin 81 MG chewable tablet Chew 1 tablet (81 mg total) by mouth daily.  11/25/12  Yes Jerline Pain, MD  metoprolol succinate (TOPROL-XL) 50 MG 24 hr tablet Take 50 mg by mouth daily. Take with or immediately following a meal.   Yes [provider]  nitroGLYCERIN (NITROSTAT) 0.4 MG SL tablet Place 0.4 mg under the tongue every 5 (five) minutes as needed for chest pain.   Yes [provider]  rosuvastatin (CRESTOR) 20 MG tablet Take 10 mg by mouth daily. 07/06/13  Yes Jacolyn Reedy, MD    Ticagrelor (BRILINTA) 90 MG TABS tablet Take 1 tablet (90 mg total) by mouth 2 (two) times daily. Patient not taking: Reported on 09/19/2016 11/25/12   Jerline Pain, MD    Family History Family History  Problem Relation Age of Onset  . Coronary artery disease Unknown   . Heart attack Father   . Heart attack Mother     Social History Social History  Substance Use Topics  . Smoking status: Never Smoker  . Smokeless tobacco: Never Used  . Alcohol use No     Allergies   Shellfish allergy; Gluten meal; Milk-related compounds; and Ramipril   Review of Systems Review of Systems  Gastrointestinal: Positive for abdominal pain and nausea. Negative for blood in stool, constipation, diarrhea and vomiting.  All other systems reviewed and are negative.    Physical Exam Updated Vital Signs BP (!) 144/85   Pulse 91   Temp (!) 101.3 F (38.5 C)   Resp 16   Ht 5\' 10"  (1.778 m)   Wt 85.3 kg (188 lb)   SpO2 98%   BMI 26.98 kg/m   Physical Exam  Constitutional: He is oriented to person, place, and time. He appears well-developed and well-nourished. No distress.  HENT:  Head: Normocephalic and atraumatic.  Cardiovascular: Normal rate, regular rhythm and normal heart sounds.   No murmur heard. Pulmonary/Chest: Effort normal and breath sounds normal. No respiratory distress. He has no wheezes. He has no rales.  Abdominal: Soft. He exhibits no distension.  Focal area of tenderness at McBurney's point with guarding. + rovsing's sign.   Musculoskeletal: He exhibits no edema.  Neurological: He is alert and oriented to person, place, and time.  Skin: Skin is warm and dry.  Nursing note and vitals reviewed.    ED Treatments / Results  Labs (all labs ordered are listed, but only abnormal results are displayed) Labs Reviewed  LIPASE, BLOOD - Abnormal; Notable for the following:       Result Value   Lipase 52 (*)    All other components within normal limits  COMPREHENSIVE  METABOLIC PANEL - Abnormal; Notable for the following:    BUN 27 (*)    Creatinine, Ser 1.74 (*)    Total Bilirubin 1.3 (*)    GFR calc non Af Amer 42 (*)    GFR calc Af Amer 49 (*)    All other components within normal limits  CBC - Abnormal; Notable for the following:    WBC 13.1 (*)    All other components within normal limits  URINALYSIS, ROUTINE W REFLEX MICROSCOPIC - Abnormal; Notable for the following:    Hgb urine dipstick SMALL (*)    All other components within normal limits  SURGICAL PATHOLOGY    EKG  EKG Interpretation None       Radiology Ct Abdomen Pelvis W Contrast  Result Date: 09/19/2016 CLINICAL DATA:  Right lower quadrant pain since 10 p.m. last night. EXAM: CT ABDOMEN AND PELVIS WITH CONTRAST TECHNIQUE: Multidetector CT  imaging of the abdomen and pelvis was performed using the standard protocol following bolus administration of intravenous contrast. CONTRAST:  75 ml ISOVUE-300 IOPAMIDOL (ISOVUE-300) INJECTION 61% COMPARISON:  CT abdomen and pelvis 10/11/2011. FINDINGS: Lower chest: Mild dependent atelectasis is noted. No pleural or pericardial effusion. Hepatobiliary: Multiple hepatic cysts are unchanged. The gallbladder and biliary tree are unremarkable. Pancreas: Unremarkable. No pancreatic ductal dilatation or surrounding inflammatory changes. Spleen: Normal in size without focal abnormality. Adrenals/Urinary Tract: Innumerable bilateral renal cysts are unchanged. The kidneys are otherwise unremarkable. Ureters and urinary bladder appear normal. The adrenal glands are normal. Stomach/Bowel: The appendix is dilated at 1 cm with stranding about it consistent with acute appendicitis. No abscess. Diverticulosis without diverticulitis is most notable in the sigmoid. The colon otherwise appears normal. The stomach and small bowel appear normal. Vascular/Lymphatic: Aortic atherosclerosis. No enlarged abdominal or pelvic lymph nodes. Reproductive: Prostate is unremarkable.  Other: No fluid collection. Musculoskeletal: No acute bony abnormality. 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 IMPRESSION: Acute appendicitis without abscess. Polycystic kidney disease.  Multiple hepatic cysts also identified. Diverticulosis without diverticulitis. Paget's disease T11. These results were called by telephone at the time of interpretation on 09/19/2016 at 11:26 am to [place]., who verbally acknowledged these results. Electronically Signed   By: Inge Rise M.D.   On: 09/19/2016 11:29    Procedures Procedures (including critical care time)  Medications Ordered in ED Medications  cefTRIAXone (ROCEPHIN) 2 g in dextrose 5 % 50 mL IVPB ( Intravenous Automatically Held 09/27/16 1230)  metroNIDAZOLE (FLAGYL) IVPB 500 mg ( Intravenous Automatically Held 09/27/16 2030)  fentaNYL (SUBLIMAZE) injection 25-50 mcg (not administered)  ondansetron (ZOFRAN) injection 4 mg (not administered)  oxyCODONE (Oxy IR/ROXICODONE) immediate release tablet 5 mg (not administered)    Or  oxyCODONE (ROXICODONE) 5 MG/5ML solution 5 mg (not administered)  ondansetron (ZOFRAN) injection 4 mg (4 mg Intravenous Given 09/19/16 1004)  morphine 4 MG/ML injection 4 mg (4 mg Intravenous Given 09/19/16 1004)  0.9 %  sodium chloride infusion ( Intravenous New Bag/Given 09/19/16 1117)  iopamidol (ISOVUE-300) 61 % injection (75 mLs  Contrast Given 09/19/16 1051)     Initial Impression / Assessment and Plan / ED Course  I have reviewed the triage vital signs and the nursing notes.  Pertinent labs & imaging results that were available during my care of the patient were reviewed by me and considered in my medical decision making (see chart for details).    Tyler Sweeney is a 56 y.o. male who presents to ED for RLQ abdominal pain. Exam concerning for appendicitis. Labs reviewed. WBC count of 13.1 CT scan showing acute appendicitis. Surgery consulted who will admit.    Final Clinical Impressions(s) / ED Diagnoses     Final diagnoses:  RLQ abdominal pain  Acute appendicitis with localized peritonitis    New Prescriptions Current Discharge Medication List       Ward, Ozella Almond, PA-C 09/19/16 1416    Tegeler, Gwenyth Allegra, MD 09/20/16 1026

## 2016-09-19 NOTE — H&P (Signed)
Tyler Sweeney is an 56 y.o. male.   Chief Complaint: Abdominal pain HPI: Patient is a 67 -year-old male with approximately 12 hour history of abdominal pain that was initially generalized and radiated to the back. Patient states that this eventually localized to the right lower quadrant this morning. He says this was associated with nausea and however no emesis. Secondary to continued pain he proceeded to the ER for evaluation.  On evaluation ER he underwent laboratory studies and CT scan. WBCs were elevated. Patient had signs consistent with appendicitis on CT scan.  Past Medical History:  Diagnosis Date  . Anginal pain ([place])   . Asthma    "stopped at age 52"  . CAD (coronary artery disease) 11/24/2012   11/24/12 Cath normal LM, 99%LAD post diagonal, 100% 1rst OM with collaterals, 40% circumflex, no RCA Stent LAD Dr. Tamala Julian    . Hyperlipidemia 11/24/2012  . MI, old    "don't know when"  . Personal history of other diseases of digestive system   . Polycystic kidney disease    "dx'd but have never had any symptoms" (07/05/2013)    Past Surgical History:  Procedure Laterality Date  . COLONOSCOPY  11/02/2011   Procedure: COLONOSCOPY;  Surgeon: Winfield Cunas., MD;  Location: [facility];  Service: Endoscopy;  Laterality: N/A;  . CORONARY ANGIOPLASTY  07/05/2013  . CORONARY ANGIOPLASTY WITH STENT PLACEMENT  11/24/2012   "3 stents"  . FOOT SURGERY Left 1984   "cyst"  . INGUINAL HERNIA REPAIR Right 1962  . LEFT HEART CATHETERIZATION WITH CORONARY ANGIOGRAM N/A 11/24/2012   Procedure: LEFT HEART CATHETERIZATION WITH CORONARY ANGIOGRAM;  Surgeon: Jacolyn Reedy, MD;  Location: [facility] CATH LAB;  Service: Cardiovascular;  Laterality: N/A;  . LEFT HEART CATHETERIZATION WITH CORONARY ANGIOGRAM N/A 07/05/2013   Procedure: LEFT HEART CATHETERIZATION WITH CORONARY ANGIOGRAM;  Surgeon: Jacolyn Reedy, MD;  Location: [facility] CATH LAB;  Service: Cardiovascular;  Laterality: N/A;  . PERCUTANEOUS CORONARY STENT  INTERVENTION (PCI-S)  11/24/2012   Procedure: PERCUTANEOUS CORONARY STENT INTERVENTION (PCI-S);  Surgeon: Jacolyn Reedy, MD;  Location: [facility] CATH LAB;  Service: Cardiovascular;;  . PERCUTANEOUS CORONARY STENT INTERVENTION (PCI-S) Right 07/05/2013   Procedure: PERCUTANEOUS CORONARY STENT INTERVENTION (PCI-S);  Surgeon: Jacolyn Reedy, MD;  Location: [facility] CATH LAB;  Service: Cardiovascular;  Laterality: Right;    Family History  Problem Relation Age of Onset  . Coronary artery disease Unknown   . Heart attack Father   . Heart attack Mother    Social History:  reports that he has never smoked. He has never used smokeless tobacco. He reports that he does not drink alcohol or use drugs.  Allergies:  Allergies  Allergen Reactions  . Shellfish Allergy Anaphylaxis  . Gluten Meal Other (See Comments)    "can't have", "get inside vibration and get weaker"  . Milk-Related Compounds Other (See Comments)    Stomach issues, "get weaker"  . Ramipril Other (See Comments)    Head spinning and heart racing     (Not in a hospital admission)  Results for orders placed or performed during the hospital encounter of 09/19/16 (from the past 48 hour(s))  Lipase, blood     Status: Abnormal   Collection Time: 09/19/16  9:26 AM  Result Value Ref Range   Lipase 52 (H) 11 - 51 U/L  Comprehensive metabolic panel     Status: Abnormal   Collection Time: 09/19/16  9:26 AM  Result Value Ref Range   Sodium 137  135 - 145 mmol/L   Potassium 4.9 3.5 - 5.1 mmol/L    Comment: SLIGHT HEMOLYSIS   Chloride 105 101 - 111 mmol/L   CO2 23 22 - 32 mmol/L   Glucose, Bld 92 65 - 99 mg/dL   BUN 27 (H) 6 - 20 mg/dL   Creatinine, Ser 1.74 (H) 0.61 - 1.24 mg/dL   Calcium 9.3 8.9 - 10.3 mg/dL   Total Protein 7.1 6.5 - 8.1 g/dL   Albumin 4.4 3.5 - 5.0 g/dL   AST 41 15 - 41 U/L   ALT 21 17 - 63 U/L   Alkaline Phosphatase 95 38 - 126 U/L   Total Bilirubin 1.3 (H) 0.3 - 1.2 mg/dL   GFR calc non Af Amer 42 (L) >60 mL/min    GFR calc Af Amer 49 (L) >60 mL/min    Comment: (NOTE) The eGFR has been calculated using the CKD EPI equation. This calculation has not been validated in all clinical situations. eGFR's persistently <60 mL/min signify possible Chronic Kidney Disease.    Anion gap 9 5 - 15  CBC     Status: Abnormal   Collection Time: 09/19/16  9:26 AM  Result Value Ref Range   WBC 13.1 (H) 4.0 - 10.5 K/uL   RBC 4.61 4.22 - 5.81 MIL/uL   Hemoglobin 14.8 13.0 - 17.0 g/dL   HCT 42.1 39.0 - 52.0 %   MCV 91.3 78.0 - 100.0 fL   MCH 32.1 26.0 - 34.0 pg   MCHC 35.2 30.0 - 36.0 g/dL   RDW 13.0 11.5 - 15.5 %   Platelets 173 150 - 400 K/uL  Urinalysis, Routine w reflex microscopic     Status: Abnormal   Collection Time: 09/19/16 11:20 AM  Result Value Ref Range   Color, Urine YELLOW YELLOW   APPearance CLEAR CLEAR   Specific Gravity, Urine 1.018 1.005 - 1.030   pH 5.0 5.0 - 8.0   Glucose, UA NEGATIVE NEGATIVE mg/dL   Hgb urine dipstick SMALL (A) NEGATIVE   Bilirubin Urine NEGATIVE NEGATIVE   Ketones, ur NEGATIVE NEGATIVE mg/dL   Protein, ur NEGATIVE NEGATIVE mg/dL   Nitrite NEGATIVE NEGATIVE   Leukocytes, UA NEGATIVE NEGATIVE   RBC / HPF 0-5 0 - 5 RBC/hpf   WBC, UA 0-5 0 - 5 WBC/hpf   Bacteria, UA NONE SEEN NONE SEEN   Squamous Epithelial / LPF NONE SEEN NONE SEEN   Ct Abdomen Pelvis W Contrast  Result Date: 09/19/2016 CLINICAL DATA:  Right lower quadrant pain since 10 p.m. last night. EXAM: CT ABDOMEN AND PELVIS WITH CONTRAST TECHNIQUE: Multidetector CT imaging of the abdomen and pelvis was performed using the standard protocol following bolus administration of intravenous contrast. CONTRAST:  75 ml ISOVUE-300 IOPAMIDOL (ISOVUE-300) INJECTION 61% COMPARISON:  CT abdomen and pelvis 10/11/2011. FINDINGS: Lower chest: Mild dependent atelectasis is noted. No pleural or pericardial effusion. Hepatobiliary: Multiple hepatic cysts are unchanged. The gallbladder and biliary tree are unremarkable. Pancreas:  Unremarkable. No pancreatic ductal dilatation or surrounding inflammatory changes. Spleen: Normal in size without focal abnormality. Adrenals/Urinary Tract: Innumerable bilateral renal cysts are unchanged. The kidneys are otherwise unremarkable. Ureters and urinary bladder appear normal. The adrenal glands are normal. Stomach/Bowel: The appendix is dilated at 1 cm with stranding about it consistent with acute appendicitis. No abscess. Diverticulosis without diverticulitis is most notable in the sigmoid. The colon otherwise appears normal. The stomach and small bowel appear normal. Vascular/Lymphatic: Aortic atherosclerosis. No enlarged abdominal or pelvic lymph nodes. Reproductive:  Prostate is unremarkable. Other: No fluid collection. Musculoskeletal: No acute bony abnormality. 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 IMPRESSION: Acute appendicitis without abscess. Polycystic kidney disease.  Multiple hepatic cysts also identified. Diverticulosis without diverticulitis. Paget's disease T11. These results were called by telephone at the time of interpretation on 09/19/2016 at 11:26 am to [place]., who verbally acknowledged these results. Electronically Signed   By: Inge Rise M.D.   On: 09/19/2016 11:29    Review of Systems  Constitutional: Negative for chills, fever and malaise/fatigue.  HENT: Negative for ear discharge, hearing loss and sore throat.   Eyes: Negative for blurred vision and discharge.  Respiratory: Negative for cough and shortness of breath.   Cardiovascular: Negative for chest pain, orthopnea and leg swelling.  Gastrointestinal: Positive for abdominal pain and nausea. Negative for constipation, diarrhea, heartburn and vomiting.  Musculoskeletal: Negative for myalgias and neck pain.  Skin: Negative for itching and rash.  Neurological: Negative for dizziness, focal weakness, seizures and loss of consciousness.  Endo/Heme/Allergies: Negative for environmental allergies. Does not  bruise/bleed easily.  Psychiatric/Behavioral: Negative for depression and suicidal ideas.  All other systems reviewed and are negative.   Blood pressure 140/81, pulse 64, temperature 98.2 F (36.8 C), temperature source Oral, resp. rate 18, height 5' 10" (1.778 m), weight 85.3 kg (188 lb), SpO2 100 %. Physical Exam  Constitutional: He is oriented to person, place, and time. Vital signs are normal. He appears well-developed and well-nourished.  Conversant No acute distress  Eyes: Lids are normal. No scleral icterus.  No lid lag Moist conjunctiva  Neck: No tracheal tenderness present. No thyromegaly present.  No cervical lymphadenopathy  Cardiovascular: Normal rate, regular rhythm and intact distal pulses.   No murmur heard. Respiratory: Effort normal and breath sounds normal. He has no wheezes. He has no rales.  GI: Soft. Bowel sounds are normal. There is no hepatosplenomegaly. There is tenderness (RLQ). No hernia.  Musculoskeletal: Normal range of motion. He exhibits no edema, tenderness or deformity.  Neurological: He is alert and oriented to person, place, and time.  Normal gait and station  Skin: Skin is warm. No rash noted. No cyanosis. Nails show no clubbing.  Normal skin turgor  Psychiatric: Judgment normal.  Appropriate affect     Assessment/Plan 56 year old male with acute appendicitis Coronary artery disease Hypercholesterolemia  1. Patient currently has acute appendicitis we will proceed to the operating room urgently for Appendectomy. 2. I discussed with the patient the risks benefits of the procedure to include but not limited to: Infection, bleeding, damage to surrounding structures, possible ileus, possible postoperative infection. Patient voiced understanding and wishes to proceed.   Reyes Ivan, MD 09/19/2016, 12:17 PM

## 2016-09-19 NOTE — ED Triage Notes (Signed)
Pt sent here from UC to r/o appendicitis for RLQ pain since yesterday.

## 2016-09-19 NOTE — Anesthesia Procedure Notes (Signed)
Procedure Name: Intubation Date/Time: 09/19/2016 1:05 PM Performed by: Mariea Clonts Pre-anesthesia Checklist: Patient identified, Emergency Drugs available, Suction available and Patient being monitored Patient Re-evaluated:Patient Re-evaluated prior to induction Oxygen Delivery Method: Circle System Utilized Preoxygenation: Pre-oxygenation with 100% oxygen Induction Type: IV induction Ventilation: Mask ventilation without difficulty Laryngoscope Size: Miller and 2 Grade View: Grade II Tube type: Oral Tube size: 7.5 mm Number of attempts: 1 Airway Equipment and Method: Stylet and Oral airway Placement Confirmation: ETT inserted through vocal cords under direct vision,  positive ETCO2 and breath sounds checked- equal and bilateral Tube secured with: Tape Dental Injury: Teeth and Oropharynx as per pre-operative assessment

## 2016-09-19 NOTE — Anesthesia Preprocedure Evaluation (Addendum)
Anesthesia Evaluation  Patient identified by MRN, date of birth, ID band Patient awake    Reviewed: Allergy & Precautions, NPO status , Patient's Chart, lab work & pertinent test results  Airway Mallampati: II  TM Distance: >3 FB Neck ROM: Full    Dental  (+) Teeth Intact, Dental Advisory Given   Pulmonary    breath sounds clear to auscultation       Cardiovascular  Rhythm:Regular Rate:Normal     Neuro/Psych    GI/Hepatic   Endo/Other    Renal/GU      Musculoskeletal   Abdominal   Peds  Hematology   Anesthesia Other Findings   Reproductive/Obstetrics                            Anesthesia Physical Anesthesia Plan  ASA: III and emergent  Anesthesia Plan: General   Post-op Pain Management:    Induction: Intravenous, Rapid sequence and Cricoid pressure planned  PONV Risk Score and Plan:   Airway Management Planned: Oral ETT  Additional Equipment:   Intra-op Plan:   Post-operative Plan: Extubation in OR  Informed Consent: I have reviewed the patients History and Physical, chart, labs and discussed the procedure including the risks, benefits and alternatives for the proposed anesthesia with the patient or authorized representative who has indicated his/her understanding and acceptance.   Dental advisory given  Plan Discussed with: CRNA and Anesthesiologist  Anesthesia Plan Comments:        Anesthesia Quick Evaluation

## 2016-09-19 NOTE — Op Note (Signed)
09/19/2016  1:47 PM  PATIENT:  Tyler Sweeney  56 y.o. male  PRE-OPERATIVE DIAGNOSIS:  ACUTE APPENDICITIS  POST-OPERATIVE DIAGNOSIS:  ACUTE APPENDICITIS  PROCEDURE:  Procedure(s): APPENDECTOMY LAPAROSCOPIC (N/A)  SURGEON:  Surgeon(s) and Role:    Ralene Ok, MD - Primary  ANESTHESIA:   local and general  EBL:  5cc  BLOOD ADMINISTERED:none  DRAINS: none   LOCAL MEDICATIONS USED:  BUPIVICAINE   SPECIMEN:  Source of Specimen:  appendix  DISPOSITION OF SPECIMEN:  PATHOLOGY  COUNTS:  YES  TOURNIQUET:  * No tourniquets in log *  DICTATION: .Dragon Dictation Complications: none  Counts: reported as correct x 2  Findings:  The patient had a acutely inflamed non perforated appendix  Specimen: Appendix  Indications for procedure:  The patient is a 56 year old male with a history of periumbilical pain localized in the right lower quadrant patient had a CT scan which revealed signs consistent with acute appendicitis the patient back in for laparoscopic appendectomy.  Details of the procedure:The patient was taken back to the operating room. The patient was placed in supine position with bilateral SCDs in place. The patient was prepped and draped in the usual sterile fashion.  After appropriate anitbiotics were confirmed, a time-out was confirmed and all facts were verified.    A pneumoperitoneum of 14 mmHg was obtained via a Veress needle technique in the left lower quadrant quadrant.  A 5 mm trocar and 5 mm camera then placed intra-abdominally there is no injury to any intra-abdominal organs a 10 mm infraumbilical port was placed and direct visualization as was a 5 mm port in the suprapubic area.   The appendix was identified and seen to be  non-perforated.  The appendix was cleaned down to the appendiceal base. The mesoappendix was then incised and the appendiceal artery was cauterized.  The the appendiceal base was clean.  At this time an Endoloop was placed proximally  x2 and one distally and the appendix was transected between these 2. A retrieval bag was then placed into the abdomen and the specimen placed in the bag. The appendiceal stump was cauterized. We evacuate the fluid from the pelvis until the effluent was clear.  The appendix and retrieval  bag was then retrieved via the supraumbilical port. #1 Vicryl was used to reapproximate the fascia at the umbilical port site x1. The skin was reapproximated all port sites 3-0 Monocryl subcuticular fashion. The skin was dressed with steri-strips, guaze, and tape.  The patient had the foley removed. The patient was awakened from general anesthesia was taken to recovery room in stable condition.     PLAN OF CARE: Admit for overnight observation  PATIENT DISPOSITION:  PACU - hemodynamically stable.   Delay start of Pharmacological VTE agent (>24hrs) due to surgical blood loss or risk of bleeding: not applicable

## 2016-09-19 NOTE — Transfer of Care (Signed)
Immediate Anesthesia Transfer of Care Note  Patient: Tyler Sweeney  Procedure(s) Performed: Procedure(s): APPENDECTOMY LAPAROSCOPIC (N/A)  Patient Location: PACU  Anesthesia Type:General  Level of Consciousness: awake, alert  and oriented  Airway & Oxygen Therapy: Patient Spontanous Breathing and Patient connected to nasal cannula oxygen  Post-op Assessment: Report given to RN and Post -op Vital signs reviewed and stable  Post vital signs: Reviewed and stable  Last Vitals:  Vitals:   09/19/16 1130 09/19/16 1356  BP: 140/81   Pulse: 64   Resp:    Temp:  (!) 38.5 C    Last Pain:  Vitals:   09/19/16 1356  TempSrc:   PainSc: Asleep         Complications: No apparent anesthesia complications

## 2016-09-20 ENCOUNTER — Encounter (HOSPITAL_COMMUNITY): Payer: Self-pay | Admitting: General Surgery

## 2016-09-20 LAB — BASIC METABOLIC PANEL
ANION GAP: 7 (ref 5–15)
ANION GAP: 7 (ref 5–15)
BUN: 19 mg/dL (ref 6–20)
BUN: 18 mg/dL (ref 6–20)
CALCIUM: 8.4 mg/dL — AB (ref 8.9–10.3)
CALCIUM: 8.1 mg/dL — AB (ref 8.9–10.3)
CHLORIDE: 107 mmol/L (ref 101–111)
CHLORIDE: 107 mmol/L (ref 101–111)
CO2: 23 mmol/L (ref 22–32)
CO2: 22 mmol/L (ref 22–32)
Creatinine, Ser: 1.81 mg/dL — ABNORMAL HIGH (ref 0.61–1.24)
Creatinine, Ser: 1.69 mg/dL — ABNORMAL HIGH (ref 0.61–1.24)
GFR calc Af Amer: 47 mL/min — ABNORMAL LOW (ref >60)
GFR calc Af Amer: 51 mL/min — ABNORMAL LOW (ref >60)
GFR calc non Af Amer: 40 mL/min — ABNORMAL LOW (ref >60)
GFR calc non Af Amer: 44 mL/min — ABNORMAL LOW (ref >60)
GLUCOSE: 132 mg/dL — AB (ref 65–99)
GLUCOSE: 125 mg/dL — AB (ref 65–99)
Potassium: 3.9 mmol/L (ref 3.5–5.1)
Potassium: 3.8 mmol/L (ref 3.5–5.1)
Sodium: 137 mmol/L (ref 135–145)
Sodium: 136 mmol/L (ref 135–145)

## 2016-09-20 MED ORDER — OXYCODONE HCL 5 MG PO TABS: 5.0000 mg | ORAL_TABLET | ORAL | 0 refills | Status: DC | PRN

## 2016-09-20 MED ORDER — SODIUM CHLORIDE 0.9 % IV BOLUS (SEPSIS)
1000.0000 mL | Freq: Once | INTRAVENOUS | Status: AC
  Administered 2016-09-20: 1000 mL via INTRAVENOUS

## 2016-09-20 NOTE — Discharge Instructions (Signed)
Please arrive at least 30 min before your appointment to complete your check in paperwork.  If you are unable to arrive 30 min prior to your appointment time we may have to cancel or reschedule you.  LAPAROSCOPIC SURGERY: POST OP INSTRUCTIONS  1. DIET: Follow a light bland diet the first 24 hours after arrival home, such as soup, liquids, crackers, etc. Be sure to include lots of fluids daily. Avoid fast food or heavy meals as your are more likely to get nauseated. Eat a low fat the next few days after surgery.  2. Take your usually prescribed home medications unless otherwise directed. 3. PAIN CONTROL:  1. Pain is best controlled by a usual combination of three different methods TOGETHER:  1. Ice/Heat 2. Over the counter pain medication 3. Prescription pain medication 2. Most patients will experience some swelling and bruising around the incisions. Ice packs or heating pads (30-60 minutes up to 6 times a day) will help. Use ice for the first few days to help decrease swelling and bruising, then switch to heat to help relax tight/sore spots and speed recovery. Some people prefer to use ice alone, heat alone, alternating between ice & heat. Experiment to what works for you. Swelling and bruising can take several weeks to resolve.  3. It is helpful to take an over-the-counter pain medication regularly for the first few weeks. Choose one of the following that works best for you:  1. Naproxen (Aleve, etc) Two 220mg  tabs twice a day 2. Ibuprofen (Advil, etc) Three 200mg  tabs four times a day (every meal & bedtime) 3. Acetaminophen (Tylenol, etc) 500-650mg  four times a day (every meal & bedtime) 4. A prescription for pain medication (such as oxycodone, hydrocodone, etc) should be given to you upon discharge. Take your pain medication as prescribed.  1. If you are having problems/concerns with the prescription medicine (does not control pain, nausea, vomiting, rash, itching, etc), please call us [phone]  [phone] to see if we need to switch you to a different pain medicine that will work better for you and/or control your side effect better. 2. If you need a refill on your pain medication, please contact your pharmacy. They will contact our office to request authorization. Prescriptions will not be filled after 5 pm or on week-ends. 4. Avoid getting constipated. Between the surgery and the pain medications, it is common to experience some constipation. Increasing fluid intake and taking a fiber supplement (such as Metamucil, Citrucel, FiberCon, MiraLax, etc) 1-2 times a day regularly will usually help prevent this problem from occurring. A mild laxative (prune juice, Milk of Magnesia, MiraLax, etc) should be taken according to package directions if there are no bowel movements after 48 hours.  5. Watch out for diarrhea. If you have many loose bowel movements, simplify your diet to bland foods & liquids for a few days. Stop any stool softeners and decrease your fiber supplement. Switching to mild anti-diarrheal medications (Kayopectate, Pepto Bismol) can help. If this worsens or does not improve, please call us. 6. Remove your bandages 3 days after surgery. You may leave the incision open to air. You may replace a dressing/Band-Aid to cover the incision for comfort if you wish. You may shower 3 days after surgery when you remove the dressings. 7. ACTIVITIES as tolerated:  1. You may resume regular (light) daily activities beginning the next day--such as daily self-care, walking, climbing stairs--gradually increasing activities as tolerated. If you can walk 30 minutes without difficulty, it is safe to  try more intense activity such as jogging, treadmill, bicycling, low-impact aerobics, swimming, etc. 2. Save the most intensive and strenuous activity for last such as sit-ups, heavy lifting, contact sports, etc Refrain from any heavy lifting or straining until you are off narcotics for pain control.  3. DO NOT  PUSH THROUGH PAIN. Let pain be your guide: If it hurts to do something, don't do it. Pain is your body warning you to avoid that activity for another week until the pain goes down. 4. You may drive when you are no longer taking prescription pain medication, you can comfortably wear a seatbelt, and you can safely maneuver your car and apply brakes. 5. You may have sexual intercourse when it is comfortable.  8. Return to work:  You may return to work when you are no longer taking narcotic pain medication 9. FOLLOW UP in our office  1. Please call [facility] at [phone] to set up an appointment to see your surgeon in the office for a follow-up appointment approximately 2-3 weeks after your surgery. 2. Make sure that you call for this appointment the day you arrive home to insure a convenient appointment time.        IF YOU HAVE DISABILITY OR FAMILY LEAVE FORMS, BRING THEM TO THE               OFFICE FOR PROCESSING.    WHEN TO CALL us [phone]:  1. Poor pain control 2. Reactions / problems with new medications (rash/itching, nausea, etc)  3. Fever over 101.5 F (38.5 C) 4. Inability to urinate 5. Nausea and/or vomiting 6. Worsening swelling or bruising 7. Continued bleeding from incision. 8. Increased pain, redness, or drainage from the incision  The clinic staff is available to answer your questions during regular business hours (8:30am-5pm). Please dont hesitate to call and ask to speak to one of our nurses for clinical concerns.  If you have a medical emergency, go to the nearest emergency room or call 911.  A surgeon from [facility] is always on call at the [facility], [address], [place], [place], [place] [address] ?  MAIN: [phone] ? TOLL FREE: [phone] ?  FAX [phone]  [email or web address]

## 2016-09-20 NOTE — Progress Notes (Signed)
[facility]/Trauma Progress Note  1 Day Post-Op   Subjective:  CC: abdominal pain  Pt is tolerating diet, no vomiting or fevers. Has ambulated to urinate. He states he is feeling much better.   Objective: Vital signs in last 24 hours: Temp:  [98.8 F (37.1 C)-99.7 F (37.6 C)] 98.9 F (37.2 C) (07/16 1437) Pulse Rate:  [65-77] 77 (07/16 1437) Resp:  [16-18] 17 (07/16 1437) BP: (99-137)/(57-79) 137/79 (07/16 1437) SpO2:  [95 %-100 %] 100 % (07/16 1437) Last BM Date: 09/19/16  Intake/Output from previous day: 07/15 0701 - 07/16 0700 In: 1628.3 [P.O.:560; I.V.:1068.3] Out: 15 [Blood:15] Intake/Output this shift: Total I/O In: 480 [P.O.:480] Out: -   PE: General:  Alert, NAD, pleasant, cooperative, well appearing Cardio: RRR, S1 & S2 normal, no murmur, rubs, gallops Resp: Effort normal, lungs CTA bilaterally, no wheezes, rales, rhonchi Abd:  Soft, ND, normal bowel sounds, mild generalized TTP worse around incisions, incisions C/D/I  Skin: warm and dry  Lab Results:   Recent Labs  09/19/16 0926  WBC 13.1*  HGB 14.8  HCT 42.1  PLT 173   BMET  Recent Labs  09/19/16 0926 09/20/16 0841  NA 137 137  K 4.9 3.9  CL 105 107  CO2 23 23  GLUCOSE 92 132*  BUN 27* 19  CREATININE 1.74* 1.81*  CALCIUM 9.3 8.4*   PT/INR No results for input(s): LABPROT, INR in the last 72 hours. CMP     Component Value Date/Time   NA 137 09/20/2016 0841   K 3.9 09/20/2016 0841   CL 107 09/20/2016 0841   CO2 23 09/20/2016 0841   GLUCOSE 132 (H) 09/20/2016 0841   BUN 19 09/20/2016 0841   CREATININE 1.81 (H) 09/20/2016 0841   CALCIUM 8.4 (L) 09/20/2016 0841   PROT 7.1 09/19/2016 0926   ALBUMIN 4.4 09/19/2016 0926   AST 41 09/19/2016 0926   ALT 21 09/19/2016 0926   ALKPHOS 95 09/19/2016 0926   BILITOT 1.3 (H) 09/19/2016 0926   GFRNONAA 40 (L) 09/20/2016 0841   GFRAA 47 (L) 09/20/2016 0841   Lipase     Component Value Date/Time   LIPASE 52 (H) 09/19/2016  0926    Studies/Results: Ct Abdomen Pelvis W Contrast  Result Date: 09/19/2016 CLINICAL DATA:  Right lower quadrant pain since 10 p.m. last night. EXAM: CT ABDOMEN AND PELVIS WITH CONTRAST TECHNIQUE: Multidetector CT imaging of the abdomen and pelvis was performed using the standard protocol following bolus administration of intravenous contrast. CONTRAST:  75 ml ISOVUE-300 IOPAMIDOL (ISOVUE-300) INJECTION 61% COMPARISON:  CT abdomen and pelvis 10/11/2011. FINDINGS: Lower chest: Mild dependent atelectasis is noted. No pleural or pericardial effusion. Hepatobiliary: Multiple hepatic cysts are unchanged. The gallbladder and biliary tree are unremarkable. Pancreas: Unremarkable. No pancreatic ductal dilatation or surrounding inflammatory changes. Spleen: Normal in size without focal abnormality. Adrenals/Urinary Tract: Innumerable bilateral renal cysts are unchanged. The kidneys are otherwise unremarkable. Ureters and urinary bladder appear normal. The adrenal glands are normal. Stomach/Bowel: The appendix is dilated at 1 cm with stranding about it consistent with acute appendicitis. No abscess. Diverticulosis without diverticulitis is most notable in the sigmoid. The colon otherwise appears normal. The stomach and small bowel appear normal. Vascular/Lymphatic: Aortic atherosclerosis. No enlarged abdominal or pelvic lymph nodes. Reproductive: Prostate is unremarkable. Other: No fluid collection. Musculoskeletal: No acute bony abnormality. 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 IMPRESSION: Acute appendicitis without abscess. Polycystic kidney disease.  Multiple hepatic cysts also identified. Diverticulosis without diverticulitis. Paget's disease T11.  These results were called by telephone at the time of interpretation on 09/19/2016 at 11:26 am to [place]., who verbally acknowledged these results. Electronically Signed   By: Inge Rise M.D.   On: 09/19/2016 11:29    Anti-infectives: Anti-infectives     Start     Dose/Rate Route Frequency Ordered Stop   09/19/16 2000  metroNIDAZOLE (FLAGYL) IVPB 500 mg     500 mg 100 mL/hr over 60 Minutes Intravenous Every 8 hours 09/19/16 1449 09/20/16 0439   09/19/16 1230  cefTRIAXone (ROCEPHIN) 2 g in dextrose 5 % 50 mL IVPB  Status:  Discontinued     2 g 100 mL/hr over 30 Minutes Intravenous Every 24 hours 09/19/16 1222 09/19/16 1449   09/19/16 1230  metroNIDAZOLE (FLAGYL) IVPB 500 mg  Status:  Discontinued     500 mg 100 mL/hr over 60 Minutes Intravenous Every 8 hours 09/19/16 1222 09/19/16 1449       Assessment/Plan Polyscystic kidney disease - Baseline creatinine around 1.3 - 1.81 this AM - gave 1L of fluids and will recheck this afternoon  Appendicitis -  S/P lap appy, Dr. Rosendo Gros, 09/19/16  FEN: reg diet VTE: SCD's   DISPO: if creatinine comes down this afternoon pt can be discharged home    LOS: 0 days    Kalman Drape , [facility] 09/20/2016, 4:10 PM Pager: [phone] Consults: [phone] Mon-Fri 7:00 am-4:30 pm Sat-Sun 7:00 am-11:30 am

## 2016-09-20 NOTE — Discharge Summary (Signed)
[facility] Discharge Summary   Patient ID: Tyler Sweeney MRN: [id number] DOB/AGE: November 24, 1960 56 y.o.  Admit date: 09/19/2016 Discharge date: 09/20/2016  Admitting Diagnosis: appendicitis  Discharge Diagnosis Patient Active Problem List   Diagnosis Date Noted  . S/P appendectomy 09/19/2016  . Chest pain 07/05/2013  . Unstable angina ([place]) 07/05/2013  . Vertigo   . CAD (coronary artery disease) 11/24/2012  . Hyperlipidemia 11/24/2012  . GERD (gastroesophageal reflux disease) 05/08/2010    Consultants none  Imaging: Ct Abdomen Pelvis W Contrast  Result Date: 09/19/2016 CLINICAL DATA:  Right lower quadrant pain since 10 p.m. last night. EXAM: CT ABDOMEN AND PELVIS WITH CONTRAST TECHNIQUE: Multidetector CT imaging of the abdomen and pelvis was performed using the standard protocol following bolus administration of intravenous contrast. CONTRAST:  75 ml ISOVUE-300 IOPAMIDOL (ISOVUE-300) INJECTION 61% COMPARISON:  CT abdomen and pelvis 10/11/2011. FINDINGS: Lower chest: Mild dependent atelectasis is noted. No pleural or pericardial effusion. Hepatobiliary: Multiple hepatic cysts are unchanged. The gallbladder and biliary tree are unremarkable. Pancreas: Unremarkable. No pancreatic ductal dilatation or surrounding inflammatory changes. Spleen: Normal in size without focal abnormality. Adrenals/Urinary Tract: Innumerable bilateral renal cysts are unchanged. The kidneys are otherwise unremarkable. Ureters and urinary bladder appear normal. The adrenal glands are normal. Stomach/Bowel: The appendix is dilated at 1 cm with stranding about it consistent with acute appendicitis. No abscess. Diverticulosis without diverticulitis is most notable in the sigmoid. The colon otherwise appears normal. The stomach and small bowel appear normal. Vascular/Lymphatic: Aortic atherosclerosis. No enlarged abdominal or pelvic lymph nodes. Reproductive: Prostate is unremarkable. Other: No fluid  collection. Musculoskeletal: No acute bony abnormality. 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 IMPRESSION: Acute appendicitis without abscess. Polycystic kidney disease.  Multiple hepatic cysts also identified. Diverticulosis without diverticulitis. Paget's disease T11. These results were called by telephone at the time of interpretation on 09/19/2016 at 11:26 am to [place]., who verbally acknowledged these results. Electronically Signed   By: Inge Rise M.D.   On: 09/19/2016 11:29    Procedures Dr. Rosendo Gros (09/19/16) - Laparoscopic Appendectomy  Hospital Course:  Swade Shonka is a 56 year old male who presented to [facility] with abdominal pain.  Workup showed appendicitis.  Patient was admitted and underwent procedure listed above.  Tolerated procedure well and was transferred to the floor.  Diet was advanced as tolerated. Pt has a history of polycystic kidney disease and his baseline creatinine is around 1.3. His creatine was elevated upon admission but decreased with IVF prior to discharge.  On POD#1, the patient was voiding well, tolerating diet, ambulating well, pain well controlled, vital signs stable, incisions c/d/i and felt stable for discharge home.  Patient will follow up in our office in 2 weeks and knows to call with questions or concerns.  He will call to confirm appointment date/time.    Patient was discharged in good condition.  The [place] Substance controlled database was reviewed prior to prescribing narcotic pain medication to this patient.  Physical Exam: General:  Alert, NAD, pleasant, cooperative, well appearing Cardio: RRR, S1 & S2 normal, no murmur, rubs, gallops Resp: Effort normal, lungs CTA bilaterally, no wheezes, rales, rhonchi Abd:  Soft, ND, normal bowel sounds, mild generalized TTP worse around incisions, incisions C/D/I  Skin: warm and dry  Allergies as of 09/20/2016      Reactions   Shellfish Allergy Anaphylaxis   Gluten Meal Other (See  Comments)   "can't have", "get inside vibration and get weaker"   Milk-related Compounds  Other (See Comments)   Stomach issues, "get weaker"   Ramipril Other (See Comments)   Head spinning and heart racing      Medication List    TAKE these medications   aspirin 81 MG chewable tablet Chew 1 tablet (81 mg total) by mouth daily.   metoprolol succinate 50 MG 24 hr tablet Commonly known as:  TOPROL-XL Take 50 mg by mouth daily. Take with or immediately following a meal.   nitroGLYCERIN 0.4 MG SL tablet Commonly known as:  NITROSTAT Place 0.4 mg under the tongue every 5 (five) minutes as needed for chest pain.   oxyCODONE 5 MG immediate release tablet Commonly known as:  Oxy IR/ROXICODONE Take 1 tablet (5 mg total) by mouth every 4 (four) hours as needed for moderate pain.   rosuvastatin 20 MG tablet Commonly known as:  CRESTOR Take 10 mg by mouth daily.   ticagrelor 90 MG Tabs tablet Commonly known as:  BRILINTA Take 1 tablet (90 mg total) by mouth 2 (two) times daily.        Follow-up [facility], [place]. Call.   Specialty:  General Surgery Why:  to confirm your appointment date and time for your follow up appointment Contact information: [address] [place] [phone]          Signed: [facility] 09/20/2016, 8:47 AM Pager: [phone] Consults: [phone] Mon-Fri 7:00 am-4:30 pm Sat-Sun 7:00 am-11:30 am

## 2016-09-20 NOTE — Progress Notes (Signed)
Patient OOB ambulating unit hall X 2 this shift.

## 2016-09-20 NOTE — Progress Notes (Signed)
Pt discharged home this evening in stable condition. Discharge instructions given to pt with no concerns expressed. AVS and discharge script for pain medicine handed to pt before leaving unit

## 2016-09-24 NOTE — Anesthesia Postprocedure Evaluation (Signed)
Anesthesia Post Note  Patient: Tyler Sweeney  Procedure(s) Performed: Procedure(s) (LRB): APPENDECTOMY LAPAROSCOPIC (N/A)     Patient location during evaluation: PACU Anesthesia Type: General Level of consciousness: awake, awake and alert and oriented Pain management: pain level controlled Vital Signs Assessment: post-procedure vital signs reviewed and stable Respiratory status: spontaneous breathing, nonlabored ventilation and respiratory function stable Cardiovascular status: blood pressure returned to baseline Anesthetic complications: no    Last Vitals:  Vitals:   09/20/16 0622 09/20/16 1437  BP: 107/66 137/79  Pulse: 65 77  Resp: 18 17  Temp: 37.1 C 37.2 C    Last Pain:  Vitals:   09/20/16 1437  TempSrc: Oral  PainSc:                  Adrienna Karis COKER

## 2018-01-16 ENCOUNTER — Telehealth: Payer: Self-pay | Admitting: Cardiology

## 2018-01-16 ENCOUNTER — Other Ambulatory Visit: Payer: Self-pay

## 2018-01-16 MED ORDER — METOPROLOL SUCCINATE ER 50 MG PO TB24: 50.0000 mg | ORAL_TABLET | Freq: Every day | ORAL | 0 refills | Status: DC

## 2018-01-16 NOTE — Telephone Encounter (Signed)
° ° ° °  1. Which medications need to be refilled? (please list name of each medication and dose if known) metoprolol succ er 50mg  tablet 1QD  2. Which pharmacy/location (including street and city if local pharmacy) is medication to be sent to?[facility] [phone]  3. Do they need a 30 day or 90 day supply? [place]

## 2018-01-16 NOTE — Telephone Encounter (Signed)
Med refill for 30 days has been sent per RRR.

## 2018-02-08 ENCOUNTER — Telehealth: Payer: Self-pay | Admitting: Cardiology

## 2018-02-08 MED ORDER — METOPROLOL SUCCINATE ER 50 MG PO TB24: 50.0000 mg | ORAL_TABLET | Freq: Every day | ORAL | 0 refills | Status: DC

## 2018-02-08 NOTE — Telephone Encounter (Signed)
30 days sent in as requested. Has appointment with Dr. Bettina Gavia on 12/9

## 2018-02-12 NOTE — Progress Notes (Signed)
Cardiology Office Note:    Date:  02/13/2018   ID:  Tyler Sweeney, DOB [birth year], MRN [id number]  PCP:  Orpah Melter, MD  Cardiologist:  Shirlee More, MD    Referring MD: Orpah Melter, MD    ASSESSMENT:    1. Coronary artery disease of native artery of native heart with stable angina pectoris ([place])   2. Hyperlipidemia, unspecified hyperlipidemia type   3. CKD (chronic kidney disease) stage 2, GFR 60-89 ml/min    PLAN:    In order of problems listed above:  1. Stable chronic CAD New York Heart Association class I I would continue current medical treatment aspirin beta-blocker and statin at this time do not think he needs an ischemia evaluation or additional antianginal medications.  We will recheck his lipid profile and he may need additional treatment with Zetia along with his high intensity statin. 2. Check an advanced lipid profile LP(a) with paucity of risk factors and family history of heart disease if LP(a) is elevated greater than 50 and a statin he be a candidate for investigational drug trial with RNA modification. 3. Stable followed by his nephrologist   Next appointment: 1 year   Medication Adjustments/Labs and Tests Ordered: Current medicines are reviewed at length with the patient today.  Concerns regarding medicines are outlined above.  No orders of the defined types were placed in this encounter.  No orders of the defined types were placed in this encounter.   Chief Complaint  Patient presents with  . Follow-up  . Coronary Artery Disease  . Hyperlipidemia    History of Present Illness:    Tyler Sweeney is a 57 y.o. male with a hx of coronary artery disease with previous PCI and stent to the left anterior descending coronary artery and M1 in  2014 land PCI with cutting baloon to [facility] in 2015 for focal stenosis just distal to the M stent who was seen by Dr Wynonia Lawman 02/22/17.  I reviewed his records he had a moderate lipid abnormality at baseline really a  paucity of risk factors and family history of premature CAD and I asked him to undergo a NMR lipid profile and a LP(a) is is at risk for lipid disorders beyond traditional lipid profile.  He had labs drawn at work we will check other medical labs today in the office tells me his CKD is stable followed by nephrology and at times when he exercises has pain in the right shoulder but is had no anginal discomfort.  He continues to do cardio 45 minutes 5 days a week and is also added push-ups.  He has had no angina dyspnea palpitation or syncope stress is under control at work and is pleased with the quality of his life we reviewed the option of an ischemia evaluation and both of [place] think is not necessary at this time. Compliance with diet, lifestyle and medications: Yes Past Medical History:  Diagnosis Date  . Anginal pain ([place])   . Asthma    "stopped at age 67"  . CAD (coronary artery disease) 11/24/2012   11/24/12 Cath normal LM, 99%LAD post diagonal, 100% 1rst OM with collaterals, 40% circumflex, no RCA Stent LAD Dr. Tamala Julian    . Hyperlipidemia 11/24/2012  . MI, old    "don't know when"  . Personal history of other diseases of digestive system   . Polycystic kidney disease    "dx'd but have never had any symptoms" (07/05/2013)    Past Surgical History:  Procedure Laterality Date  .  COLONOSCOPY  11/02/2011   Procedure: COLONOSCOPY;  Surgeon: Winfield Cunas., MD;  Location: [facility];  Service: Endoscopy;  Laterality: N/A;  . CORONARY ANGIOPLASTY  07/05/2013  . CORONARY ANGIOPLASTY WITH STENT PLACEMENT  11/24/2012   "3 stents"  . FOOT SURGERY Left 1984   "cyst"  . INGUINAL HERNIA REPAIR Right 1962  . LAPAROSCOPIC APPENDECTOMY N/A 09/19/2016   Procedure: APPENDECTOMY LAPAROSCOPIC;  Surgeon: Ralene Ok, MD;  Location: [facility];  Service: General;  Laterality: N/A;  . LEFT HEART CATHETERIZATION WITH CORONARY ANGIOGRAM N/A 11/24/2012   Procedure: LEFT HEART CATHETERIZATION WITH CORONARY  ANGIOGRAM;  Surgeon: Jacolyn Reedy, MD;  Location: [facility] CATH LAB;  Service: Cardiovascular;  Laterality: N/A;  . LEFT HEART CATHETERIZATION WITH CORONARY ANGIOGRAM N/A 07/05/2013   Procedure: LEFT HEART CATHETERIZATION WITH CORONARY ANGIOGRAM;  Surgeon: Jacolyn Reedy, MD;  Location: [facility] CATH LAB;  Service: Cardiovascular;  Laterality: N/A;  . PERCUTANEOUS CORONARY STENT INTERVENTION (PCI-S)  11/24/2012   Procedure: PERCUTANEOUS CORONARY STENT INTERVENTION (PCI-S);  Surgeon: Jacolyn Reedy, MD;  Location: [facility] CATH LAB;  Service: Cardiovascular;;  . PERCUTANEOUS CORONARY STENT INTERVENTION (PCI-S) Right 07/05/2013   Procedure: PERCUTANEOUS CORONARY STENT INTERVENTION (PCI-S);  Surgeon: Jacolyn Reedy, MD;  Location: [facility] CATH LAB;  Service: Cardiovascular;  Laterality: Right;    Current Medications: Current Meds  Medication Sig  . aspirin 81 MG chewable tablet Chew 1 tablet (81 mg total) by mouth daily.  . metoprolol succinate (TOPROL-XL) 50 MG 24 hr tablet Take 1 tablet (50 mg total) by mouth daily. Take with or immediately following a meal.  . nitroGLYCERIN (NITROSTAT) 0.4 MG SL tablet Place 0.4 mg under the tongue every 5 (five) minutes as needed for chest pain.  . rosuvastatin (CRESTOR) 20 MG tablet Take 10 mg by mouth daily.     Allergies:   Shellfish allergy; Gluten meal; Milk-related compounds; and Ramipril   Social History   Socioeconomic History  . Marital status: Married    Spouse name: Not on file  . Number of children: Not on file  . Years of education: Not on file  . Highest education level: Not on file  Occupational History  . Not on file  Social Needs  . Financial resource strain: Not on file  . Food insecurity:    Worry: Not on file    Inability: Not on file  . Transportation needs:    Medical: Not on file    Non-medical: Not on file  Tobacco Use  . Smoking status: Never Smoker  . Smokeless tobacco: Never Used  Substance and Sexual Activity  . Alcohol use: No    . Drug use: No  . Sexual activity: Yes  Lifestyle  . Physical activity:    Days per week: Not on file    Minutes per session: Not on file  . Stress: Not on file  Relationships  . Social connections:    Talks on phone: Not on file    Gets together: Not on file    Attends religious service: Not on file    Active member of club or organization: Not on file    Attends meetings of clubs or organizations: Not on file    Relationship status: Not on file  Other Topics Concern  . Not on file  Social History Narrative  . Not on file     Family History: The patient's family history includes Coronary artery disease in his unknown relative; Heart attack in his father and mother.  ROS:   Please see the history of present illness.    All other systems reviewed and are negative.  EKGs/Labs/Other Studies Reviewed:    The following studies were reviewed today:  EKG:  EKG ordered today.  The ekg ordered today demonstrates sinus rhythm and is normal  Recent Lipid Panel   10/16/16 Chol 146 HDL 53 LDL 79 TG 48  Baseline lipids:   Specimen Collected: 05/08/10 03:40  Ref Range & Units 29yr ago  Cholesterol 0 - 200 mg/dL 198     ATP III CLASSIFICATION: <200   mg/dL  Desirable 200-239 mg/dL  Borderline High >=240  mg/dL  High     Triglycerides <150 mg/dL 167High    HDL >39 mg/dL 33Low    Total CHOL/HDL Ratio RATIO 6.0   VLDL 0 - 40 mg/dL 33   LDL Cholesterol 0 - 99 mLast Resulted: 05/08/10 06:17g/dL High   132                           Component Value Date/Time   CHOL  05/08/2010 0340    198        ATP III CLASSIFICATION:  <200     mg/dL   Desirable  200-239  mg/dL   Borderline High  >=240    mg/dL   High          TRIG 167 (H) 05/08/2010 0340   HDL 33 (L) 05/08/2010 0340   CHOLHDL 6.0 05/08/2010 0340   VLDL 33 05/08/2010 0340   LDLCALC (H) 05/08/2010 0340    132        Total Cholesterol/HDL:CHD Risk Coronary Heart Disease Risk Table                      Men   Women  1/2 Average Risk   3.4   3.3  Average Risk       5.0   4.4  2 X Average Risk   9.6   7.1  3 X Average Risk  23.4   11.0        Use the calculated Patient Ratio above and the CHD Risk Table to determine the patient's CHD Risk.        ATP III CLASSIFICATION (LDL):  <100     mg/dL   Optimal  100-129  mg/dL   Near or Above                    Optimal  130-159  mg/dL   Borderline  160-189  mg/dL   High  >190     mg/dL   Very High    Physical Exam:    VS:  BP 138/86 (BP Location: Right Arm, Patient Position: Sitting, Cuff Size: Normal)   Pulse 64   Ht 5\' 11"  (1.803 m)   Wt 186 lb 6.4 oz (84.6 kg)   SpO2 98%   BMI 26.00 kg/m     Wt Readings from Last 3 Encounters:  02/13/18 186 lb 6.4 oz (84.6 kg)  09/19/16 188 lb (85.3 kg)  07/26/13 184 lb 15.5 oz (83.9 kg)     GEN:  Well nourished, well developed in no acute distress HEENT: Normal NECK: No JVD; No carotid bruits LYMPHATICS: No lymphadenopathy CARDIAC: RRR, no murmurs, rubs, gallops RESPIRATORY:  Clear to auscultation without rales, wheezing or rhonchi  ABDOMEN: Soft, non-tender, non-distended MUSCULOSKELETAL:  No edema; No deformity  SKIN: Warm and dry NEUROLOGIC:  Alert and oriented x 3 PSYCHIATRIC:  Normal affect    Signed, Shirlee More, MD  02/13/2018 8:52 AM    [place]

## 2018-02-13 ENCOUNTER — Encounter: Payer: Self-pay | Admitting: Cardiology

## 2018-02-13 ENCOUNTER — Ambulatory Visit (INDEPENDENT_AMBULATORY_CARE_PROVIDER_SITE_OTHER): Payer: Commercial Managed Care - PPO | Admitting: Cardiology

## 2018-02-13 VITALS — BP 138/86 | HR 64 | Ht 71.0 in | Wt 186.4 lb

## 2018-02-13 DIAGNOSIS — N289 Disorder of kidney and ureter, unspecified: Secondary | ICD-10-CM | POA: Insufficient documentation

## 2018-02-13 DIAGNOSIS — N182 Chronic kidney disease, stage 2 (mild): Secondary | ICD-10-CM | POA: Diagnosis not present

## 2018-02-13 DIAGNOSIS — E785 Hyperlipidemia, unspecified: Secondary | ICD-10-CM | POA: Diagnosis not present

## 2018-02-13 DIAGNOSIS — I25118 Atherosclerotic heart disease of native coronary artery with other forms of angina pectoris: Secondary | ICD-10-CM

## 2018-02-13 HISTORY — DX: Disorder of kidney and ureter, unspecified: N28.9

## 2018-02-13 MED ORDER — NITROGLYCERIN 0.4 MG SL SUBL: 0.4000 mg | SUBLINGUAL_TABLET | SUBLINGUAL | 11 refills | Status: DC | PRN

## 2018-02-13 MED ORDER — ROSUVASTATIN CALCIUM 10 MG PO TABS: 10.0000 mg | ORAL_TABLET | Freq: Every day | ORAL | 3 refills | Status: DC

## 2018-02-13 MED ORDER — METOPROLOL SUCCINATE ER 50 MG PO TB24
50.0000 mg | ORAL_TABLET | Freq: Every day | ORAL | 3 refills | Status: DC
Start: 2018-02-13 — End: 2019-02-23

## 2018-02-13 NOTE — Patient Instructions (Addendum)
Medication Instructions:  Your physician recommends that you continue on your current medications as directed. Please refer to the Current Medication list given to you today.  If you need a refill on your cardiac medications before your next appointment, please call your pharmacy.   Lab work: Your physician recommends that you return for lab work today: lipid panel, lipoprotein A (LPA), NMR lipoprotein, CMP.   If you have labs (blood work) drawn today and your tests are completely normal, you will receive your results only by: Marland Kitchen MyChart Message (if you have MyChart) OR . A paper copy in the mail If you have any lab test that is abnormal or we need to change your treatment, we will call you to review the results.  Testing/Procedures: You had an EKG today.   Follow-Up: At [facility], you and your health needs are our priority.  As part of our continuing mission to provide you with exceptional heart care, we have created designated Provider Care Teams.  These Care Teams include your primary Cardiologist (physician) and Advanced Practice Providers (APPs -  Physician Assistants and Nurse Practitioners) who all work together to provide you with the care you need, when you need it. You will need a follow up appointment in 1 years.  Please call our office 2 months in advance to schedule this appointment.  You may see No primary care provider on file. or another member of our [employer] in [place]: Jenne Campus, MD . Jyl Heinz, MD  Any Other Special Instructions Will Be Listed Below (If Applicable).  **Please send a copy of your most recent cholesterol levels for Dr. Bettina Gavia to review!

## 2018-02-14 LAB — COMPREHENSIVE METABOLIC PANEL
A/G RATIO: 1.8 (ref 1.2–2.2)
ALBUMIN: 4.6 g/dL (ref 3.5–5.5)
ALT: 23 IU/L (ref 0–44)
AST: 27 IU/L (ref 0–40)
Alkaline Phosphatase: 89 IU/L (ref 39–117)
BUN/Creatinine Ratio: 13 (ref 9–20)
BUN: 18 mg/dL (ref 6–24)
Bilirubin Total: 0.7 mg/dL (ref 0.0–1.2)
CO2: 20 mmol/L (ref 20–29)
CREATININE: 1.43 mg/dL — AB (ref 0.76–1.27)
Calcium: 9.6 mg/dL (ref 8.7–10.2)
Chloride: 102 mmol/L (ref 96–106)
GFR, EST AFRICAN AMERICAN: 62 mL/min/{1.73_m2} (ref >59)
GFR, EST NON AFRICAN AMERICAN: 54 mL/min/{1.73_m2} — AB (ref >59)
GLOBULIN, TOTAL: 2.6 g/dL (ref 1.5–4.5)
Glucose: 83 mg/dL (ref 65–99)
Potassium: 4.6 mmol/L (ref 3.5–5.2)
Sodium: 140 mmol/L (ref 134–144)
Total Protein: 7.2 g/dL (ref 6.0–8.5)

## 2018-02-14 LAB — NMR, LIPOPROFILE
CHOLESTEROL, TOTAL: 148 mg/dL (ref 100–199)
HDL PARTICLE NUMBER: 32.4 umol/L (ref >=30.5)
HDL-C: 47 mg/dL (ref >39)
LDL Particle Number: 816 nmol/L (ref <1000)
LDL SIZE: 20.6 nm (ref >20.5)
LDL-C: 72 mg/dL (ref 0–99)
LP-IR Score: 67 — ABNORMAL HIGH (ref <=45)
SMALL LDL PARTICLE NUMBER: 294 nmol/L (ref <=527)
TRIGLYCERIDES: 146 mg/dL (ref 0–149)

## 2018-02-14 LAB — LIPID PANEL
CHOLESTEROL TOTAL: 149 mg/dL (ref 100–199)
Chol/HDL Ratio: 3 ratio (ref 0.0–5.0)
HDL: 49 mg/dL (ref >39)
LDL Calculated: 73 mg/dL (ref 0–99)
TRIGLYCERIDES: 134 mg/dL (ref 0–149)
VLDL Cholesterol Cal: 27 mg/dL (ref 5–40)

## 2018-02-14 LAB — LIPOPROTEIN A (LPA): LIPOPROTEIN (A): 82 nmol/L — AB (ref <75.0)

## 2018-12-28 ENCOUNTER — Ambulatory Visit: Payer: Commercial Managed Care - PPO | Admitting: Cardiology

## 2019-01-17 ENCOUNTER — Ambulatory Visit: Payer: Commercial Managed Care - PPO | Admitting: Cardiology

## 2019-02-12 ENCOUNTER — Other Ambulatory Visit: Payer: Self-pay | Admitting: Orthopaedic Surgery

## 2019-02-12 DIAGNOSIS — M19071 Primary osteoarthritis, right ankle and foot: Secondary | ICD-10-CM

## 2019-02-19 ENCOUNTER — Ambulatory Visit
Admission: RE | Admit: 2019-02-19 | Discharge: 2019-02-19 | Disposition: A | Discharge status: Home / Self Care | Payer: Commercial Managed Care - PPO | Source: Ambulatory Visit | Attending: Orthopaedic Surgery | Admitting: Orthopaedic Surgery

## 2019-02-19 DIAGNOSIS — M19071 Primary osteoarthritis, right ankle and foot: Secondary | ICD-10-CM

## 2019-02-19 NOTE — Progress Notes (Signed)
Cardiology Office Note:    Date:  02/20/2019   ID:  Tyler Sweeney, DOB [birth year], MRN [id number]  PCP:  Orpah Melter, MD  Cardiologist:  Shirlee More, MD    Referring MD: Orpah Melter, MD    ASSESSMENT:    1. Coronary artery disease of native artery of native heart with stable angina pectoris ([place])   2. Hyperlipidemia, unspecified hyperlipidemia type   3. Elevated blood pressure reading    PLAN:    In order of problems listed above:  1. Stable CAD continue medical therapy aspirin beta-blocker statin at this time I do not think he requires an ischemia evaluation New [place] Heart Association class I 2. He has elevated LP(a) needs intensification of treatment advised a second drug Zetia but wants to recheck his labs and if LPA remains greater than 50 and LDL cholesterol greater than 55 will initiate as an outpatient 3. Isolated reading of trend at home and if needed we will add a ARB for blood pressure control   Next appointment: 6 months   Medication Adjustments/Labs and Tests Ordered: Current medicines are reviewed at length with the patient today.  Concerns regarding medicines are outlined above.  No orders of the defined types were placed in this encounter.  No orders of the defined types were placed in this encounter.   Chief Complaint  Patient presents with  . Annual Exam  . Follow-up  . Coronary Artery Disease    History of Present Illness:     Tyler Sweeney is a 58 y.o. male with a hx of coronary artery disease with previous PCI and stent to the left anterior descending coronary artery and M1 in  2014 land PCI with cutting baloon to [facility] in 2015 for focal stenosis just distal to the M stent  last seen 02/13/2018. Compliance with diet, lifestyle and medications: Yes  He had a very stressful day at work today and thinks is the mechanism of his blood pressure being elevated he follows at home and is consistently less than 130/90.  We discussed intensifying  antihypertensive therapy and made a decision that in lieu of it he would monitor at home and contact me if remain greater than [id number] systolic.  No angina edema shortness of breath chest pain palpitation or syncope.  He understands his lipids are not ideal elevated LPA but wants to recheck labs before starting Zetia along with a statin.  He anticipates podiatry surgery for bunion right foot   Ref Range & Units 1 yr ago  Lipoprotein (a) <75.0 nmol/L 82.0    1 yr ago  (02/13/18) 1 yr ago  (02/13/18) 8 yr ago  (05/08/10)   LDL Particle Number <1,000 nmol/L 816     Comment:             Low          < 1000               Moderate     1000 - 1299               Borderline-High 1300 - 1599               High       1600 - 2000               Very High       > 2000   LDL-C 0 - 99 mg/dL 72     Comment:  Optimal        < 100               Above optimal   100 - 129               Borderline    130 - 159               High       160 - 189               Very high       > 189  LDL-C is inaccurate if patient is non-fasting.   HDL-C >39 mg/dL 47     Triglycerides 0 - 149 mg/dL 146  134  167High  R   Cholesterol, Total 100 - 199 mg/dL 148     HDL Particle Number >=30.5 umol/L 32.4     Small LDL Particle Number <=527 nmol/L 294     LDL Size >20.5 nm 20.6     Comment: ----------------------------------------------------------          ** INTERPRETATIVE INFORMATION**          PARTICLE CONCENTRATION AND SIZE            <--Lower CVD Risk  Higher CVD Risk-->   LDL AND HDL PARTICLES  Percentile in Reference Population   HDL-P (total)    High   75th  50th  25th  Low             >34.9  34.9  30.5  26.7  <26.7   Small LDL-P     Low   25th  50th   75th  High             <117   117   527   839  >839   LDL Size  <-Large (Pattern A)->  <-Small (Pattern B)->           23.0  20.6      20.5   19.0  ----------------------------------------------------------  Small LDL-P and LDL Size are associated with CVD risk, but not after  LDL-P is taken into account.   LP-IR Score <=45 67High      Comment: INSULIN RESISTANCE MARKER    <--Insulin Sensitive  Insulin Resistant-->      Percentile in Reference Population  Insulin Resistance Score  LP-IR Score  Low  25th  50th  75th  High         <27  27   45   63   >63  LP-IR Score is inaccurate if patient is non-fasting.  The LP-IR score is a laboratory developed index that has been  associated with insulin resistance and diabetes risk and should be  used as one component of a physician's clinical assessment.     Past Medical History:  Diagnosis Date  . Anginal pain ([place])   . Asthma    "stopped at age 43"  . CAD (coronary artery disease) 11/24/2012   11/24/12 Cath normal LM, 99%LAD post diagonal, 100% 1rst OM with collaterals, 40% circumflex, no RCA Stent LAD Dr. Tamala Julian    . Hyperlipidemia 11/24/2012  . MI, old    "don't know when"  . Personal history of other diseases of digestive system   . Polycystic kidney disease    "dx'd but have never had any symptoms" (07/05/2013)    Past Surgical History:  Procedure Laterality Date  . COLONOSCOPY  11/02/2011   Procedure: COLONOSCOPY;  Surgeon: Winfield Cunas.,  MD;  Location: [facility];  Service: Endoscopy;  Laterality: N/A;  . CORONARY ANGIOPLASTY  07/05/2013  . CORONARY ANGIOPLASTY WITH STENT PLACEMENT  11/24/2012   "3 stents"  . FOOT SURGERY Left 1984   "cyst"  . INGUINAL HERNIA REPAIR Right 1962  . LAPAROSCOPIC APPENDECTOMY N/A 09/19/2016   Procedure: APPENDECTOMY LAPAROSCOPIC;  Surgeon: Ralene Ok, MD;  Location: [place];  Service: General;  Laterality: N/A;  .  LEFT HEART CATHETERIZATION WITH CORONARY ANGIOGRAM N/A 11/24/2012   Procedure: LEFT HEART CATHETERIZATION WITH CORONARY ANGIOGRAM;  Surgeon: Jacolyn Reedy, MD;  Location: [facility] CATH LAB;  Service: Cardiovascular;  Laterality: N/A;  . LEFT HEART CATHETERIZATION WITH CORONARY ANGIOGRAM N/A 07/05/2013   Procedure: LEFT HEART CATHETERIZATION WITH CORONARY ANGIOGRAM;  Surgeon: Jacolyn Reedy, MD;  Location: [facility] CATH LAB;  Service: Cardiovascular;  Laterality: N/A;  . PERCUTANEOUS CORONARY STENT INTERVENTION (PCI-S)  11/24/2012   Procedure: PERCUTANEOUS CORONARY STENT INTERVENTION (PCI-S);  Surgeon: Jacolyn Reedy, MD;  Location: [facility] CATH LAB;  Service: Cardiovascular;;  . PERCUTANEOUS CORONARY STENT INTERVENTION (PCI-S) Right 07/05/2013   Procedure: PERCUTANEOUS CORONARY STENT INTERVENTION (PCI-S);  Surgeon: Jacolyn Reedy, MD;  Location: [facility] CATH LAB;  Service: Cardiovascular;  Laterality: Right;    Current Medications: Current Meds  Medication Sig  . aspirin 81 MG chewable tablet Chew 1 tablet (81 mg total) by mouth daily.  . metoprolol succinate (TOPROL-XL) 50 MG 24 hr tablet Take 1 tablet (50 mg total) by mouth daily. Take with or immediately following a meal.  . nitroGLYCERIN (NITROSTAT) 0.4 MG SL tablet Place 1 tablet (0.4 mg total) under the tongue every 5 (five) minutes as needed for chest pain.  . rosuvastatin (CRESTOR) 10 MG tablet Take 1 tablet (10 mg total) by mouth daily.     Allergies:   Shellfish allergy, Gluten meal, Milk-related compounds, and Ramipril   Social History   Socioeconomic History  . Marital status: Married    Spouse name: Not on file  . Number of children: Not on file  . Years of education: Not on file  . Highest education level: Not on file  Occupational History  . Not on file  Tobacco Use  . Smoking status: Never Smoker  . Smokeless tobacco: Never Used  Substance and Sexual Activity  . Alcohol use: No  . Drug use: No  . Sexual activity: Yes  Other  Topics Concern  . Not on file  Social History Narrative  . Not on file   Social Determinants of Health   Financial Resource Strain:   . Difficulty of Paying Living Expenses: Not on file  Food Insecurity:   . Worried About Charity fundraiser in the Last Year: Not on file  . Ran Out of Food in the Last Year: Not on file  Transportation Needs:   . Lack of Transportation (Medical): Not on file  . Lack of Transportation (Non-Medical): Not on file  Physical Activity:   . Days of Exercise per Week: Not on file  . Minutes of Exercise per Session: Not on file  Stress:   . Feeling of Stress : Not on file  Social Connections:   . Frequency of Communication with Friends and Family: Not on file  . Frequency of Social Gatherings with Friends and Family: Not on file  . Attends Religious Services: Not on file  . Active Member of Clubs or Organizations: Not on file  . Attends Archivist Meetings: Not on file  . Marital Status:  Not on file     Family History: The patient's family history includes Coronary artery disease in an other family member; Heart attack in his father and mother. ROS:   Please see the history of present illness.    All other systems reviewed and are negative.  EKGs/Labs/Other Studies Reviewed:    The following studies were reviewed today:  EKG:  EKG ordered today and personally reviewed.  The ekg ordered today demonstrates sinus rhythm QS in V2 consider old anteroseptal MI  Recent Labs: No results found for requested labs within last 8760 hours.  Recent Lipid Panel    Component Value Date/Time   CHOL 149 02/13/2018 0906   TRIG 134 02/13/2018 0906   HDL 49 02/13/2018 0906   CHOLHDL 3.0 02/13/2018 0906   CHOLHDL 6.0 05/08/2010 0340   VLDL 33 05/08/2010 0340   LDLCALC 73 02/13/2018 0906    Physical Exam:    VS:  BP (!) 168/100 (BP Location: Right Arm, Patient Position: Sitting, Cuff Size: Normal)   Pulse (!) 57   Ht 5\' 11"  (1.803 m)   Wt 193 lb  (87.5 kg)   SpO2 100%   BMI 26.92 kg/m     Wt Readings from Last 3 Encounters:  02/20/19 193 lb (87.5 kg)  02/13/18 186 lb 6.4 oz (84.6 kg)  09/19/16 188 lb (85.3 kg)  Repeat blood pressure by me 162/90  GEN:  Well nourished, well developed in no acute distress HEENT: Normal NECK: No JVD; No carotid bruits LYMPHATICS: No lymphadenopathy CARDIAC: RRR, no murmurs, rubs, gallops RESPIRATORY:  Clear to auscultation without rales, wheezing or rhonchi  ABDOMEN: Soft, non-tender, non-distended MUSCULOSKELETAL:  No edema; No deformity  SKIN: Warm and dry NEUROLOGIC:  Alert and oriented x 3 PSYCHIATRIC:  Normal affect    Signed, Shirlee More, MD  02/20/2019 4:23 PM    [facility]

## 2019-02-20 ENCOUNTER — Other Ambulatory Visit: Payer: Self-pay

## 2019-02-20 ENCOUNTER — Ambulatory Visit (INDEPENDENT_AMBULATORY_CARE_PROVIDER_SITE_OTHER): Payer: Commercial Managed Care - PPO | Admitting: Cardiology

## 2019-02-20 ENCOUNTER — Encounter: Payer: Self-pay | Admitting: Cardiology

## 2019-02-20 VITALS — BP 168/100 | HR 57 | Ht 71.0 in | Wt 193.0 lb

## 2019-02-20 DIAGNOSIS — E785 Hyperlipidemia, unspecified: Secondary | ICD-10-CM

## 2019-02-20 DIAGNOSIS — R030 Elevated blood-pressure reading, without diagnosis of hypertension: Secondary | ICD-10-CM

## 2019-02-20 DIAGNOSIS — I25118 Atherosclerotic heart disease of native coronary artery with other forms of angina pectoris: Secondary | ICD-10-CM | POA: Diagnosis not present

## 2019-02-20 NOTE — Addendum Note (Signed)
Addended by: Austin Miles on: 02/20/2019 04:31 PM   Modules accepted: Orders

## 2019-02-20 NOTE — Patient Instructions (Addendum)
Medication Instructions:  Your physician recommends that you continue on your current medications as directed. Please refer to the Current Medication list given to you today.  *If you need a refill on your cardiac medications before your next appointment, please call your pharmacy*  Lab Work: Your physician recommends that you return for lab work today: lipid panel, lipoprotein A (LPa).   If you have labs (blood work) drawn today and your tests are completely normal, you will receive your results only by: Marland Kitchen MyChart Message (if you have MyChart) OR . A paper copy in the mail If you have any lab test that is abnormal or we need to change your treatment, we will call you to review the results.  Testing/Procedures: You had an EKG today.   Follow-Up: At [facility], you and your health needs are our priority.  As part of our continuing mission to provide you with exceptional heart care, we have created designated Provider Care Teams.  These Care Teams include your primary Cardiologist (physician) and Advanced Practice Providers (APPs -  Physician Assistants and Nurse Practitioners) who all work together to provide you with the care you need, when you need it.  Your next appointment:   6 month(s)  The format for your next appointment:   In Person  Provider:   Shirlee More, MD  Other Instructions **Please check your blood pressure daily at the same time every day and keep a log of these readings. Call our office if your BP is consistently greater than 140/90.

## 2019-02-22 LAB — LIPID PANEL
Chol/HDL Ratio: 3.3 ratio (ref 0.0–5.0)
Cholesterol, Total: 146 mg/dL (ref 100–199)
HDL: 44 mg/dL (ref >39)
LDL Chol Calc (NIH): 61 mg/dL (ref 0–99)
Triglycerides: 254 mg/dL — ABNORMAL HIGH (ref 0–149)
VLDL Cholesterol Cal: 41 mg/dL — ABNORMAL HIGH (ref 5–40)

## 2019-02-22 LAB — LIPOPROTEIN A (LPA): Lipoprotein (a): 81.2 nmol/L — ABNORMAL HIGH (ref <75.0)

## 2019-02-23 ENCOUNTER — Other Ambulatory Visit: Payer: Self-pay | Admitting: *Deleted

## 2019-02-23 MED ORDER — METOPROLOL SUCCINATE ER 50 MG PO TB24: 50.0000 mg | ORAL_TABLET | Freq: Every day | ORAL | 1 refills | Status: DC

## 2019-05-07 ENCOUNTER — Other Ambulatory Visit: Payer: Self-pay | Admitting: *Deleted

## 2019-05-07 ENCOUNTER — Telehealth: Payer: Self-pay

## 2019-05-07 MED ORDER — ROSUVASTATIN CALCIUM 10 MG PO TABS: 10.0000 mg | ORAL_TABLET | Freq: Every day | ORAL | 1 refills | Status: DC

## 2019-05-07 NOTE — Telephone Encounter (Signed)
ROSUVASTATIN CALCIUM 10 MG TAB. #90.  LAST FILLED: 01/26/2019.  TAKE ONE TABLET BY MOUTH EVERY DAY.

## 2019-05-07 NOTE — Telephone Encounter (Signed)
Refill sent.

## 2019-07-24 ENCOUNTER — Other Ambulatory Visit: Payer: Self-pay | Admitting: *Deleted

## 2019-07-24 ENCOUNTER — Emergency Department (HOSPITAL_COMMUNITY): Payer: Commercial Managed Care - PPO

## 2019-07-24 ENCOUNTER — Other Ambulatory Visit: Payer: Self-pay

## 2019-07-24 ENCOUNTER — Encounter (HOSPITAL_COMMUNITY)
Admission: EM | Disposition: A | Discharge status: Home / Self Care | Payer: Self-pay | Source: Home / Self Care | Attending: Thoracic Surgery (Cardiothoracic Vascular Surgery)

## 2019-07-24 ENCOUNTER — Encounter (HOSPITAL_COMMUNITY): Payer: Self-pay | Admitting: *Deleted

## 2019-07-24 ENCOUNTER — Inpatient Hospital Stay (HOSPITAL_COMMUNITY)
Admission: EM | Admit: 2019-07-24 | Discharge: 2019-07-29 | DRG: 234 | Disposition: A | Discharge status: Home / Self Care | Payer: Commercial Managed Care - PPO | Attending: Interventional Cardiology | Admitting: Interventional Cardiology

## 2019-07-24 ENCOUNTER — Inpatient Hospital Stay (HOSPITAL_COMMUNITY): Payer: Commercial Managed Care - PPO

## 2019-07-24 DIAGNOSIS — Z888 Allergy status to other drugs, medicaments and biological substances status: Secondary | ICD-10-CM

## 2019-07-24 DIAGNOSIS — Z79899 Other long term (current) drug therapy: Secondary | ICD-10-CM | POA: Diagnosis not present

## 2019-07-24 DIAGNOSIS — Z7982 Long term (current) use of aspirin: Secondary | ICD-10-CM | POA: Diagnosis not present

## 2019-07-24 DIAGNOSIS — Z01810 Encounter for preprocedural cardiovascular examination: Secondary | ICD-10-CM

## 2019-07-24 DIAGNOSIS — I493 Ventricular premature depolarization: Secondary | ICD-10-CM | POA: Diagnosis not present

## 2019-07-24 DIAGNOSIS — I252 Old myocardial infarction: Secondary | ICD-10-CM | POA: Diagnosis not present

## 2019-07-24 DIAGNOSIS — J811 Chronic pulmonary edema: Secondary | ICD-10-CM | POA: Diagnosis present

## 2019-07-24 DIAGNOSIS — Z91013 Allergy to seafood: Secondary | ICD-10-CM | POA: Diagnosis not present

## 2019-07-24 DIAGNOSIS — Z09 Encounter for follow-up examination after completed treatment for conditions other than malignant neoplasm: Secondary | ICD-10-CM

## 2019-07-24 DIAGNOSIS — Z951 Presence of aortocoronary bypass graft: Secondary | ICD-10-CM

## 2019-07-24 DIAGNOSIS — E8770 Fluid overload, unspecified: Secondary | ICD-10-CM | POA: Diagnosis not present

## 2019-07-24 DIAGNOSIS — J9811 Atelectasis: Secondary | ICD-10-CM

## 2019-07-24 DIAGNOSIS — I129 Hypertensive chronic kidney disease with stage 1 through stage 4 chronic kidney disease, or unspecified chronic kidney disease: Secondary | ICD-10-CM | POA: Diagnosis present

## 2019-07-24 DIAGNOSIS — J90 Pleural effusion, not elsewhere classified: Secondary | ICD-10-CM | POA: Diagnosis present

## 2019-07-24 DIAGNOSIS — E7841 Elevated Lipoprotein(a): Secondary | ICD-10-CM | POA: Diagnosis present

## 2019-07-24 DIAGNOSIS — Q613 Polycystic kidney, unspecified: Secondary | ICD-10-CM | POA: Diagnosis not present

## 2019-07-24 DIAGNOSIS — N182 Chronic kidney disease, stage 2 (mild): Secondary | ICD-10-CM | POA: Diagnosis present

## 2019-07-24 DIAGNOSIS — N289 Disorder of kidney and ureter, unspecified: Secondary | ICD-10-CM | POA: Diagnosis present

## 2019-07-24 DIAGNOSIS — E785 Hyperlipidemia, unspecified: Secondary | ICD-10-CM | POA: Diagnosis not present

## 2019-07-24 DIAGNOSIS — I200 Unstable angina: Secondary | ICD-10-CM | POA: Diagnosis not present

## 2019-07-24 DIAGNOSIS — D62 Acute posthemorrhagic anemia: Secondary | ICD-10-CM | POA: Diagnosis not present

## 2019-07-24 DIAGNOSIS — Z8249 Family history of ischemic heart disease and other diseases of the circulatory system: Secondary | ICD-10-CM | POA: Diagnosis not present

## 2019-07-24 DIAGNOSIS — I2510 Atherosclerotic heart disease of native coronary artery without angina pectoris: Secondary | ICD-10-CM

## 2019-07-24 DIAGNOSIS — Z20822 Contact with and (suspected) exposure to covid-19: Secondary | ICD-10-CM | POA: Diagnosis present

## 2019-07-24 DIAGNOSIS — Z91011 Allergy to milk products: Secondary | ICD-10-CM | POA: Diagnosis not present

## 2019-07-24 DIAGNOSIS — I25110 Atherosclerotic heart disease of native coronary artery with unstable angina pectoris: Principal | ICD-10-CM | POA: Diagnosis present

## 2019-07-24 DIAGNOSIS — Z01818 Encounter for other preprocedural examination: Secondary | ICD-10-CM

## 2019-07-24 DIAGNOSIS — R0789 Other chest pain: Secondary | ICD-10-CM | POA: Diagnosis present

## 2019-07-24 HISTORY — PX: LEFT HEART CATH AND CORONARY ANGIOGRAPHY: CATH118249

## 2019-07-24 LAB — BASIC METABOLIC PANEL
Anion gap: 12 (ref 5–15)
BUN: 17 mg/dL (ref 6–20)
CO2: 21 mmol/L — ABNORMAL LOW (ref 22–32)
Calcium: 9.1 mg/dL (ref 8.9–10.3)
Chloride: 94 mmol/L — ABNORMAL LOW (ref 98–111)
Creatinine, Ser: 1.31 mg/dL — ABNORMAL HIGH (ref 0.61–1.24)
GFR calc Af Amer: >60 mL/min (ref >60)
GFR calc non Af Amer: 59 mL/min — ABNORMAL LOW (ref >60)
Glucose, Bld: 91 mg/dL (ref 70–99)
Potassium: 4.6 mmol/L (ref 3.5–5.1)
Sodium: 127 mmol/L — ABNORMAL LOW (ref 135–145)

## 2019-07-24 LAB — COMPREHENSIVE METABOLIC PANEL
ALT: 18 U/L (ref 0–44)
AST: 23 U/L (ref 15–41)
Albumin: 3.8 g/dL (ref 3.5–5.0)
Alkaline Phosphatase: 59 U/L (ref 38–126)
Anion gap: 9 (ref 5–15)
BUN: 14 mg/dL (ref 6–20)
CO2: 24 mmol/L (ref 22–32)
Calcium: 9 mg/dL (ref 8.9–10.3)
Chloride: 99 mmol/L (ref 98–111)
Creatinine, Ser: 1.43 mg/dL — ABNORMAL HIGH (ref 0.61–1.24)
GFR calc Af Amer: >60 mL/min (ref >60)
GFR calc non Af Amer: 53 mL/min — ABNORMAL LOW (ref >60)
Glucose, Bld: 110 mg/dL — ABNORMAL HIGH (ref 70–99)
Potassium: 4.1 mmol/L (ref 3.5–5.1)
Sodium: 132 mmol/L — ABNORMAL LOW (ref 135–145)
Total Bilirubin: 0.9 mg/dL (ref 0.3–1.2)
Total Protein: 6.3 g/dL — ABNORMAL LOW (ref 6.5–8.1)

## 2019-07-24 LAB — BILIRUBIN, DIRECT: Bilirubin, Direct: 0.2 mg/dL (ref 0.0–0.2)

## 2019-07-24 LAB — URINALYSIS, ROUTINE W REFLEX MICROSCOPIC
Bilirubin Urine: NEGATIVE
Glucose, UA: NEGATIVE mg/dL
Hgb urine dipstick: NEGATIVE
Ketones, ur: 5 mg/dL — AB
Leukocytes,Ua: NEGATIVE
Nitrite: NEGATIVE
Protein, ur: NEGATIVE mg/dL
Specific Gravity, Urine: 1.017 (ref 1.005–1.030)
pH: 8 (ref 5.0–8.0)

## 2019-07-24 LAB — ABO/RH: ABO/RH(D): B POS

## 2019-07-24 LAB — CBC
HCT: 41.3 % (ref 39.0–52.0)
Hemoglobin: 14.6 g/dL (ref 13.0–17.0)
MCH: 32.3 pg (ref 26.0–34.0)
MCHC: 35.4 g/dL (ref 30.0–36.0)
MCV: 91.4 fL (ref 80.0–100.0)
Platelets: 269 10*3/uL (ref 150–400)
RBC: 4.52 MIL/uL (ref 4.22–5.81)
RDW: 12.3 % (ref 11.5–15.5)
WBC: 5.3 10*3/uL (ref 4.0–10.5)
nRBC: 0 % (ref 0.0–0.2)

## 2019-07-24 LAB — POCT I-STAT 7, (LYTES, BLD GAS, ICA,H+H)
Acid-base deficit: 2 mmol/L (ref 0.0–2.0)
Bicarbonate: 21.3 mmol/L (ref 20.0–28.0)
Calcium, Ion: 1.26 mmol/L (ref 1.15–1.40)
HCT: 38 % — ABNORMAL LOW (ref 39.0–52.0)
Hemoglobin: 12.9 g/dL — ABNORMAL LOW (ref 13.0–17.0)
O2 Saturation: 96 %
Potassium: 3.9 mmol/L (ref 3.5–5.1)
Sodium: 131 mmol/L — ABNORMAL LOW (ref 135–145)
TCO2: 22 mmol/L (ref 22–32)
pCO2 arterial: 30.7 mmHg — ABNORMAL LOW (ref 32.0–48.0)
pH, Arterial: 7.449 (ref 7.350–7.450)
pO2, Arterial: 77 mmHg — ABNORMAL LOW (ref 83.0–108.0)

## 2019-07-24 LAB — ECHOCARDIOGRAM COMPLETE: Weight: 2927.71 oz

## 2019-07-24 LAB — SURGICAL PCR SCREEN
MRSA, PCR: NEGATIVE
Staphylococcus aureus: POSITIVE — AB

## 2019-07-24 LAB — LIPID PANEL
Cholesterol: 130 mg/dL (ref 0–200)
HDL: 56 mg/dL (ref >40)
LDL Cholesterol: 59 mg/dL (ref 0–99)
Total CHOL/HDL Ratio: 2.3 RATIO
Triglycerides: 73 mg/dL (ref <150)
VLDL: 15 mg/dL (ref 0–40)

## 2019-07-24 LAB — PROTIME-INR
INR: 1 (ref 0.8–1.2)
Prothrombin Time: 12.8 seconds (ref 11.4–15.2)

## 2019-07-24 LAB — TSH: TSH: 1.279 u[IU]/mL (ref 0.350–4.500)

## 2019-07-24 LAB — TYPE AND SCREEN
ABO/RH(D): B POS
Antibody Screen: NEGATIVE

## 2019-07-24 LAB — TROPONIN I (HIGH SENSITIVITY)
Troponin I (High Sensitivity): 9 ng/L (ref <18)
Troponin I (High Sensitivity): 7 ng/L (ref <18)
Troponin I (High Sensitivity): 30 ng/L — ABNORMAL HIGH (ref <18)

## 2019-07-24 LAB — MAGNESIUM: Magnesium: 1.6 mg/dL — ABNORMAL LOW (ref 1.7–2.4)

## 2019-07-24 LAB — SARS CORONAVIRUS 2 BY RT PCR (HOSPITAL ORDER, PERFORMED IN ~~LOC~~ HOSPITAL LAB): SARS Coronavirus 2: NEGATIVE

## 2019-07-24 LAB — HIV ANTIBODY (ROUTINE TESTING W REFLEX): HIV Screen 4th Generation wRfx: NONREACTIVE

## 2019-07-24 SURGERY — LEFT HEART CATH AND CORONARY ANGIOGRAPHY
Anesthesia: LOCAL

## 2019-07-24 MED ORDER — CHLORHEXIDINE GLUCONATE 0.12 % MT SOLN
15.0000 mL | Freq: Once | OROMUCOSAL | Status: AC
  Administered 2019-07-25: 15 mL via OROMUCOSAL
  Filled 2019-07-24: qty 15

## 2019-07-24 MED ORDER — SODIUM CHLORIDE 0.9 % IV SOLN: 250.0000 mL | INTRAVENOUS | Status: DC | PRN

## 2019-07-24 MED ORDER — PHENYLEPHRINE HCL-NACL 20-0.9 MG/250ML-% IV SOLN
30.0000 ug/min | INTRAVENOUS | Status: AC
  Administered 2019-07-25: 15 ug/min via INTRAVENOUS

## 2019-07-24 MED ORDER — MILRINONE LACTATE IN DEXTROSE 20-5 MG/100ML-% IV SOLN: 0.3000 ug/kg/min | INTRAVENOUS | Status: DC

## 2019-07-24 MED ORDER — SODIUM CHLORIDE 0.9 % IV SOLN
750.0000 mg | INTRAVENOUS | Status: DC
  Filled 2019-07-24: qty 750

## 2019-07-24 MED ORDER — PLASMA-LYTE 148 IV SOLN
INTRAVENOUS | Status: AC
  Administered 2019-07-25: 500 mL
  Filled 2019-07-24: qty 2.5

## 2019-07-24 MED ORDER — SODIUM CHLORIDE 0.9% FLUSH: 3.0000 mL | INTRAVENOUS | Status: DC | PRN

## 2019-07-24 MED ORDER — IOHEXOL 350 MG/ML SOLN
INTRAVENOUS | Status: DC | PRN
  Administered 2019-07-24: 90 mL

## 2019-07-24 MED ORDER — LIDOCAINE HCL (PF) 1 % IJ SOLN
INTRAMUSCULAR | Status: DC | PRN
  Administered 2019-07-24: 2 mL

## 2019-07-24 MED ORDER — METOPROLOL TARTRATE 12.5 MG HALF TABLET
12.5000 mg | ORAL_TABLET | Freq: Once | ORAL | Status: AC
  Administered 2019-07-25: 12.5 mg via ORAL
  Filled 2019-07-24: qty 1

## 2019-07-24 MED ORDER — HEPARIN SODIUM (PORCINE) 1000 UNIT/ML IJ SOLN
INTRAMUSCULAR | Status: DC | PRN
  Administered 2019-07-24: 5000 [IU] via INTRAVENOUS

## 2019-07-24 MED ORDER — HEPARIN (PORCINE) IN NACL 1000-0.9 UT/500ML-% IV SOLN
INTRAVENOUS | Status: DC | PRN
  Administered 2019-07-24 (×2): 500 mL

## 2019-07-24 MED ORDER — VANCOMYCIN HCL 1250 MG/250ML IV SOLN
1250.0000 mg | INTRAVENOUS | Status: DC
  Filled 2019-07-24 (×2): qty 250

## 2019-07-24 MED ORDER — ZOLPIDEM TARTRATE 5 MG PO TABS: 5.0000 mg | ORAL_TABLET | Freq: Every evening | ORAL | Status: DC | PRN

## 2019-07-24 MED ORDER — NITROGLYCERIN IN D5W 200-5 MCG/ML-% IV SOLN: 0.0000 ug/min | INTRAVENOUS | Status: DC

## 2019-07-24 MED ORDER — TRANEXAMIC ACID (OHS) BOLUS VIA INFUSION
15.0000 mg/kg | INTRAVENOUS | Status: DC
  Filled 2019-07-24: qty 1245

## 2019-07-24 MED ORDER — NITROGLYCERIN 0.4 MG SL SUBL: 0.4000 mg | SUBLINGUAL_TABLET | SUBLINGUAL | Status: DC | PRN

## 2019-07-24 MED ORDER — INSULIN REGULAR(HUMAN) IN NACL 100-0.9 UT/100ML-% IV SOLN
INTRAVENOUS | Status: DC
  Filled 2019-07-24 (×2): qty 100

## 2019-07-24 MED ORDER — SODIUM CHLORIDE 0.9 % IV SOLN
INTRAVENOUS | Status: DC
  Filled 2019-07-24: qty 30

## 2019-07-24 MED ORDER — FENTANYL CITRATE (PF) 100 MCG/2ML IJ SOLN
INTRAMUSCULAR | Status: DC | PRN
  Administered 2019-07-24: 50 ug via INTRAVENOUS

## 2019-07-24 MED ORDER — ACETAMINOPHEN 325 MG PO TABS
650.0000 mg | ORAL_TABLET | ORAL | Status: DC | PRN
  Administered 2019-07-24: 650 mg via ORAL
  Filled 2019-07-24: qty 2

## 2019-07-24 MED ORDER — SODIUM CHLORIDE 0.9 % IV SOLN: INTRAVENOUS | Status: DC

## 2019-07-24 MED ORDER — ONDANSETRON HCL 4 MG/2ML IJ SOLN: 4.0000 mg | Freq: Four times a day (QID) | INTRAMUSCULAR | Status: DC | PRN

## 2019-07-24 MED ORDER — HEPARIN BOLUS VIA INFUSION
4000.0000 [IU] | Freq: Once | INTRAVENOUS | Status: AC
  Administered 2019-07-24: 4000 [IU] via INTRAVENOUS
  Filled 2019-07-24: qty 4000

## 2019-07-24 MED ORDER — ALPRAZOLAM 0.25 MG PO TABS: 0.2500 mg | ORAL_TABLET | ORAL | Status: DC | PRN

## 2019-07-24 MED ORDER — METOPROLOL SUCCINATE ER 25 MG PO TB24
50.0000 mg | ORAL_TABLET | Freq: Every day | ORAL | Status: DC
  Administered 2019-07-24: 50 mg via ORAL
  Filled 2019-07-24: qty 2

## 2019-07-24 MED ORDER — SODIUM CHLORIDE 0.9 % IV SOLN: INTRAVENOUS | Status: AC

## 2019-07-24 MED ORDER — SODIUM CHLORIDE 0.9% FLUSH: 3.0000 mL | Freq: Two times a day (BID) | INTRAVENOUS | Status: DC

## 2019-07-24 MED ORDER — MAGNESIUM SULFATE 50 % IJ SOLN
40.0000 meq | INTRAMUSCULAR | Status: DC
  Filled 2019-07-24: qty 9.85

## 2019-07-24 MED ORDER — TRANEXAMIC ACID 1000 MG/10ML IV SOLN
1.5000 mg/kg/h | INTRAVENOUS | Status: AC
  Administered 2019-07-25: 15 mg/kg/h via INTRAVENOUS
  Filled 2019-07-24: qty 25

## 2019-07-24 MED ORDER — VERAPAMIL HCL 2.5 MG/ML IV SOLN
INTRAVENOUS | Status: DC | PRN
  Administered 2019-07-24: 10 mL via INTRA_ARTERIAL

## 2019-07-24 MED ORDER — CHLORHEXIDINE GLUCONATE CLOTH 2 % EX PADS: 6.0000 | MEDICATED_PAD | Freq: Every day | CUTANEOUS | Status: DC

## 2019-07-24 MED ORDER — ASPIRIN 81 MG PO CHEW: 81.0000 mg | CHEWABLE_TABLET | Freq: Every day | ORAL | Status: DC

## 2019-07-24 MED ORDER — DEXMEDETOMIDINE HCL IN NACL 400 MCG/100ML IV SOLN
0.1000 ug/kg/h | INTRAVENOUS | Status: DC
  Filled 2019-07-24 (×2): qty 100

## 2019-07-24 MED ORDER — ROSUVASTATIN CALCIUM 5 MG PO TABS
10.0000 mg | ORAL_TABLET | Freq: Every day | ORAL | Status: DC
  Administered 2019-07-26 – 2019-07-29 (×4): 10 mg via ORAL
  Filled 2019-07-24 (×3): qty 2
  Filled 2019-07-24: qty 1
  Filled 2019-07-24 (×2): qty 2

## 2019-07-24 MED ORDER — BISACODYL 5 MG PO TBEC: 5.0000 mg | DELAYED_RELEASE_TABLET | Freq: Once | ORAL | Status: DC

## 2019-07-24 MED ORDER — EPINEPHRINE HCL 5 MG/250ML IV SOLN IN NS: 0.0000 ug/min | INTRAVENOUS | Status: DC

## 2019-07-24 MED ORDER — TRANEXAMIC ACID (OHS) PUMP PRIME SOLUTION
2.0000 mg/kg | INTRAVENOUS | Status: DC
  Filled 2019-07-24: qty 1.66

## 2019-07-24 MED ORDER — MILRINONE LACTATE IN DEXTROSE 20-5 MG/100ML-% IV SOLN
0.3000 ug/kg/min | INTRAVENOUS | Status: DC
  Filled 2019-07-24 (×2): qty 100

## 2019-07-24 MED ORDER — LABETALOL HCL 5 MG/ML IV SOLN
10.0000 mg | INTRAVENOUS | Status: AC | PRN
  Administered 2019-07-24: 10 mg via INTRAVENOUS
  Filled 2019-07-24: qty 4

## 2019-07-24 MED ORDER — MIDAZOLAM HCL 2 MG/2ML IJ SOLN
INTRAMUSCULAR | Status: DC | PRN
  Administered 2019-07-24: 2 mg via INTRAVENOUS

## 2019-07-24 MED ORDER — HEPARIN (PORCINE) 25000 UT/250ML-% IV SOLN
950.0000 [IU]/h | INTRAVENOUS | Status: DC
  Administered 2019-07-24: 950 [IU]/h via INTRAVENOUS

## 2019-07-24 MED ORDER — NITROGLYCERIN IN D5W 200-5 MCG/ML-% IV SOLN
2.0000 ug/min | INTRAVENOUS | Status: DC
  Filled 2019-07-24: qty 250

## 2019-07-24 MED ORDER — CHLORHEXIDINE GLUCONATE CLOTH 2 % EX PADS
6.0000 | MEDICATED_PAD | Freq: Once | CUTANEOUS | Status: AC
  Administered 2019-07-24: 6 via TOPICAL

## 2019-07-24 MED ORDER — VANCOMYCIN HCL 1250 MG/250ML IV SOLN
1250.0000 mg | INTRAVENOUS | Status: AC
  Administered 2019-07-25: 1250 mg via INTRAVENOUS
  Filled 2019-07-24: qty 250

## 2019-07-24 MED ORDER — NOREPINEPHRINE 4 MG/250ML-% IV SOLN: 0.0000 ug/min | INTRAVENOUS | Status: DC

## 2019-07-24 MED ORDER — DEXMEDETOMIDINE HCL IN NACL 400 MCG/100ML IV SOLN
0.1000 ug/kg/h | INTRAVENOUS | Status: AC
  Administered 2019-07-25: .2 ug/kg/h via INTRAVENOUS

## 2019-07-24 MED ORDER — HYDRALAZINE HCL 20 MG/ML IJ SOLN
INTRAMUSCULAR | Status: AC
  Filled 2019-07-24: qty 1

## 2019-07-24 MED ORDER — VERAPAMIL HCL 2.5 MG/ML IV SOLN
INTRAVENOUS | Status: AC
  Filled 2019-07-24: qty 2

## 2019-07-24 MED ORDER — DIAZEPAM 2 MG PO TABS
2.0000 mg | ORAL_TABLET | Freq: Once | ORAL | Status: AC
  Administered 2019-07-25: 2 mg via ORAL
  Filled 2019-07-24: qty 1

## 2019-07-24 MED ORDER — NITROGLYCERIN 0.4 MG SL SUBL
0.4000 mg | SUBLINGUAL_TABLET | SUBLINGUAL | Status: AC | PRN
  Administered 2019-07-24 (×2): 0.4 mg via SUBLINGUAL
  Filled 2019-07-24: qty 1

## 2019-07-24 MED ORDER — POTASSIUM CHLORIDE 2 MEQ/ML IV SOLN
80.0000 meq | INTRAVENOUS | Status: DC
  Filled 2019-07-24: qty 40

## 2019-07-24 MED ORDER — HYDRALAZINE HCL 20 MG/ML IJ SOLN
10.0000 mg | INTRAMUSCULAR | Status: AC | PRN
  Administered 2019-07-24: 10 mg via INTRAVENOUS

## 2019-07-24 MED ORDER — INSULIN REGULAR(HUMAN) IN NACL 100-0.9 UT/100ML-% IV SOLN
INTRAVENOUS | Status: AC
  Administered 2019-07-25: .6 [IU]/h via INTRAVENOUS

## 2019-07-24 MED ORDER — SODIUM CHLORIDE 0.9% FLUSH: 3.0000 mL | Freq: Once | INTRAVENOUS | Status: DC

## 2019-07-24 MED ORDER — NITROGLYCERIN IN D5W 200-5 MCG/ML-% IV SOLN
2.0000 ug/min | INTRAVENOUS | Status: AC
  Administered 2019-07-25: 5 ug/min via INTRAVENOUS

## 2019-07-24 MED ORDER — FENTANYL CITRATE (PF) 100 MCG/2ML IJ SOLN
INTRAMUSCULAR | Status: AC
  Filled 2019-07-24: qty 2

## 2019-07-24 MED ORDER — CHLORHEXIDINE GLUCONATE CLOTH 2 % EX PADS: 6.0000 | MEDICATED_PAD | Freq: Once | CUTANEOUS | Status: DC

## 2019-07-24 MED ORDER — NOREPINEPHRINE 4 MG/250ML-% IV SOLN
0.0000 ug/min | INTRAVENOUS | Status: DC
  Filled 2019-07-24: qty 250

## 2019-07-24 MED ORDER — LIDOCAINE HCL (PF) 1 % IJ SOLN
INTRAMUSCULAR | Status: AC
  Filled 2019-07-24: qty 30

## 2019-07-24 MED ORDER — EPINEPHRINE HCL 5 MG/250ML IV SOLN IN NS
0.0000 ug/min | INTRAVENOUS | Status: DC
  Filled 2019-07-24 (×2): qty 250

## 2019-07-24 MED ORDER — TRANEXAMIC ACID 1000 MG/10ML IV SOLN
1.5000 mg/kg/h | INTRAVENOUS | Status: DC
  Filled 2019-07-24: qty 25

## 2019-07-24 MED ORDER — PHENYLEPHRINE HCL-NACL 20-0.9 MG/250ML-% IV SOLN
30.0000 ug/min | INTRAVENOUS | Status: DC
  Filled 2019-07-24 (×2): qty 250

## 2019-07-24 MED ORDER — PLASMA-LYTE 148 IV SOLN
INTRAVENOUS | Status: DC
  Filled 2019-07-24: qty 2.5

## 2019-07-24 MED ORDER — NITROGLYCERIN IN D5W 200-5 MCG/ML-% IV SOLN
0.0000 ug/min | INTRAVENOUS | Status: DC
  Administered 2019-07-24: 5 ug/min via INTRAVENOUS
  Filled 2019-07-24: qty 250

## 2019-07-24 MED ORDER — HEPARIN (PORCINE) 25000 UT/250ML-% IV SOLN: 950.0000 [IU]/h | INTRAVENOUS | Status: DC

## 2019-07-24 MED ORDER — ASPIRIN 81 MG PO CHEW
324.0000 mg | CHEWABLE_TABLET | Freq: Once | ORAL | Status: AC
  Administered 2019-07-24: 324 mg via ORAL
  Filled 2019-07-24: qty 4

## 2019-07-24 MED ORDER — HEPARIN (PORCINE) IN NACL 1000-0.9 UT/500ML-% IV SOLN
INTRAVENOUS | Status: AC
  Filled 2019-07-24: qty 500

## 2019-07-24 MED ORDER — SODIUM CHLORIDE 0.9 % IV SOLN
1.5000 g | INTRAVENOUS | Status: AC
  Administered 2019-07-25: 1.5 g via INTRAVENOUS
  Administered 2019-07-25: .75 g via INTRAVENOUS
  Filled 2019-07-24: qty 1.5

## 2019-07-24 MED ORDER — HEPARIN (PORCINE) 25000 UT/250ML-% IV SOLN
950.0000 [IU]/h | INTRAVENOUS | Status: DC
  Administered 2019-07-24: 950 [IU]/h via INTRAVENOUS
  Filled 2019-07-24: qty 250

## 2019-07-24 MED ORDER — ALPRAZOLAM 0.25 MG PO TABS: 0.2500 mg | ORAL_TABLET | Freq: Two times a day (BID) | ORAL | Status: DC | PRN

## 2019-07-24 MED ORDER — MIDAZOLAM HCL 2 MG/2ML IJ SOLN
INTRAMUSCULAR | Status: AC
  Filled 2019-07-24: qty 2

## 2019-07-24 MED ORDER — SODIUM CHLORIDE 0.9 % IV SOLN
1.5000 g | INTRAVENOUS | Status: DC
  Filled 2019-07-24: qty 1.5

## 2019-07-24 MED ORDER — TEMAZEPAM 15 MG PO CAPS: 15.0000 mg | ORAL_CAPSULE | Freq: Once | ORAL | Status: DC | PRN

## 2019-07-24 MED ORDER — ACETAMINOPHEN 325 MG PO TABS
650.0000 mg | ORAL_TABLET | ORAL | Status: DC | PRN
Start: 2019-07-24 — End: 2019-07-24

## 2019-07-24 SURGICAL SUPPLY — 10 items
CATH 5FR JL3.5 JR4 ANG PIG MP (CATHETERS) ×2 IMPLANT
DEVICE RAD COMP TR BAND LRG (VASCULAR PRODUCTS) ×2 IMPLANT
GLIDESHEATH SLEND SS 6F .021 (SHEATH) ×2 IMPLANT
GUIDEWIRE INQWIRE 1.5J.035X260 (WIRE) ×1 IMPLANT
INQWIRE 1.5J .035X260CM (WIRE) ×2
KIT HEART LEFT (KITS) ×2 IMPLANT
PACK CARDIAC CATHETERIZATION (CUSTOM PROCEDURE TRAY) ×2 IMPLANT
SYR MEDRAD MARK 7 150ML (SYRINGE) ×2 IMPLANT
TRANSDUCER W/STOPCOCK (MISCELLANEOUS) ×2 IMPLANT
TUBING CIL FLEX 10 FLL-RA (TUBING) ×2 IMPLANT

## 2019-07-24 NOTE — Progress Notes (Addendum)
[place] for heparin Indication: chest pain/ACS  Allergies  Allergen Reactions  . Shellfish Allergy Anaphylaxis  . Gluten Meal Other (See Comments)    "can't have", "get inside vibration and get weaker"  . Milk-Related Compounds Other (See Comments)    Stomach issues, "get weaker"  . Ramipril Other (See Comments)    Head spinning and heart racing    Patient Measurements: Weight: 83 kg (182 lb 15.7 oz) Heparin Dosing Weight: TBW  Vital Signs: Temp: 97.9 F (36.6 C) (05/18 0617) Temp Source: Oral (05/18 0617) BP: 140/98 (05/18 1545) Pulse Rate: 62 (05/18 1545)  Labs: Recent Labs    07/24/19 0625 07/24/19 0821  HGB 14.6  --   HCT 41.3  --   PLT 269  --   CREATININE 1.31*  --   TROPONINIHS 9 7    Estimated Creatinine Clearance: 64.7 mL/min (A) (by C-G formula based on SCr of 1.31 mg/dL (H)).   Medical History: Past Medical History:  Diagnosis Date  . Anginal pain ([place])   . Asthma    as a child  . CAD (coronary artery disease) 11/24/2012   11/24/12 Cath nl LM, 99%LAD post D1, 100% 1rst OM with collaterals, 40% circumflex, no RCA dz, s/p DES LAD & CFX/OM1 Dr. Tamala Julian    . Hyperlipidemia 11/24/2012  . MI, old 2014   Diagnosis based on left ventricular dysfunction seen on stress test  . Personal history of other diseases of digestive system   . Polycystic kidney disease    "dx'd but have never had any symptoms" (07/05/2013)   Assessment: 44 YOM presenting with CP, hx of CAD (s/p PCI 2014).  No anticoagulation PTA. He is now s/p cath for CABG consult. Pharmacy to restart heparin 6 hours post sheath removal (removed ~ 3pm)  Goal of Therapy:  Heparin level 0.3-0.7 units/ml Monitor platelets by anticoagulation protocol: Yes   Plan:  -Restart heparin at 950 units/hr at 9pm -Heparin level in 6 hours and daily wth CBC daily  Hildred Laser, PharmD Clinical Pharmacist **Pharmacist phone directory can now be found on [email or web address] (PW  [email or web address]).  Listed under [place].

## 2019-07-24 NOTE — ED Provider Notes (Addendum)
[facility] EMERGENCY DEPARTMENT Provider Note   CSN: [id number] Arrival date & time: 07/24/19  [id number]     History Chief Complaint  Patient presents with  . Chest Pain    Tyler Sweeney is a 59 y.o. male with a history of CAD s/p PCI and stent to the LAD and M1 in 2014 & PCI with cutting balloon to [facility] in 2015 for focal stenosis just distal to the M stent, hyperlipidemia, and CKD who presents to the ED with complaints of chest discomfort that woke him from sleep at 1AM this morning. Patient states pain is a burning/aching to the non-radiating left chest with associated mild diaphoresis. He tried anti-acid medicine without relief, tried nitroglycerin with mild relief, otherwise non alleviating/aggravating factors. Pain seems to be waxing/waning in severity between a 2-5. He states that he did have some discomfort yesterday- was riding an exercise bike and about 30 minutes in had a discomfort to his substernal region that felt similar to prior angina, tonight's pain felt different. He has had a few rare episodes of angina s/p PCI in 2015. He states pain yesterday resolved with rest. He denies N/V, lightheadedness, dizziness, or syncope.  Denies leg pain/swelling, hemoptysis, recent surgery/trauma, recent long travel, hormone use, personal hx of cancer, or hx of DVT/PE.   Per chart review:  His primary cardiologist is Dr. Bettina Gavia.  Most recent left hearth cath 07/06/19:  IMPRESSIONS: 1. Unstable angina with new stenosis distal to the previously placed stents in the circumflex continuation branch 2. Widely patent LAD stent and widely patent stent of the circumflex first marginal and continuation branch 3. Normal LVEDP RECOMMENDATION:  Discussed with Dr. Tamala Julian while patient on the table. The patient has had new onset of unstable angina symptoms in addition had significant sweating last night although symptoms had some atypical features do to vertigo and some vomiting. Plan to proceed  with percutaneous intervention of the stenosis in the circumflex. Discussed possibility of not getting stent down the artery and just being able to do percutaneous intervention with balloon  HPI     Past Medical History:  Diagnosis Date  . Anginal pain ([place])   . Asthma    "stopped at age 17"  . CAD (coronary artery disease) 11/24/2012   11/24/12 Cath normal LM, 99%LAD post diagonal, 100% 1rst OM with collaterals, 40% circumflex, no RCA Stent LAD Dr. Tamala Julian    . Hyperlipidemia 11/24/2012  . MI, old    "don't know when"  . Personal history of other diseases of digestive system   . Polycystic kidney disease    "dx'd but have never had any symptoms" (07/05/2013)    Patient Active Problem List   Diagnosis Date Noted  . CKD (chronic kidney disease) stage 2, GFR 60-89 ml/min 02/13/2018  . S/P appendectomy 09/19/2016  . Chest pain 07/05/2013  . Unstable angina ([place]) 07/05/2013  . Vertigo   . CAD (coronary artery disease) 11/24/2012  . Hyperlipidemia 11/24/2012  . GERD (gastroesophageal reflux disease) 05/08/2010    Past Surgical History:  Procedure Laterality Date  . COLONOSCOPY  11/02/2011   Procedure: COLONOSCOPY;  Surgeon: Winfield Cunas., MD;  Location: [facility];  Service: Endoscopy;  Laterality: N/A;  . CORONARY ANGIOPLASTY  07/05/2013  . CORONARY ANGIOPLASTY WITH STENT PLACEMENT  11/24/2012   "3 stents"  . FOOT SURGERY Left 1984   "cyst"  . INGUINAL HERNIA REPAIR Right 1962  . LAPAROSCOPIC APPENDECTOMY N/A 09/19/2016   Procedure: APPENDECTOMY LAPAROSCOPIC;  Surgeon: Rosendo Gros,  Anne Hahn, MD;  Location: [place];  Service: General;  Laterality: N/A;  . LEFT HEART CATHETERIZATION WITH CORONARY ANGIOGRAM N/A 11/24/2012   Procedure: LEFT HEART CATHETERIZATION WITH CORONARY ANGIOGRAM;  Surgeon: Jacolyn Reedy, MD;  Location[facility] CATH LAB;  Service: Cardiovascular;  Laterality: N/A;  . LEFT HEART CATHETERIZATION WITH CORONARY ANGIOGRAM N/A 07/05/2013   Procedure: LEFT HEART  CATHETERIZATION WITH CORONARY ANGIOGRAM;  Surgeon: Jacolyn Reedy, MD;  Location: [facility] CATH LAB;  Service: Cardiovascular;  Laterality: N/A;  . PERCUTANEOUS CORONARY STENT INTERVENTION (PCI-S)  11/24/2012   Procedure: PERCUTANEOUS CORONARY STENT INTERVENTION (PCI-S);  Surgeon: Jacolyn Reedy, MD;  Location: [facility] CATH LAB;  Service: Cardiovascular;;  . PERCUTANEOUS CORONARY STENT INTERVENTION (PCI-S) Right 07/05/2013   Procedure: PERCUTANEOUS CORONARY STENT INTERVENTION (PCI-S);  Surgeon: Jacolyn Reedy, MD;  Location: [facility] CATH LAB;  Service: Cardiovascular;  Laterality: Right;       Family History  Problem Relation Age of Onset  . Coronary artery disease Other   . Heart attack Father   . Heart attack Mother     Social History   Tobacco Use  . Smoking status: Never Smoker  . Smokeless tobacco: Never Used  Substance Use Topics  . Alcohol use: No  . Drug use: No    Home Medications Prior to Admission medications   Medication Sig Start Date End Date Taking? Authorizing Provider  aspirin 81 MG chewable tablet Chew 1 tablet (81 mg total) by mouth daily. 11/25/12   Jerline Pain, MD  metoprolol succinate (TOPROL-XL) 50 MG 24 hr tablet Take 1 tablet (50 mg total) by mouth daily. Take with or immediately following a meal. 02/23/19   Munley, Hilton Cork, MD  nitroGLYCERIN (NITROSTAT) 0.4 MG SL tablet Place 1 tablet (0.4 mg total) under the tongue every 5 (five) minutes as needed for chest pain. 02/13/18   Richardo Priest, MD  rosuvastatin (CRESTOR) 10 MG tablet Take 1 tablet (10 mg total) by mouth daily. 05/07/19   Richardo Priest, MD    Allergies    Shellfish allergy, Gluten meal, Milk-related compounds, and Ramipril  Review of Systems   Review of Systems  Constitutional: Negative for chills and fever.  Respiratory: Negative for shortness of breath.   Cardiovascular: Positive for chest pain. Negative for leg swelling.  Gastrointestinal: Negative for abdominal pain, nausea and vomiting.    Neurological: Negative for dizziness, syncope and light-headedness.  All other systems reviewed and are negative.   Physical Exam Updated Vital Signs BP (!) 178/114 (BP Location: Right Arm)   Pulse 67   Temp 97.9 F (36.6 C) (Oral)   Resp 16   SpO2 100%   Physical Exam Vitals and nursing note reviewed.  Constitutional:      General: He is not in acute distress.    Appearance: He is well-developed. He is not toxic-appearing.  HENT:     Head: Normocephalic and atraumatic.  Eyes:     General:        Right eye: No discharge.        Left eye: No discharge.     Conjunctiva/sclera: Conjunctivae normal.  Cardiovascular:     Rate and Rhythm: Normal rate and regular rhythm.     Pulses:          Radial pulses are 2+ on the right side and 2+ on the left side.       Posterior tibial pulses are 2+ on the right side and 2+ on the left side.  Pulmonary:     Effort: Pulmonary effort is normal. No respiratory distress.     Breath sounds: Normal breath sounds. No wheezing, rhonchi or rales.  Abdominal:     General: There is no distension.     Palpations: Abdomen is soft.     Tenderness: There is no abdominal tenderness.  Musculoskeletal:     Cervical back: Neck supple.     Right lower leg: No tenderness. No edema.     Left lower leg: No tenderness. No edema.  Skin:    General: Skin is warm and dry.     Findings: No rash.  Neurological:     Mental Status: He is alert.     Comments: Clear speech.   Psychiatric:        Behavior: Behavior normal.     ED Results / Procedures / Treatments   Labs (all labs ordered are listed, but only abnormal results are displayed) Labs Reviewed  BASIC METABOLIC PANEL  CBC  TROPONIN I (HIGH SENSITIVITY)    EKG EKG Interpretation  Date/Time:  Tuesday Jul 24 2019 07:08:00 EDT Ventricular Rate:  61 PR Interval:  200 QRS Duration: 103 QT Interval:  429 QTC Calculation: 433 R Axis:   42 Text Interpretation: Sinus rhythm Borderline prolonged  PR interval Low voltage, precordial leads RSR' in V1 or V2, right VCD or RVH Consider anterior infarct subtle limb lead ST abnormalities similar to 2015 Confirmed by Sherwood Gambler [phone]) on 07/24/2019 7:13:27 AM   Radiology DG Chest 2 View  Result Date: 07/24/2019 CLINICAL DATA:  Chest pain since 1 a.m. this morning. EXAM: CHEST - 2 VIEW COMPARISON:  11/23/2012 FINDINGS: The cardiac silhouette, mediastinal and hilar contours are within normal limits. The lungs are clear. No infiltrates, edema or effusions. No pulmonary lesions. The bony thorax is intact. IMPRESSION: No acute cardiopulmonary findings. Electronically Signed   By: Marijo Sanes M.D.   On: 07/24/2019 06:36    Procedures .Critical Care Performed by: Amaryllis Dyke, PA-C Authorized by: Amaryllis Dyke, PA-C    CRITICAL CARE Performed by: Kennith Maes   Total critical care time: 30 minutes  Critical care time was exclusive of separately billable procedures and treating other patients.  Critical care was necessary to treat or prevent imminent or life-threatening deterioration.  Critical care was time spent personally by me on the following activities: development of treatment plan with patient and/or surrogate as well as nursing, discussions with consultants, evaluation of patient's response to treatment, examination of patient, obtaining history from patient or surrogate, ordering and performing treatments and interventions, ordering and review of laboratory studies, ordering and review of radiographic studies, pulse oximetry and re-evaluation of patient's condition.    (including critical care time)  Medications Ordered in ED Medications  sodium chloride flush (NS) 0.9 % injection 3 mL (3 mLs Intravenous Not Given 07/24/19 0829)  nitroGLYCERIN 50 mg in dextrose 5 % 250 mL (0.2 mg/mL) infusion (5 mcg/min Intravenous New Bag/Given 07/24/19 0819)  heparin ADULT infusion 100 units/mL (25000 units/281mL  sodium chloride 0.45%) (950 Units/hr Intravenous New Bag/Given 07/24/19 0817)  0.9 %  sodium chloride infusion (has no administration in time range)  0.9 %  sodium chloride infusion (has no administration in time range)  nitroGLYCERIN (NITROSTAT) SL tablet 0.4 mg (0.4 mg Sublingual Given 07/24/19 0738)  aspirin chewable tablet 324 mg (324 mg Oral Given 07/24/19 0731)  heparin bolus via infusion 4,000 Units (4,000 Units Intravenous Bolus from Bag 07/24/19 0817)    ED  Course  I have reviewed the triage vital signs and the nursing notes.  Pertinent labs & imaging results that were available during my care of the patient were reviewed by me and considered in my medical decision making (see chart for details).    MDM Rules/Calculators/A&P                     Patient presents to the ED with complaints of chest discomfort. Nontoxic, vitals with elevated BP. Benign physical exam.  Ddx: ACS, PE, dissection, GERD, pneumonia, critical anemia, anxiety.   Additional history obtained:  Additional history obtained from chart review & nursing note review.  EKG: Sinus rhythm Borderline prolonged PR interval Low voltage, precordial leads RSR' in V1 or V2, right VCD or RVH Consider anterior infarct subtle limb lead ST abnormalities similar to 2015 Lab Tests:  I Ordered, reviewed, and interpreted labs, which included:  CBC: No anemia or leukocytosis. BMP: Renal function similar to prior, mild electrolyte abnormalities as above. Troponin: 9 Imaging Studies ordered:  I ordered imaging studies which included chest x-ray, I independently visualized and interpreted imaging which showed  No acute cardiopulmonary findings.  ED Course:  Dr. Regenia Skeeter spoke with cardiologist Dr. Irish Lack, no indication for acute Cath Lab activation for STEMI, but if his pain cannot be controlled he may need Cath Lab anyway.  Was given nitroglycerin and will be placed on IV heparin and we will consult cardiology.   07:48: Patient has  had mild improvement s/p nitroglycerin but remains with discomfort. Will start nitroglycerin drip.   Dr. Regenia Skeeter discussed with cardiology who will come to the ED to evaluate the patient.   09:00: Re-discussed with Barrett PA-C with cardiology- plan for cath lab.   Portions of this note were generated with [employer]. Dictation errors may occur despite best attempts at proofreading.  Final Clinical Impression(s) / ED Diagnoses Final diagnoses:  Unstable angina [facility])    Rx / DC Orders ED Discharge Orders    None       Amaryllis Dyke, PA-C 07/24/19 1054 Tyler Sweeney was evaluated in Emergency Department on 07/24/2019 for the symptoms described in the history of present illness. He/she was evaluated in the context of the global COVID-19 pandemic, which necessitated consideration that the patient might be at risk for infection with the SARS-CoV-2 virus that causes COVID-19. Institutional protocols and algorithms that pertain to the evaluation of patients at risk for COVID-19 are in a state of rapid change based on information released by regulatory bodies including the CDC and federal and state organizations. These policies and algorithms were followed during the patient's care in the ED.     Amaryllis Dyke, PA-C 07/24/19 1054    Sherwood Gambler, MD 07/24/19 1525

## 2019-07-24 NOTE — Consult Note (Addendum)
[address]       [place] [address]             [phone]        [facility] Medical Record [id number] Date of Birth: Jul 27, 1960  Referring: No ref. provider found Primary Care: Orpah Melter, MD Primary Cardiologist:Brian Bettina Gavia, MD  Chief Complaint:    Chief Complaint  Patient presents with  . Chest Pain    History of Present Illness:    Patient is a 59 year old male we are asked to see in cardiothoracic surgical consultation for consideration of CABG.  Patient presented to the emergency room on 07/24/2019 with complaints of chest pain.  The patient has a history of previous LAD stent in 2014 in the setting of a complex culotte circumflex OM1 procedure.  In 2015 he had angioplasty of restenosis of the circumflex beyond the stent site.  More recently the patient has begun to notice angina with exertion.  On today's date it awoke him from sleep in a presented to the emergency department.  He did have subtle ST abnormalities on EKG.  Due to his previous history was felt by cardiology that he should undergo repeat cardiac catheterization which was done this afternoon.  Findings are as described below.  Findings included severe ostial LAD disease stenosis.  This arises just beyond the ostial circumflex stent and appears to extend into the left main.  He has a patent mid LAD stent with minimal stenosis.  The moderate caliber diagonal branch that arises from the mid LAD stented segment has moderately severe ostial stenosis.  The ostial/proximal circumflex stented segment is patent with moderate stent restenosis.  The stented segment of the ostial OM1 branch is patent with moderately severe stenosis.  The mid circumflex has a severe stenosis just beyond the takeoff of OM two.  He has mild segmental LV systolic dysfunction and no significant RCA disease.  Due to the progression and severity of the findings we are asked to see in consideration for CABG as his  best revascularization option at this point.   Current Activity/ Functional Status: Patient is independent with mobility/ambulation, transfers, ADL's, IADL's.   Zubrod Score: At the time of surgery this patient's most appropriate activity status/level should be described as: []     0    Normal activity, no symptoms [x]     1    Restricted in physical strenuous activity but ambulatory, able to do out light work []     2    Ambulatory and capable of self care, unable to do work activities, up and about                 more than 50%  Of the time                            []     3    Only limited self care, in bed greater than 50% of waking hours []     4    Completely disabled, no self care, confined to bed or chair []     5    Moribund  Past Medical History:  Diagnosis Date  . Anginal pain ([place])   . Asthma    as a child  . CAD (coronary artery disease) 11/24/2012   11/24/12 Cath nl LM, 99%LAD post D1, 100% 1rst OM with collaterals, 40% circumflex, no RCA dz, s/p DES LAD & CFX/OM1 Dr.  Smith    . Hyperlipidemia 11/24/2012  . MI, old 2014   Diagnosis based on left ventricular dysfunction seen on stress test  . Personal history of other diseases of digestive system   . Polycystic kidney disease    "dx'd but have never had any symptoms" (07/05/2013)    Past Surgical History:  Procedure Laterality Date  . COLONOSCOPY  11/02/2011   Procedure: COLONOSCOPY;  Surgeon: Winfield Cunas., MD;  Location: [facility];  Service: Endoscopy;  Laterality: N/A;  . CORONARY ANGIOPLASTY  07/05/2013  . CORONARY ANGIOPLASTY WITH STENT PLACEMENT  11/24/2012   "3 stents"  . FOOT SURGERY Left 1984   "cyst"  . INGUINAL HERNIA REPAIR Right 1962  . LAPAROSCOPIC APPENDECTOMY N/A 09/19/2016   Procedure: APPENDECTOMY LAPAROSCOPIC;  Surgeon: Ralene Ok, MD;  Location: [facility];  Service: General;  Laterality: N/A;  . LEFT HEART CATHETERIZATION WITH CORONARY ANGIOGRAM N/A 11/24/2012   Procedure: LEFT HEART  CATHETERIZATION WITH CORONARY ANGIOGRAM;  Surgeon: Jacolyn Reedy, MD;  Location: [facility] CATH LAB;  Service: Cardiovascular;  Laterality: N/A;  . LEFT HEART CATHETERIZATION WITH CORONARY ANGIOGRAM N/A 07/05/2013   Procedure: LEFT HEART CATHETERIZATION WITH CORONARY ANGIOGRAM;  Surgeon: Jacolyn Reedy, MD;  Location: [facility] CATH LAB;  Service: Cardiovascular;  Laterality: N/A;  . PERCUTANEOUS CORONARY STENT INTERVENTION (PCI-S)  11/24/2012   Procedure: PERCUTANEOUS CORONARY STENT INTERVENTION (PCI-S);  Surgeon: Jacolyn Reedy, MD;  Location: [facility] CATH LAB;  Service: Cardiovascular;;  . PERCUTANEOUS CORONARY STENT INTERVENTION (PCI-S) Right 07/05/2013   Procedure: PERCUTANEOUS CORONARY STENT INTERVENTION (PCI-S);  Surgeon: Jacolyn Reedy, MD;  Location: [facility] CATH LAB;  Service: Cardiovascular;  Laterality: Right;    Social History   Tobacco Use  Smoking Status Never Smoker  Smokeless Tobacco Never Used    Social History   Substance and Sexual Activity  Alcohol Use No     Allergies  Allergen Reactions  . Shellfish Allergy Anaphylaxis  . Gluten Meal Other (See Comments)    "can't have", "get inside vibration and get weaker"  . Milk-Related Compounds Other (See Comments)    Stomach issues, "get weaker"  . Ramipril Other (See Comments)    Head spinning and heart racing    Current Facility-Administered Medications  Medication Dose Route Frequency Provider Last Rate Last Admin  . 0.9 %  sodium chloride infusion   Intravenous Continuous Barrett, Rhonda G, PA-C      . 0.9 %  sodium chloride infusion   Intravenous Continuous Barrett, Rhonda G, PA-C      . 0.9 %  sodium chloride infusion   Intravenous Continuous Burnell Blanks, MD 75 mL/hr at 07/24/19 1505 Rate Change at 07/24/19 1505  . heparin ADULT infusion 100 units/mL (25000 units/210mL sodium chloride 0.45%)  950 Units/hr Intravenous Continuous Bertis Ruddy, RPH   Stopped at 07/24/19 1347  . hydrALAZINE (APRESOLINE) injection 10  mg  10 mg Intravenous Q20 Min PRN Burnell Blanks, MD   10 mg at 07/24/19 1542  . nitroGLYCERIN 50 mg in dextrose 5 % 250 mL (0.2 mg/mL) infusion  0-200 mcg/min Intravenous Continuous Petrucelli, Samantha R, PA-C 1.5 mL/hr at 07/24/19 0819 5 mcg/min at 07/24/19 0819  . [MAR Hold] sodium chloride flush (NS) 0.9 % injection 3 mL  3 mL Intravenous Once Ward, Kristen N, DO        Medications Prior to Admission  Medication Sig Dispense Refill Last Dose  . aspirin 81 MG chewable tablet Chew 1 tablet (81 mg total) by  mouth daily. 30 tablet 12 07/23/2019 at Unknown time  . Cholecalciferol (VITAMIN D3 PO) Take 1 tablet by mouth daily.   07/23/2019 at Unknown time  . metoprolol succinate (TOPROL-XL) 50 MG 24 hr tablet Take 1 tablet (50 mg total) by mouth daily. Take with or immediately following a meal. 90 tablet 1 07/23/2019 at 2030  . nitroGLYCERIN (NITROSTAT) 0.4 MG SL tablet Place 1 tablet (0.4 mg total) under the tongue every 5 (five) minutes as needed for chest pain. 25 tablet 11 unknown at unknown  . rosuvastatin (CRESTOR) 10 MG tablet Take 1 tablet (10 mg total) by mouth daily. 90 tablet 1 07/23/2019 at Unknown time  . VITAMIN K PO Take 1 tablet by mouth daily.   07/23/2019 at Unknown time    Family History  Problem Relation Age of Onset  . Coronary artery disease Other   . Heart attack Father 24  . Heart attack Mother 92  . Stroke Mother 14     Review of Systems:   Review of Systems  Constitutional: Positive for diaphoresis and malaise/fatigue. Negative for chills, fever and weight loss.  HENT: Negative for congestion, ear discharge, ear pain, hearing loss, nosebleeds, sinus pain, sore throat and tinnitus.   Eyes: Positive for pain. Negative for blurred vision, double vision, photophobia, discharge and redness.  Respiratory: Negative for cough, hemoptysis, sputum production, shortness of breath, wheezing and stridor.   Cardiovascular: Positive for chest pain. Negative for  palpitations, orthopnea, claudication, leg swelling and PND.  Gastrointestinal: Positive for diarrhea and heartburn. Negative for abdominal pain, blood in stool, constipation, melena, nausea and vomiting.       'increased "gas"- belching  Genitourinary: Negative.        Nocturia  Musculoskeletal: Positive for joint pain. Negative for back pain, falls, myalgias and neck pain.       Left knee miniscus injection recently  Skin: Negative.   Neurological: Negative for dizziness, tingling, tremors, sensory change, speech change, focal weakness, seizures, loss of consciousness, weakness and headaches.  Endo/Heme/Allergies: Negative for environmental allergies and polydipsia. Does not bruise/bleed easily.  Psychiatric/Behavioral: Negative.  Negative for depression, hallucinations, memory loss, substance abuse and suicidal ideas. The patient is not nervous/anxious and does not have insomnia.         Physical Exam: BP (!) 169/98   Pulse 63   Temp 97.9 F (36.6 C) (Oral)   Resp 13   Wt 83 kg   SpO2 100%   BMI 25.52 kg/m    Physical Exam  Constitutional: He appears healthy. No distress.  HENT:  Nose: No nasal discharge.  Mouth/Throat: Dentition is normal. No dental caries. Oropharynx is clear. Pharynx is normal.  Eyes: Pupils are equal, round, and reactive to light. Conjunctivae are normal.  Neck: No JVD present. No neck adenopathy. No thyromegaly present.  Cardiovascular: Regular rhythm, S1 normal, S2 normal, normal heart sounds, intact distal pulses and normal pulses. Exam reveals no gallop.  No murmur heard. Pulmonary/Chest: Breath sounds normal. He has no wheezes. He has no rales. He exhibits no tenderness.  Abdominal: Soft. Bowel sounds are normal. He exhibits no distension and no mass. There is no splenomegaly or hepatomegaly. There is no abdominal tenderness.  Musculoskeletal:        General: No tenderness, deformity or edema.     Cervical back: Normal range of motion and neck  supple.  Neurological: He is alert and oriented to person, place, and time.  Skin: Skin is warm and dry. No rash noted.  No cyanosis. No jaundice or pallor. Nails show no clubbing.   Diagnostic Studies & Laboratory data:     Recent Radiology Findings:   DG Chest 2 View  Result Date: 07/24/2019 CLINICAL DATA:  Chest pain since 1 a.m. this morning. EXAM: CHEST - 2 VIEW COMPARISON:  11/23/2012 FINDINGS: The cardiac silhouette, mediastinal and hilar contours are within normal limits. The lungs are clear. No infiltrates, edema or effusions. No pulmonary lesions. The bony thorax is intact. IMPRESSION: No acute cardiopulmonary findings. Electronically Signed   By: Marijo Sanes M.D.   On: 07/24/2019 06:36   CARDIAC CATHETERIZATION  Result Date: 07/24/2019  Prox RCA to Mid RCA lesion is 40% stenosed.  Ost Cx to Prox Cx lesion is 60% stenosed.  1st Mrg lesion is 70% stenosed.  Mid Cx lesion is 80% stenosed.  Ost LAD to Prox LAD lesion is 95% stenosed.  Mid LAD lesion is 20% stenosed.  2nd Diag lesion is 70% stenosed.  There is mild left ventricular systolic dysfunction.  LV end diastolic pressure is normal.  The left ventricular ejection fraction is 45-50% by visual estimate.  There is no mitral valve regurgitation.  1. Severe ostial LAD stenosis. This arises just beyond the ostial Circumflex stent that seems to extend into the left main. Patent mid LAD stent with minimal restenosis. The moderate caliber Diagonal branch that arises from the mid LAD stented segment has moderately severe ostial stenosis. 2. The ostial/proximal Circumflex stented segment is patent with moderate stent restenosis. The stented segment of the ostial OM1 branch is patent with moderately severe stenosis. The mid Circumflex has a severe stenosis just before the takeoff of OM2. 3. The RCA is a large, dominant vessel with mild mid stenosis. 4. Mild segmental LV systolic dysfunction. Recommendations: He has progression of his CAD.  The ostial LAD disease is complicated by the ostial Circumflex stent that extends back into the left main. The stented segment of the ostial/proximal Circumflex and OM has moderate restenosis. The mid Circumflex has severe de novo disease. The Diagonal is affected by the prior mid LAD stent. His anatomy and disease is not favorable for treatment with PCI as any attempt at stenting of the LAD would potentially compromise flow into the Circumflex system. I have reviewed his cath films with Dr. Tamala Julian. Will consult CT surgery for CABG. Resume IV Heparin 6 hours post sheath pull and continue IV NTG. Continue statin, beta blocker and ASA. Will admit to ICU given mild chest pain.     I have independently reviewed the above radiologic studies and discussed with the patient   Recent Lab Findings: Lab Results  Component Value Date   WBC 5.3 07/24/2019   HGB 14.6 07/24/2019   HCT 41.3 07/24/2019   PLT 269 07/24/2019   GLUCOSE 91 07/24/2019   CHOL 146 02/20/2019   TRIG 254 (H) 02/20/2019   HDL 44 02/20/2019   LDLCALC 61 02/20/2019   ALT 23 02/13/2018   AST 27 02/13/2018   NA 127 (L) 07/24/2019   K 4.6 07/24/2019   CL 94 (L) 07/24/2019   CREATININE 1.31 (H) 07/24/2019   BUN 17 07/24/2019   CO2 21 (L) 07/24/2019   INR 1.09 11/24/2012   HGBA1C 5.2 07/06/2013   Procedures  LEFT HEART CATH AND CORONARY ANGIOGRAPHY  Conclusion    Prox RCA to Mid RCA lesion is 40% stenosed.  Ost Cx to Prox Cx lesion is 60% stenosed.  1st Mrg lesion is 70% stenosed.  Mid Cx lesion  is 80% stenosed.  Ost LAD to Prox LAD lesion is 95% stenosed.  Mid LAD lesion is 20% stenosed.  2nd Diag lesion is 70% stenosed.  There is mild left ventricular systolic dysfunction.  LV end diastolic pressure is normal.  The left ventricular ejection fraction is 45-50% by visual estimate.  There is no mitral valve regurgitation.   1. Severe ostial LAD stenosis. This arises just beyond the ostial Circumflex stent that  seems to extend into the left main.  Patent mid LAD stent with minimal restenosis. The moderate caliber Diagonal branch that arises from the mid LAD stented segment has moderately severe ostial stenosis.  2. The ostial/proximal Circumflex stented segment is patent with moderate stent restenosis. The stented segment of the ostial OM1 branch is patent with moderately severe stenosis.  The mid Circumflex has a severe stenosis just before the takeoff of OM2.  3. The RCA is a large, dominant vessel with mild mid stenosis.  4. Mild segmental LV systolic dysfunction.   Recommendations: He has progression of his CAD. The ostial LAD disease is complicated by the ostial Circumflex stent that extends back into the left main. The stented segment of the ostial/proximal Circumflex and OM has moderate restenosis. The mid Circumflex has severe de novo disease. The Diagonal is affected by the prior mid LAD stent. His anatomy and disease is not favorable for treatment with PCI as any attempt at stenting of the LAD would potentially compromise flow into the Circumflex system. I have reviewed his cath films with Dr. Tamala Julian. Will consult CT surgery for CABG. Resume IV Heparin 6 hours post sheath pull and continue IV NTG. Continue statin, beta blocker and ASA. Will admit to ICU given mild chest pain.   Recommendations  Antiplatelet/Anticoag He has progression of his CAD. The ostial LAD disease is complicated by the ostial Circumflex stent that extends back into the left main. The stented segment of the ostial/proximal Circumflex and OM has moderate restenosis. The mid Circumflex has severe de novo disease. The Diagonal is affected by the prior mid LAD stent. His anatomy and disease is not favorable for treatment with PCI as any attempt at stenting of the LAD would potentially compromise flow into the Circumflex system. I have reviewed his cath films with Dr. Tamala Julian. Will consult CT surgery for CABG. Resume IV Heparin 6 hours  post sheath pull and continue IV NTG. Continue statin, beta blocker and ASA. Will admit to ICU given mild chest pain.  Indications  Unstable angina (HCC) [I20.0 (ICD-10-CM)]  Procedural Details  Technical Details Indication: 59 yo male with history of CAD with prior stenting of the mid LAD, ostial Circumflex/OM bifurcation in 2014 admitted with unstable angina.   Procedure: The risks, benefits, complications, treatment options, and expected outcomes were discussed with the patient. The patient and/or family concurred with the proposed plan, giving informed consent. The patient was brought to the cath lab after IV hydration was given. The patient was sedated with Versed and Fentanyl. The right wrist was prepped and draped in a sterile fashion. 1% lidocaine was used for local anesthesia. Using the modified Seldinger access technique, a 5 French sheath was placed in the right radial artery. 3 mg Verapamil was given through the sheath. 5000 units IV heparin was given. Standard diagnostic catheters were used to perform selective coronary angiography. A pigtail catheter was used to perform a left ventricular angiogram. The sheath was removed from the right radial artery and a Terumo hemostasis band was applied at the  arteriotomy site on the right wrist.    Estimated blood loss <50 mL.   During this procedure medications were administered to achieve and maintain moderate conscious sedation while the patient's heart rate, blood pressure, and oxygen saturation were continuously monitored and I was present face-to-face 100% of this time.  Medications (Filter: Administrations occurring from 07/24/19 1346 to 07/24/19 1450) (important)  Continuous medications are totaled by the amount administered until 07/24/19 1450.  fentaNYL (SUBLIMAZE) injection (mcg) Total dose:  50 mcg  Date/Time  Rate/Dose/Volume Action  07/24/19 1411  50 mcg Given    midazolam (VERSED) injection (mg) Total dose:  2 mg  Date/Time   Rate/Dose/Volume Action  07/24/19 1411  2 mg Given    lidocaine (PF) (XYLOCAINE) 1 % injection (mL) Total volume:  2 mL  Date/Time  Rate/Dose/Volume Action  07/24/19 1419  2 mL Given    Heparin (Porcine) in NaCl 1000-0.9 UT/500ML-% SOLN (mL) Total volume:  1,000 mL  Date/Time  Rate/Dose/Volume Action  07/24/19 1419  500 mL Given  1419  500 mL Given    Radial Cocktail/Verapamil only (mL) Total volume:  10 mL  Date/Time  Rate/Dose/Volume Action  07/24/19 1420  10 mL Given    heparin sodium (porcine) injection (Units) Total dose:  5,000 Units  Date/Time  Rate/Dose/Volume Action  07/24/19 1421  5,000 Units Given    iohexol (OMNIPAQUE) 350 MG/ML injection (mL) Total volume:  90 mL  Date/Time  Rate/Dose/Volume Action  07/24/19 1444  90 mL Given    heparin ADULT infusion 100 units/mL (25000 units/278mL sodium chloride 0.45%) (Units/hr) Total dose:  Cannot be calculated* Dosing weight:  83  *Administration dose not documented Date/Time  Rate/Dose/Volume Action  07/24/19 1347   Stopped    sodium chloride flush (NS) 0.9 % injection 3 mL (mL) Total dose:  Cannot be calculated*  *Administration dose not documented Date/Time  Rate/Dose/Volume Action  07/24/19 1346  *Not included in total MAR Hold    Sedation Time  Sedation Time Physician-1: 30 minutes 39 seconds  Contrast  Medication Name Total Dose  iohexol (OMNIPAQUE) 350 MG/ML injection 90 mL    Radiation/Fluoro  Fluoro time: 3.6 (min) DAP: 15.6 (Gycm2) Cumulative Air Kerma: [id number] (mGy)  Complications  Complications documented before study signed (07/24/2019 3:02 PM)   LEFT HEART CATH AND CORONARY ANGIOGRAPHY  None Documented by Burnell Blanks, MD 07/24/2019 2:59 PM  Date Found: 07/24/2019  Time Range: Intraprocedure      Coronary Findings  Diagnostic Dominance: Right Left Anterior Descending  Ost LAD to Prox LAD lesion 95% stenosed  Ost LAD to Prox LAD lesion is 95% stenosed.  Mid LAD  lesion 20% stenosed  Mid LAD lesion is 20% stenosed. The lesion was previously treated using a drug eluting stent over 2 years ago.  Second Diagonal Branch  Vessel is moderate in size.  2nd Diag lesion 70% stenosed  2nd Diag lesion is 70% stenosed.  Left Circumflex  Vessel is large.  Ost Cx to Prox Cx lesion 60% stenosed  Ost Cx to Prox Cx lesion is 60% stenosed. The lesion was previously treated using a drug eluting stent over 2 years ago.  Mid Cx lesion 80% stenosed  Mid Cx lesion is 80% stenosed.  First Obtuse Marginal Branch  Vessel is large in size.  1st Mrg lesion 70% stenosed  1st Mrg lesion is 70% stenosed. The lesion was previously treated using a drug eluting stent over 2 years ago.  Third Obtuse Marginal Branch  Vessel  is moderate in size.  Right Coronary Artery  Vessel is large.  Prox RCA to Mid RCA lesion 40% stenosed  Prox RCA to Mid RCA lesion is 40% stenosed.  Intervention  No interventions have been documented. Wall Motion  Resting               Left Heart  Left Ventricle The left ventricular size is normal. There is mild left ventricular systolic dysfunction. LV end diastolic pressure is normal. The left ventricular ejection fraction is 45-50% by visual estimate. There are LV function abnormalities due to segmental dysfunction. There is no evidence of mitral regurgitation.  Coronary Diagrams  Diagnostic Dominance: Right     Assessment / Plan:  severe CAD , unstable angina, plan CABG    Patient Active Problem List   Diagnosis Date Noted  . CKD (chronic kidney disease) stage 2, GFR 60-89 ml/min 02/13/2018  . S/P appendectomy 09/19/2016  . Chest pain 07/05/2013  . Unstable angina ([place]) 07/05/2013  . Vertigo   . CAD (coronary artery disease) 11/24/2012  . Hyperlipidemia 11/24/2012  . GERD (gastroesophageal reflux disease) 05/08/2010      I  spent 55 minutes counseling the patient face to face.  John Giovanni, PA-C 07/24/2019 3:43  PM   Patient seen and examined, agree with history and physical as noted above. Presented with unstable CP, ruled out for MI. Cath showed severe 2 vessel CAD with critical ostial LAD lesion.  CABG is indicated for survival benefit and relief of symptoms.   I discussed the general nature of the procedure, including the need for general anesthesia, the use of cardiopulmonary bypass, the incisions to be used and the use of drainage tubes postoperatively. We discussed the expected hospital stay, overall recovery and short and long term outcomes. I informed Mr. Schnoor and his family of the indications, risks, benefits and alternatives. They understand the risks include, but are not limited to death, stroke, MI, DVT/PE, bleeding, possible need for transfusion, infections, as well as other organ system dysfunction including respiratory, renal, or GI complications. He is at increased risk for renal complications given his elevated creatinine.  He accepts the risks and agrees to proceed.  For CABG with left radial harvest in AM  Joyclyn Plazola C. Roxan Hockey, MD [facility] [phone]

## 2019-07-24 NOTE — ED Notes (Signed)
Lunch Tray Ordered @ 1110. 

## 2019-07-24 NOTE — ED Provider Notes (Signed)
Medical screening examination/treatment/procedure(s) were conducted as a shared visit with non-physician practitioner(s) and myself.  I personally evaluated the patient during the encounter.  EKG Interpretation  Date/Time:  Tuesday Jul 24 2019 07:58:56 EDT Ventricular Rate:  61 PR Interval:  200 QRS Duration: 105 QT Interval:  434 QTC Calculation: 438 R Axis:   51 Text Interpretation: Sinus rhythm Borderline prolonged PR interval Low voltage, precordial leads Minimal ST elevation, inferior leads similar to earlier in the day Confirmed by Sherwood Gambler [phone]) on 07/24/2019 8:00:21 AM   Patient presents with chest pain.  Feels a little different than his typical angina but also has many similar characteristics.  He is resting comfortably but has pain waxing and waning between 2-5.  ECG is abnormal though there are some similarities to before, but especially concerning his V3 and lead III.  I discussed with Dr. Irish Lack, no indication for acute Cath Lab activation for STEMI, but if his pain cannot be controlled he may need Cath Lab anyway.  Was given nitroglycerin and will be placed on IV heparin and nitro and we will consult cardiology.  First troponin is negative.  Repeat ECG obtained that shows no dynamic changes.   Sherwood Gambler, MD 07/24/19 [phone]

## 2019-07-24 NOTE — Progress Notes (Signed)
Pre op cardiac testing       has been completed. Preliminary results can be found under CV proc through chart review. June Leap, BS, RDMS, RVT

## 2019-07-24 NOTE — ED Triage Notes (Addendum)
To ED for eval of cp that woke pt up this am. States he took gas medicine without relief. States he had pain while exercising on stationary bike yesterday. No nausea or vomiting. Describes pain as a burning pressure and remains located in upper left chest without radiation.

## 2019-07-24 NOTE — H&P (Addendum)
The patient has been seen in conjunction with Rosaria Ferries, PA-C. All aspects of care have been considered and discussed. The patient has been personally interviewed, examined, and all clinical data has been reviewed.   Prior history of coronary disease with LAD stent 2014 and in the same setting a complex culotte circumflex OM1 procedure.  2015 had angioplasty of restenosis in the circumflex beyond the stent site.  Starting approximately 1 week ago he noticed angina with exertion.  Angina awakened him from sleep this morning and he came to the emergency room.  No acute ST-T wave changes on EKG although subtle ST abnormalities are noted.  Discussed the presentation with the patient and his wife.  He needs coronary angiography to reevaluate the circumflex and LAD stents and to exclude de novo disease as the presentation with unstable angina.  IV nitroglycerin for pain control  Needs cath today  The patient was counseled to undergo left heart catheterization, coronary angiography, and possible percutaneous coronary intervention with stent implantation. The procedural risks and benefits were discussed in detail. The risks discussed included death, stroke, myocardial infarction, life-threatening bleeding, limb ischemia, kidney injury, allergy, and possible emergency cardiac surgery. The risk of these significant complications were estimated to occur less than 1% of the time. After discussion, the patient has agreed to proceed.  Prior to discharge, consider adding ARB therapy if kidney function allows.     Cardiology Admission History and Physical:   Patient ID: Tyler Sweeney; MRN: [id number]; DOB: 04-05-1960   Admission date: 07/24/2019  Primary Care Provider: Orpah Melter, MD Primary Cardiologist: Shirlee More, MD 02/20/2019 Primary Electrophysiologist: None    Chief Complaint:  Chest pain  Patient Profile:   Tyler Sweeney is a 59 y.o. male with a history of DES LAD &  bifurcation stenting CFX 2014, PTCA CFX 2015, HLD, polycystic kidney dz, borderline HTN  History of Present Illness:   Tyler Sweeney has been doing well, exercising 4-5 x week w/ cardio and other exercises.  He has not been having ischemic symptoms.  He has no history of exertional chest pain.  His exercise ability has not changed recently.  He had a small amount of chest pain yesterday with exertion, it resolved rest, without intervention or meds.   He was wakened by chest pain at approximately 1:30 AM.  It was his usual angina, a burning and pressure.  It reached a 7/10.  He was slightly diaphoretic with it, but no shortness of breath and nausea.  His symptoms concerned him, so he came to the emergency room.  In the emergency room, he is on heparin and IV nitro.  His pain is currently a 2-3/10.  This is the first time he has had chest pain in a long time.   Past Medical History:  Diagnosis Date  . Anginal pain ([place])   . Asthma    as a child  . CAD (coronary artery disease) 11/24/2012   11/24/12 Cath nl LM, 99%LAD post D1, 100% 1rst OM with collaterals, 40% circumflex, no RCA dz, s/p DES LAD & CFX/OM1 Dr. Tamala Julian    . Hyperlipidemia 11/24/2012  . MI, old 2014   Diagnosis based on left ventricular dysfunction seen on stress test  . Personal history of other diseases of digestive system   . Polycystic kidney disease    "dx'd but have never had any symptoms" (07/05/2013)    Past Surgical History:  Procedure Laterality Date  . COLONOSCOPY  11/02/2011   Procedure: COLONOSCOPY;  Surgeon: Winfield Cunas., MD;  Location: [facility];  Service: Endoscopy;  Laterality: N/A;  . CORONARY ANGIOPLASTY  07/05/2013  . CORONARY ANGIOPLASTY WITH STENT PLACEMENT  11/24/2012   "3 stents"  . FOOT SURGERY Left 1984   "cyst"  . INGUINAL HERNIA REPAIR Right 1962  . LAPAROSCOPIC APPENDECTOMY N/A 09/19/2016   Procedure: APPENDECTOMY LAPAROSCOPIC;  Surgeon: Ralene Ok, MD;  Location: [facility];  Service:  General;  Laterality: N/A;  . LEFT HEART CATHETERIZATION WITH CORONARY ANGIOGRAM N/A 11/24/2012   Procedure: LEFT HEART CATHETERIZATION WITH CORONARY ANGIOGRAM;  Surgeon: Jacolyn Reedy, MD;  Location: [facility] CATH LAB;  Service: Cardiovascular;  Laterality: N/A;  . LEFT HEART CATHETERIZATION WITH CORONARY ANGIOGRAM N/A 07/05/2013   Procedure: LEFT HEART CATHETERIZATION WITH CORONARY ANGIOGRAM;  Surgeon: Jacolyn Reedy, MD;  Location: [facility] CATH LAB;  Service: Cardiovascular;  Laterality: N/A;  . PERCUTANEOUS CORONARY STENT INTERVENTION (PCI-S)  11/24/2012   Procedure: PERCUTANEOUS CORONARY STENT INTERVENTION (PCI-S);  Surgeon: Jacolyn Reedy, MD;  Location: [facility] CATH LAB;  Service: Cardiovascular;;  . PERCUTANEOUS CORONARY STENT INTERVENTION (PCI-S) Right 07/05/2013   Procedure: PERCUTANEOUS CORONARY STENT INTERVENTION (PCI-S);  Surgeon: Jacolyn Reedy, MD;  Location: [facility] CATH LAB;  Service: Cardiovascular;  Laterality: Right;     Medications Prior to Admission: Prior to Admission medications   Medication Sig Start Date End Date Taking? Authorizing Provider  aspirin 81 MG chewable tablet Chew 1 tablet (81 mg total) by mouth daily. 11/25/12   Jerline Pain, MD  metoprolol succinate (TOPROL-XL) 50 MG 24 hr tablet Take 1 tablet (50 mg total) by mouth daily. Take with or immediately following a meal. 02/23/19   Munley, Hilton Cork, MD  nitroGLYCERIN (NITROSTAT) 0.4 MG SL tablet Place 1 tablet (0.4 mg total) under the tongue every 5 (five) minutes as needed for chest pain. 02/13/18   Richardo Priest, MD  rosuvastatin (CRESTOR) 10 MG tablet Take 1 tablet (10 mg total) by mouth daily. 05/07/19   Richardo Priest, MD     Allergies:    Allergies  Allergen Reactions  . Shellfish Allergy Anaphylaxis  . Gluten Meal Other (See Comments)    "can't have", "get inside vibration and get weaker"  . Milk-Related Compounds Other (See Comments)    Stomach issues, "get weaker"  . Ramipril Other (See Comments)    Head  spinning and heart racing    Social History:   Social History   Socioeconomic History  . Marital status: Married    Spouse name: Not on file  . Number of children: Not on file  . Years of education: Not on file  . Highest education level: Not on file  Occupational History  . 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 Smoking status: Never Smoker  . Smokeless tobacco: Never Used  Substance and Sexual Activity  . Alcohol use: No  . Drug use: No  . Sexual activity: Yes  Other Topics Concern  . Not on file  Social History Narrative  . Not on file   Social Determinants of Health   Financial Resource Strain:   . Difficulty of Paying Living Expenses:   Food Insecurity:   . Worried About Charity fundraiser in the Last Year:   . Arboriculturist in the Last Year:   Transportation Needs:   . Film/video editor (Medical):   Marland Kitchen Lack of Transportation (Non-Medical):   Physical Activity:   . Days  of Exercise per Week:   . Minutes of Exercise per Session:   Stress:   . Feeling of Stress :   Social Connections:   . Frequency of Communication with Friends and Family:   . Frequency of Social Gatherings with Friends and Family:   . Attends Religious Services:   . Active Member of Clubs or Organizations:   . Attends Archivist Meetings:   Marland Kitchen Marital Status:   Intimate Partner Violence:   . Fear of Current or Ex-Partner:   . Emotionally Abused:   Marland Kitchen Physically Abused:   . Sexually Abused:     Family History:  The patient's family history includes Coronary artery disease in an other family member; Heart attack (age of onset: 56) in his mother; Heart attack (age of onset: 32) in his father; Stroke (age of onset: 54) in his mother.   The patient He indicated that his mother is deceased. He indicated that his father is deceased. He indicated that the status of his other is unknown.    ROS:  Please see the history of present illness.  All other  ROS reviewed and negative.     Physical Exam/Data:   Vitals:   07/24/19 0826 07/24/19 0827 07/24/19 0830 07/24/19 0845  BP:  (!) 135/95 (!) 155/94 (!) 151/95  Pulse: 67 66 65 63  Resp: 20 18 15 14   Temp:      TempSrc:      SpO2: 99% 100% 100% 100%  Weight:       No intake or output data in the 24 hours ending 07/24/19 0925 Filed Weights   07/24/19 0731  Weight: 83 kg   Body mass index is 25.52 kg/m.  General:  Well nourished, well developed, male in no acute distress HEENT: normal Lymph: no adenopathy Neck:  JVD not elevated Endocrine:  No thryomegaly Vascular: No carotid bruits; 4/4 extremity pulses 2+ bilaterally  Cardiac:  normal S1, S2; RRR; no murmur, no rub or gallop  Lungs:  clear to auscultation bilaterally, no wheezing, rhonchi or rales  Abd: soft, nontender, no hepatomegaly  Ext: No edema Musculoskeletal:  No deformities, BUE and BLE strength normal and equal Skin: warm and dry  Neuro:  CNs 2-12 intact, no focal abnormalities noted Psych:  Normal affect    EKG:  The ECG was personally reviewed:  Initial ECG is sinus rhythm, heart rate 63 at 6:03 AM.  He has mild ST elevation in lead III and in V45 and 6, does not meet STEMI criteria. ECG 7:08 AM is sinus rhythm, heart rate 61, lead III T waves are now biphasic, deeper T wave inversions in aVR, lateral ST elevation is improved ECG 7:58 AM is sinus rhythm, heart rate 61, generally improved but inverted T waves in lead III ECG 9:06 AM is sinus rhythm, heart rate 64, T waves are biphasic again and V3 appears to have less than 1 mm of ST elevation  Telemetry: Sinus rhythm  Relevant CV Studies:  ECHO: None in the system  PCI: 07/05/2013 PROCEDURE TECHNIQUE:  After Xylocaine anesthesia a 50F sheath was placed in the right femoral artery in exchange for the 5 French diagnostic.   Coronary guiding shots were made using a 6 [place] CLS guide catheter. Antithrombotic therapy, Bivalirudin bolus and infusion, was begun and  determined to be therapeutic by ACT. Antiplatelet therapy,Brilinta, was was continued chronically.  Guidewire was used to cross the stenosis in the mid circumflex. It was very little difficulty crossing the stenosis  of prior bifurcation stenting. We then used a 2.5 x 6 mm long cutting balloon and for a 120 second balloon inflation this bilateral antecubital. Does note that his discomfort. The 85% stenosis in the midcircumflex was reduced to 0% with integrated 3 flow.  EQUIPMENT: 2.5 x 6 mm Cutting Balloon  CONTRAST:  Total of 55 cc.  COMPLICATIONS:  None.    ANGIOGRAPHIC RESULTS:   85-90% mid circumflex reduced to 0% with TIMI grade 3 flow   IMPRESSIONS:  Successful cutting balloon angioplasty of a focal eccentric mid circumflex stenosis just distal to previously stented proximal circumflex 85% to 0% with TIMI grade 3 flow.  CATH: 07/05/2013 ANGIOGRAPHIC DATA:    CORONARY ARTERIES:   Arise and distribute normally.  Right dominant. Mild coronary calcification is noted.  Left main coronary artery: Normal  Left anterior descending: Scattered irregularities proximally. The previously placed LAD stent is widely patent. There appears to be an ostial 50% stenosis of the diagonal that arises at the superior portion of the stent.  Circumflex coronary artery: Previously placed stents are widely patent. The stent into the marginal branch is widely patent. There is a mild 30% stenosis at the continuation branch of the circumflex. Just distal to the stents is an 80% eccentric stenosis in the circumflex prior to a second marginal and a third marginal branch.  Right coronary artery: No significant disease. Collateral filling is seen of the distal septal perforator system  LEFT VENTRICULOGRAM: Not performed due to renal insufficiency   IMPRESSIONS: 1. Unstable angina with new stenosis distal to the previously placed stents in the circumflex continuation branch 2. Widely patent LAD  stent and widely patent stent of the circumflex first marginal and continuation branch 3. Normal LVEDP RECOMMENDATION:  Discussed with Dr. Tamala Julian while patient on the table. The patient has had new onset of unstable angina symptoms in addition had significant sweating last night although symptoms had some atypical features do to vertigo and some vomiting. Plan to proceed with percutaneous intervention of the stenosis in the circumflex. Discussed possibility of not getting stent down the artery and just being able to do percutaneous intervention with balloon  PCI: 11/24/2012 PROCEDURE TECHNIQUE: Dr. Wynonia Lawman perform diagnostic catheterization using a 5 French sheath. I consulted with Dr. Wynonia Lawman about the patient in the laboratory.  After discussion we decided to proceed with PCI of the LAD and obtuse marginal if possible. The 5 French sheath was exchanged to a 6 [place] over a guidewire in the right femoral artery.  Coronary guiding shots were made using a 6 French XB LAD 3.5 cm catheter. Antithrombotic therapy, bivalirudin bolus and infusion, was begun and determined to be therapeutic by ACT. Antiplatelet therapy,Brilinta 180 mg., was loaded.  We approached the left anterior descending as our initial target. A standing guidewire was used to cross the stenosis with moderate difficulty. We predilated with a 2. 0 mm x 12 mm balloon. He had initial difficulty with the balloon watermelon seeding. We then exchanged for a 2.5 x 15 mm balloon. We then positioned and deployed a 3.0 x 24 mm long Promus Premier drug-eluting stent. Peak pressure and deployed to 14 atmospheres. The stent was then postdilated with a 3.0 x 20 mm long [place] balloon to 14 atmospheres. A nice result was obtained. Contrast used and duration of procedure was felt to be significantly short enough to attempt obtuse marginal #1 recanalization.  We therefore turned our attention to the first obtuse marginal branch total occlusion. The occlusion was  quite focal and we felt to be crossed without much difficulty. I use a standard floppy wires use in the LAD but they would not cross. I then loaded a Miracle Brother 3 g wire and a 2.0 x 12 mm long over-the-wire balloon. The Miracle Brother wire was then advanced into the total occlusion and with a moderate amount of pressure easily crossed into the large obtuse marginal #1. We then advanced the balloon into the stenosis and predilated. The over-the-wire balloon was used as an exchange catheter. We placed a 300 cm Run-through balloon distally of the first obtuse marginal in exchange for the miracle Brother wire. We removed the over-the-wire balloon and then use a 2.5 x 10 mm long cutting balloon to dilate the ostial lesion in the marginal. This recanalize the vessel but there was significant recoil noted. After giving intracoronary nitroglycerin it became clear that the obtuse marginal was quite large. I then exchanged to 5 cutting balloon for a 3.0 x 10 mm long cutting balloon. 2 inflations were performed to 6 atmospheres. Post cutting balloon angioplasty we noted a dissection that extended into the proximal circumflex just short of the left main. The cutting balloon was removed. A poor wire was placed down the circumflex. A 3.0 x 24 mm long Promus Premier balloon was then positioned and deployed from the ostial circumflex into the large first obtuse marginal to 14 atmospheres. We then post dilated the stent proximally to 3.25 mm in diameter with a 15 mm long [place] balloon to 14 atmospheres. The jail circumflex now had a high-grade obstruction but no reduction in flow. A BMW wire was used to cross the stent struts into the circumflex. The previously, jailed/trapped circumflex wire was removed. We predilated the stent struts with a 2.5 x 12 mm long balloon that we already had on the table. After some consideration we decided to perform culotte stenting. We placed a 2.75 x 20 mm long Promus Premier and deployed to 15  atmospheres. The proximal margin of the stent was overlapped as closely as possible with the previously placed stent in the proximal circumflex. The stent was then post dilated with the 3.25 x 15 mm long [place] balloon to 13 atmospheres. We then used a new wire, BMW, to cross through the just placed stent into the first obtuse marginal. The trapped guidewire in the obtuse marginal was then easily retrieved. A 2.5 x 12 mm long balloon was then used to predilate the stent struts in the direction of the obtuse marginal. We then placed a 2.5 mm x 15 mm long balloon in the direction of the first obtuse marginal and a 3.0 x 15 mm long balloon into the circumflex and formed a final kissing inflation. Each balloon was inflated to 11 atmospheres. The balloon in the direction of the circumflex was an [place] balloon.  Final left coronary images were obtained and the results in all treated segments was felt to be acceptable  CONTRAST:  Total of 500 cc cc.  COMPLICATIONS:  [id number] cc of contrast used and nearly 60 minutes of fluoroscopy time.    ANGIOGRAPHIC RESULTS:   1. 99% mid LAD stenosis reduced to 0% with TIMI grade 3 flow after DES stenting. Post dilated to 3.0 mm in diameter.  2. Bifurcation stenting of the proximal circumflex/obtuse marginal #1 ([place] 0, 1, 1) using the culotte technique reducing a total occlusion of the large first obtuse marginal and 50-60% stenosis in the circumflex to 0% throughout the treated region. TIMI grade  3 flow was noted.   IMPRESSIONS:  1. Complicated and very lengthy multivessel PCI.  2. Successful DES implantation in the mid LAD  3. Successful bifurcation stenting of the proximal circumflex (60%)/first obtuse marginal (chronic occlusion) to 0% throughout the treated region with TIMI grade 3 flow.  RECOMMENDATION:  1. Aggressive IV hydration due to contrast load.  2. One year therapy with aspirin and Brilinta. Then consider switching to palvix and aspirin for an  additional time period.  3. May need treatment of the mid circumflex if symptoms continue.     Laboratory Data:  Chemistry Recent Labs  Lab 07/24/19 0625  NA 127*  K 4.6  CL 94*  CO2 21*  GLUCOSE 91  BUN 17  CREATININE 1.31*  CALCIUM 9.1  GFRNONAA 59*  GFRAA >60  ANIONGAP 12    Lab Results  Component Value Date   ALT 23 02/13/2018   AST 27 02/13/2018   ALKPHOS 89 02/13/2018   BILITOT 0.7 02/13/2018   Hematology Recent Labs  Lab 07/24/19 0625  WBC 5.3  RBC 4.52  HGB 14.6  HCT 41.3  MCV 91.4  MCH 32.3  MCHC 35.4  RDW 12.3  PLT 269   Cardiac Enzymes  High Sensitivity Troponin:   Recent Labs  Lab 07/24/19 0625  TROPONINIHS 9     BNPNo results for input(s): BNP, PROBNP in the last 168 hours.   Lipids:  Lab Results  Component Value Date   CHOL 146 02/20/2019   HDL 44 02/20/2019   LDLCALC 61 02/20/2019   TRIG 254 (H) 02/20/2019   CHOLHDL 3.3 02/20/2019   INR:  Lab Results  Component Value Date   INR 1.09 11/24/2012   INR 0.97 05/07/2010   A1c:  Lab Results  Component Value Date   HGBA1C 5.2 07/06/2013   Thyroid: No results found for: TSH, T3TOTAL, T4TOTAL, THYROIDAB  Radiology/Studies:  DG Chest 2 View  Result Date: 07/24/2019 CLINICAL DATA:  Chest pain since 1 a.m. this morning. EXAM: CHEST - 2 VIEW COMPARISON:  11/23/2012 FINDINGS: The cardiac silhouette, mediastinal and hilar contours are within normal limits. The lungs are clear. No infiltrates, edema or effusions. No pulmonary lesions. The bony thorax is intact. IMPRESSION: No acute cardiopulmonary findings. Electronically Signed   By: Marijo Sanes M.D.   On: 07/24/2019 06:36    Assessment and Plan:   1.  Unstable anginal pain -Symptoms are improved by IV nitroglycerin and heparin, up titrate the nitro as needed -Although cardiac enzymes are negative so far, I am concerned that he is having dynamic EKG changes and feel he needs a cath. - Cardiac catheterization was discussed with  the patient fully. The patient understands that risks include but are not limited to stroke (1 in 1000), death (1 in 19), kidney failure [usually temporary] (1 in 500), bleeding (1 in 200), allergic reaction [possibly serious] (1 in 200).  The patient understands and is willing to proceed.    2.  Hyperlipidemia:  -Dr. Bettina Gavia is following this closely and is looking at his particle size as well. -Per Dr. Joya Gaskins note in December, he has elevated LPA and may need Zetia along with a statin. -If he needs PCI, consider going ahead and adding the Zetia  3.  CKD II-III -In reviewing his labs, a creatinine of 1.3 is the lowest its been in 3 years. -Hydrate and follow -His sodium is 127, that is new.  According to the patient, he does not eat salt.  He has  no symptoms of CHF, he may need to increase the salt in his diet.  4.  Hypertension: -His diastolic has generally been elevated, his systolic is elevated at times as well. -He is on Toprol-XL 50 mg daily -Dr. Bettina Gavia suggested adding an ARB if better blood pressure control as needed, would wait and do that as an outpatient to make sure his renal function is stable post-cath. -Heart rate is in the low 60s so not sure we can increase the beta-blocker -Discussed med changes with MD   Principal Problem:   Unstable angina ([place]) Active Problems:   Hyperlipidemia   CKD (chronic kidney disease) stage 2, GFR 60-89 ml/min     For questions or updates, please contact [facility] Please consult [email or web address] for contact info under Cardiology/STEMI.    SignedRosaria Ferries, PA-C  07/24/2019 9:25 AM

## 2019-07-24 NOTE — H&P (View-Only) (Signed)
[address]       [place] [address]             [phone]        Tyler Sweeney Medical Record [id number] Date of Birth: 07-23-1960  Referring: No ref. provider found Primary Care: Orpah Melter, MD Primary Cardiologist:Brian Bettina Gavia, MD  Chief Complaint:    Chief Complaint  Patient presents with   Chest Pain    History of Present Illness:    Patient is a 59 year old male we are asked to see in cardiothoracic surgical consultation for consideration of CABG.  Patient presented to the emergency room on 07/24/2019 with complaints of chest pain.  The patient has a history of previous LAD stent in 2014 in the setting of a complex culotte circumflex OM1 procedure.  In 2015 he had angioplasty of restenosis of the circumflex beyond the stent site.  More recently the patient has begun to notice angina with exertion.  On today's date it awoke him from sleep in a presented to the emergency department.  He did have subtle ST abnormalities on EKG.  Due to his previous history was felt by cardiology that he should undergo repeat cardiac catheterization which was done this afternoon.  Findings are as described below.  Findings included severe ostial LAD disease stenosis.  This arises just beyond the ostial circumflex stent and appears to extend into the left main.  He has a patent mid LAD stent with minimal stenosis.  The moderate caliber diagonal branch that arises from the mid LAD stented segment has moderately severe ostial stenosis.  The ostial/proximal circumflex stented segment is patent with moderate stent restenosis.  The stented segment of the ostial OM1 branch is patent with moderately severe stenosis.  The mid circumflex has a severe stenosis just beyond the takeoff of OM two.  He has mild segmental LV systolic dysfunction and no significant RCA disease.  Due to the progression and severity of the findings we are asked to see in consideration for CABG as his  best revascularization option at this point.   Current Activity/ Functional Status: Patient is independent with mobility/ambulation, transfers, ADL's, IADL's.   Zubrod Score: At the time of surgery this patients most appropriate activity status/level should be described as: []     0    Normal activity, no symptoms [x]     1    Restricted in physical strenuous activity but ambulatory, able to do out light work []     2    Ambulatory and capable of self care, unable to do work activities, up and about                 more than 50%  Of the time                            []     3    Only limited self care, in bed greater than 50% of waking hours []     4    Completely disabled, no self care, confined to bed or chair []     5    Moribund  Past Medical History:  Diagnosis Date   Anginal pain ([place])    Asthma    as a child   CAD (coronary artery disease) 11/24/2012   11/24/12 Cath nl LM, 99%LAD post D1, 100% 1rst OM with collaterals, 40% circumflex, no RCA dz, s/p DES LAD & CFX/OM1 Dr.  Smith     Hyperlipidemia 11/24/2012   MI, old 2014   Diagnosis based on left ventricular dysfunction seen on stress test   Personal history of other diseases of digestive system    Polycystic kidney disease    "dx'd but have never had any symptoms" (07/05/2013)    Past Surgical History:  Procedure Laterality Date   COLONOSCOPY  11/02/2011   Procedure: COLONOSCOPY;  Surgeon: Winfield Cunas., MD;  Location: [facility];  Service: Endoscopy;  Laterality: N/A;   CORONARY ANGIOPLASTY  07/05/2013   CORONARY ANGIOPLASTY WITH STENT PLACEMENT  11/24/2012   "3 stents"   FOOT SURGERY Left 1984   "cyst"   INGUINAL HERNIA REPAIR Right 1962   LAPAROSCOPIC APPENDECTOMY N/A 09/19/2016   Procedure: APPENDECTOMY LAPAROSCOPIC;  Surgeon: Ralene Ok, MD;  Location: [facility];  Service: General;  Laterality: N/A;   LEFT HEART CATHETERIZATION WITH CORONARY ANGIOGRAM N/A 11/24/2012   Procedure: LEFT HEART  CATHETERIZATION WITH CORONARY ANGIOGRAM;  Surgeon: Jacolyn Reedy, MD;  Location: [facility] CATH LAB;  Service: Cardiovascular;  Laterality: N/A;   LEFT HEART CATHETERIZATION WITH CORONARY ANGIOGRAM N/A 07/05/2013   Procedure: LEFT HEART CATHETERIZATION WITH CORONARY ANGIOGRAM;  Surgeon: Jacolyn Reedy, MD;  Location: [facility] CATH LAB;  Service: Cardiovascular;  Laterality: N/A;   PERCUTANEOUS CORONARY STENT INTERVENTION (PCI-S)  11/24/2012   Procedure: PERCUTANEOUS CORONARY STENT INTERVENTION (PCI-S);  Surgeon: Jacolyn Reedy, MD;  Location: [facility] CATH LAB;  Service: Cardiovascular;;   PERCUTANEOUS CORONARY STENT INTERVENTION (PCI-S) Right 07/05/2013   Procedure: PERCUTANEOUS CORONARY STENT INTERVENTION (PCI-S);  Surgeon: Jacolyn Reedy, MD;  Location: [facility] CATH LAB;  Service: Cardiovascular;  Laterality: Right;    Social History   Tobacco Use  Smoking Status Never Smoker  Smokeless Tobacco Never Used    Social History   Substance and Sexual Activity  Alcohol Use No     Allergies  Allergen Reactions   Shellfish Allergy Anaphylaxis   Gluten Meal Other (See Comments)    "can't have", "get inside vibration and get weaker"   Milk-Related Compounds Other (See Comments)    Stomach issues, "get weaker"   Ramipril Other (See Comments)    Head spinning and heart racing    Current Facility-Administered Medications  Medication Dose Route Frequency Provider Last Rate Last Admin   0.9 %  sodium chloride infusion   Intravenous Continuous Barrett, Rhonda G, PA-C       0.9 %  sodium chloride infusion   Intravenous Continuous Barrett, Rhonda G, PA-C       0.9 %  sodium chloride infusion   Intravenous Continuous Burnell Blanks, MD 75 mL/hr at 07/24/19 1505 Rate Change at 07/24/19 1505   heparin ADULT infusion 100 units/mL (25000 units/260mL sodium chloride 0.45%)  950 Units/hr Intravenous Continuous Bertis Ruddy, RPH   Stopped at 07/24/19 1347   hydrALAZINE (APRESOLINE) injection 10  mg  10 mg Intravenous Q20 Min PRN Burnell Blanks, MD   10 mg at 07/24/19 1542   nitroGLYCERIN 50 mg in dextrose 5 % 250 mL (0.2 mg/mL) infusion  0-200 mcg/min Intravenous Continuous Petrucelli, Samantha R, PA-C 1.5 mL/hr at 07/24/19 0819 5 mcg/min at 07/24/19 0819   [MAR Hold] sodium chloride flush (NS) 0.9 % injection 3 mL  3 mL Intravenous Once Ward, Kristen N, DO        Medications Prior to Admission  Medication Sig Dispense Refill Last Dose   aspirin 81 MG chewable tablet Chew 1 tablet (81 mg total) by  mouth daily. 30 tablet 12 07/23/2019 at Unknown time   Cholecalciferol (VITAMIN D3 PO) Take 1 tablet by mouth daily.   07/23/2019 at Unknown time   metoprolol succinate (TOPROL-XL) 50 MG 24 hr tablet Take 1 tablet (50 mg total) by mouth daily. Take with or immediately following a meal. 90 tablet 1 07/23/2019 at 2030   nitroGLYCERIN (NITROSTAT) 0.4 MG SL tablet Place 1 tablet (0.4 mg total) under the tongue every 5 (five) minutes as needed for chest pain. 25 tablet 11 unknown at unknown   rosuvastatin (CRESTOR) 10 MG tablet Take 1 tablet (10 mg total) by mouth daily. 90 tablet 1 07/23/2019 at Unknown time   VITAMIN K PO Take 1 tablet by mouth daily.   07/23/2019 at Unknown time    Family History  Problem Relation Age of Onset   Coronary artery disease Other    Heart attack Father 77   Heart attack Mother 77   Stroke Mother 32     Review of Systems:   Review of Systems  Constitutional: Positive for diaphoresis and malaise/fatigue. Negative for chills, fever and weight loss.  HENT: Negative for congestion, ear discharge, ear pain, hearing loss, nosebleeds, sinus pain, sore throat and tinnitus.   Eyes: Positive for pain. Negative for blurred vision, double vision, photophobia, discharge and redness.  Respiratory: Negative for cough, hemoptysis, sputum production, shortness of breath, wheezing and stridor.   Cardiovascular: Positive for chest pain. Negative for  palpitations, orthopnea, claudication, leg swelling and PND.  Gastrointestinal: Positive for diarrhea and heartburn. Negative for abdominal pain, blood in stool, constipation, melena, nausea and vomiting.       'increased "gas"- belching  Genitourinary: Negative.        Nocturia  Musculoskeletal: Positive for joint pain. Negative for back pain, falls, myalgias and neck pain.       Left knee miniscus injection recently  Skin: Negative.   Neurological: Negative for dizziness, tingling, tremors, sensory change, speech change, focal weakness, seizures, loss of consciousness, weakness and headaches.  Endo/Heme/Allergies: Negative for environmental allergies and polydipsia. Does not bruise/bleed easily.  Psychiatric/Behavioral: Negative.  Negative for depression, hallucinations, memory loss, substance abuse and suicidal ideas. The patient is not nervous/anxious and does not have insomnia.         Physical Exam: BP (!) 169/98    Pulse 63    Temp 97.9 F (36.6 C) (Oral)    Resp 13    Wt 83 kg    SpO2 100%    BMI 25.52 kg/m    Physical Exam  Constitutional: He appears healthy. No distress.  HENT:  Nose: No nasal discharge.  Mouth/Throat: Dentition is normal. No dental caries. Oropharynx is clear. Pharynx is normal.  Eyes: Pupils are equal, round, and reactive to light. Conjunctivae are normal.  Neck: No JVD present. No neck adenopathy. No thyromegaly present.  Cardiovascular: Regular rhythm, S1 normal, S2 normal, normal heart sounds, intact distal pulses and normal pulses. Exam reveals no gallop.  No murmur heard. Pulmonary/Chest: Breath sounds normal. He has no wheezes. He has no rales. He exhibits no tenderness.  Abdominal: Soft. Bowel sounds are normal. He exhibits no distension and no mass. There is no splenomegaly or hepatomegaly. There is no abdominal tenderness.  Musculoskeletal:        General: No tenderness, deformity or edema.     Cervical back: Normal range of motion and neck  supple.  Neurological: He is alert and oriented to person, place, and time.  Skin: Skin is  warm and dry. No rash noted. No cyanosis. No jaundice or pallor. Nails show no clubbing.   Diagnostic Studies & Laboratory data:     Recent Radiology Findings:   DG Chest 2 View  Result Date: 07/24/2019 CLINICAL DATA:  Chest pain since 1 a.m. this morning. EXAM: CHEST - 2 VIEW COMPARISON:  11/23/2012 FINDINGS: The cardiac silhouette, mediastinal and hilar contours are within normal limits. The lungs are clear. No infiltrates, edema or effusions. No pulmonary lesions. The bony thorax is intact. IMPRESSION: No acute cardiopulmonary findings. Electronically Signed   By: Marijo Sanes M.D.   On: 07/24/2019 06:36   CARDIAC CATHETERIZATION  Result Date: 07/24/2019  Prox RCA to Mid RCA lesion is 40% stenosed.  Ost Cx to Prox Cx lesion is 60% stenosed.  1st Mrg lesion is 70% stenosed.  Mid Cx lesion is 80% stenosed.  Ost LAD to Prox LAD lesion is 95% stenosed.  Mid LAD lesion is 20% stenosed.  2nd Diag lesion is 70% stenosed.  There is mild left ventricular systolic dysfunction.  LV end diastolic pressure is normal.  The left ventricular ejection fraction is 45-50% by visual estimate.  There is no mitral valve regurgitation.  1. Severe ostial LAD stenosis. This arises just beyond the ostial Circumflex stent that seems to extend into the left main. Patent mid LAD stent with minimal restenosis. The moderate caliber Diagonal branch that arises from the mid LAD stented segment has moderately severe ostial stenosis. 2. The ostial/proximal Circumflex stented segment is patent with moderate stent restenosis. The stented segment of the ostial OM1 branch is patent with moderately severe stenosis. The mid Circumflex has a severe stenosis just before the takeoff of OM2. 3. The RCA is a large, dominant vessel with mild mid stenosis. 4. Mild segmental LV systolic dysfunction. Recommendations: He has progression of his CAD.  The ostial LAD disease is complicated by the ostial Circumflex stent that extends back into the left main. The stented segment of the ostial/proximal Circumflex and OM has moderate restenosis. The mid Circumflex has severe de novo disease. The Diagonal is affected by the prior mid LAD stent. His anatomy and disease is not favorable for treatment with PCI as any attempt at stenting of the LAD would potentially compromise flow into the Circumflex system. I have reviewed his cath films with Dr. Tamala Julian. Will consult CT surgery for CABG. Resume IV Heparin 6 hours post sheath pull and continue IV NTG. Continue statin, beta blocker and ASA. Will admit to ICU given mild chest pain.     I have independently reviewed the above radiologic studies and discussed with the patient   Recent Lab Findings: Lab Results  Component Value Date   WBC 5.3 07/24/2019   HGB 14.6 07/24/2019   HCT 41.3 07/24/2019   PLT 269 07/24/2019   GLUCOSE 91 07/24/2019   CHOL 146 02/20/2019   TRIG 254 (H) 02/20/2019   HDL 44 02/20/2019   LDLCALC 61 02/20/2019   ALT 23 02/13/2018   AST 27 02/13/2018   NA 127 (L) 07/24/2019   K 4.6 07/24/2019   CL 94 (L) 07/24/2019   CREATININE 1.31 (H) 07/24/2019   BUN 17 07/24/2019   CO2 21 (L) 07/24/2019   INR 1.09 11/24/2012   HGBA1C 5.2 07/06/2013   Procedures  LEFT HEART CATH AND CORONARY ANGIOGRAPHY  Conclusion    Prox RCA to Mid RCA lesion is 40% stenosed.  Ost Cx to Prox Cx lesion is 60% stenosed.  1st Mrg lesion is  70% stenosed.  Mid Cx lesion is 80% stenosed.  Ost LAD to Prox LAD lesion is 95% stenosed.  Mid LAD lesion is 20% stenosed.  2nd Diag lesion is 70% stenosed.  There is mild left ventricular systolic dysfunction.  LV end diastolic pressure is normal.  The left ventricular ejection fraction is 45-50% by visual estimate.  There is no mitral valve regurgitation.   1. Severe ostial LAD stenosis. This arises just beyond the ostial Circumflex stent that  seems to extend into the left main.  Patent mid LAD stent with minimal restenosis. The moderate caliber Diagonal branch that arises from the mid LAD stented segment has moderately severe ostial stenosis.  2. The ostial/proximal Circumflex stented segment is patent with moderate stent restenosis. The stented segment of the ostial OM1 branch is patent with moderately severe stenosis.  The mid Circumflex has a severe stenosis just before the takeoff of OM2.  3. The RCA is a large, dominant vessel with mild mid stenosis.  4. Mild segmental LV systolic dysfunction.   Recommendations: He has progression of his CAD. The ostial LAD disease is complicated by the ostial Circumflex stent that extends back into the left main. The stented segment of the ostial/proximal Circumflex and OM has moderate restenosis. The mid Circumflex has severe de novo disease. The Diagonal is affected by the prior mid LAD stent. His anatomy and disease is not favorable for treatment with PCI as any attempt at stenting of the LAD would potentially compromise flow into the Circumflex system. I have reviewed his cath films with Dr. Tamala Julian. Will consult CT surgery for CABG. Resume IV Heparin 6 hours post sheath pull and continue IV NTG. Continue statin, beta blocker and ASA. Will admit to ICU given mild chest pain.   Recommendations  Antiplatelet/Anticoag He has progression of his CAD. The ostial LAD disease is complicated by the ostial Circumflex stent that extends back into the left main. The stented segment of the ostial/proximal Circumflex and OM has moderate restenosis. The mid Circumflex has severe de novo disease. The Diagonal is affected by the prior mid LAD stent. His anatomy and disease is not favorable for treatment with PCI as any attempt at stenting of the LAD would potentially compromise flow into the Circumflex system. I have reviewed his cath films with Dr. Tamala Julian. Will consult CT surgery for CABG. Resume IV Heparin 6 hours  post sheath pull and continue IV NTG. Continue statin, beta blocker and ASA. Will admit to ICU given mild chest pain.  Indications  Unstable angina (HCC) [I20.0 (ICD-10-CM)]  Procedural Details  Technical Details Indication: 59 yo male with history of CAD with prior stenting of the mid LAD, ostial Circumflex/OM bifurcation in 2014 admitted with unstable angina.   Procedure: The risks, benefits, complications, treatment options, and expected outcomes were discussed with the patient. The patient and/or family concurred with the proposed plan, giving informed consent. The patient was brought to the cath lab after IV hydration was given. The patient was sedated with Versed and Fentanyl. The right wrist was prepped and draped in a sterile fashion. 1% lidocaine was used for local anesthesia. Using the modified Seldinger access technique, a 5 French sheath was placed in the right radial artery. 3 mg Verapamil was given through the sheath. 5000 units IV heparin was given. Standard diagnostic catheters were used to perform selective coronary angiography. A pigtail catheter was used to perform a left ventricular angiogram. The sheath was removed from the right radial artery and a Terumo  hemostasis band was applied at the arteriotomy site on the right wrist.    Estimated blood loss <50 mL.   During this procedure medications were administered to achieve and maintain moderate conscious sedation while the patient's heart rate, blood pressure, and oxygen saturation were continuously monitored and I was present face-to-face 100% of this time.  Medications (Filter: Administrations occurring from 07/24/19 1346 to 07/24/19 1450) (important)  Continuous medications are totaled by the amount administered until 07/24/19 1450.  fentaNYL (SUBLIMAZE) injection (mcg) Total dose:  50 mcg  Date/Time  Rate/Dose/Volume Action  07/24/19 1411  50 mcg Given    midazolam (VERSED) injection (mg) Total dose:  2 mg  Date/Time   Rate/Dose/Volume Action  07/24/19 1411  2 mg Given    lidocaine (PF) (XYLOCAINE) 1 % injection (mL) Total volume:  2 mL  Date/Time  Rate/Dose/Volume Action  07/24/19 1419  2 mL Given    Heparin (Porcine) in NaCl 1000-0.9 UT/500ML-% SOLN (mL) Total volume:  1,000 mL  Date/Time  Rate/Dose/Volume Action  07/24/19 1419  500 mL Given  1419  500 mL Given    Radial Cocktail/Verapamil only (mL) Total volume:  10 mL  Date/Time  Rate/Dose/Volume Action  07/24/19 1420  10 mL Given    heparin sodium (porcine) injection (Units) Total dose:  5,000 Units  Date/Time  Rate/Dose/Volume Action  07/24/19 1421  5,000 Units Given    iohexol (OMNIPAQUE) 350 MG/ML injection (mL) Total volume:  90 mL  Date/Time  Rate/Dose/Volume Action  07/24/19 1444  90 mL Given    heparin ADULT infusion 100 units/mL (25000 units/242mL sodium chloride 0.45%) (Units/hr) Total dose:  Cannot be calculated* Dosing weight:  83  *Administration dose not documented Date/Time  Rate/Dose/Volume Action  07/24/19 1347   Stopped    sodium chloride flush (NS) 0.9 % injection 3 mL (mL) Total dose:  Cannot be calculated*  *Administration dose not documented Date/Time  Rate/Dose/Volume Action  07/24/19 1346  *Not included in total MAR Hold    Sedation Time  Sedation Time Physician-1: 30 minutes 39 seconds  Contrast  Medication Name Total Dose  iohexol (OMNIPAQUE) 350 MG/ML injection 90 mL    Radiation/Fluoro  Fluoro time: 3.6 (min) DAP: 15.6 (Gycm2) Cumulative Air Kerma: [id number] (mGy)  Complications  Complications documented before study signed (07/24/2019 3:02 PM)   LEFT HEART CATH AND CORONARY ANGIOGRAPHY  None Documented by Burnell Blanks, MD 07/24/2019 2:59 PM  Date Found: 07/24/2019  Time Range: Intraprocedure      Coronary Findings  Diagnostic Dominance: Right Left Anterior Descending  Ost LAD to Prox LAD lesion 95% stenosed  Ost LAD to Prox LAD lesion is 95% stenosed.  Mid LAD  lesion 20% stenosed  Mid LAD lesion is 20% stenosed. The lesion was previously treated using a drug eluting stent over 2 years ago.  Second Diagonal Branch  Vessel is moderate in size.  2nd Diag lesion 70% stenosed  2nd Diag lesion is 70% stenosed.  Left Circumflex  Vessel is large.  Ost Cx to Prox Cx lesion 60% stenosed  Ost Cx to Prox Cx lesion is 60% stenosed. The lesion was previously treated using a drug eluting stent over 2 years ago.  Mid Cx lesion 80% stenosed  Mid Cx lesion is 80% stenosed.  First Obtuse Marginal Branch  Vessel is large in size.  1st Mrg lesion 70% stenosed  1st Mrg lesion is 70% stenosed. The lesion was previously treated using a drug eluting stent over 2 years ago.  Third Obtuse Marginal Branch  Vessel is moderate in size.  Right Coronary Artery  Vessel is large.  Prox RCA to Mid RCA lesion 40% stenosed  Prox RCA to Mid RCA lesion is 40% stenosed.  Intervention  No interventions have been documented. Wall Motion  Resting               Left Heart  Left Ventricle The left ventricular size is normal. There is mild left ventricular systolic dysfunction. LV end diastolic pressure is normal. The left ventricular ejection fraction is 45-50% by visual estimate. There are LV function abnormalities due to segmental dysfunction. There is no evidence of mitral regurgitation.  Coronary Diagrams  Diagnostic Dominance: Right     Assessment / Plan:  severe CAD , unstable angina, plan CABG    Patient Active Problem List   Diagnosis Date Noted   CKD (chronic kidney disease) stage 2, GFR 60-89 ml/min 02/13/2018   S/P appendectomy 09/19/2016   Chest pain 07/05/2013   Unstable angina (HCC) 07/05/2013   Vertigo    CAD (coronary artery disease) 11/24/2012   Hyperlipidemia 11/24/2012   GERD (gastroesophageal reflux disease) 05/08/2010      I  spent 55 minutes counseling the patient face to face.  John Giovanni, PA-C 07/24/2019 3:43  PM   Patient seen and examined, agree with history and physical as noted above. Presented with unstable CP, ruled out for MI. Cath showed severe 2 vessel CAD with critical ostial LAD lesion.  CABG is indicated for survival benefit and relief of symptoms.   I discussed the general nature of the procedure, including the need for general anesthesia, the use of cardiopulmonary bypass, the incisions to be used and the use of drainage tubes postoperatively. We discussed the expected hospital stay, overall recovery and short and long term outcomes. I informed Mr. Gillogly and his family of the indications, risks, benefits and alternatives. They understand the risks include, but are not limited to death, stroke, MI, DVT/PE, bleeding, possible need for transfusion, infections, as well as other organ system dysfunction including respiratory, renal, or GI complications. He is at increased risk for renal complications given his elevated creatinine.  He accepts the risks and agrees to proceed.  For CABG with left radial harvest in AM  Paulla Mcclaskey C. Roxan Hockey, MD [facility] [phone]

## 2019-07-24 NOTE — Progress Notes (Signed)
TCTS consulted for CABG evaluation. °

## 2019-07-24 NOTE — Interval H&P Note (Signed)
History and Physical Interval Note:  07/24/2019 2:16 PM  Tyler Sweeney  has presented today for surgery, with the diagnosis of chest pain.  The various methods of treatment have been discussed with the patient and family. After consideration of risks, benefits and other options for treatment, the patient has consented to  Procedure(s): LEFT HEART CATH AND CORONARY ANGIOGRAPHY (N/A) as a surgical intervention.  The patient's history has been reviewed, patient examined, no change in status, stable for surgery.  I have reviewed the patient's chart and labs.  Questions were answered to the patient's satisfaction.   Cath Lab Visit (complete for each Cath Lab visit)  Clinical Evaluation Leading to the Procedure:   ACS: No.  Non-ACS:    Anginal Classification: CCS III  Anti-ischemic medical therapy: No Therapy  Non-Invasive Test Results: No non-invasive testing performed  Prior CABG: No previous CABG        Lauree Chandler

## 2019-07-24 NOTE — Progress Notes (Signed)
  Echocardiogram 2D Echocardiogram has been performed.  Tyler Sweeney 07/24/2019, 5:18 PM

## 2019-07-24 NOTE — Anesthesia Preprocedure Evaluation (Addendum)
Anesthesia Evaluation  Patient identified by MRN, date of birth, ID band Patient awake    Reviewed: Allergy & Precautions, NPO status , Patient's Chart, lab work & pertinent test results  Airway Mallampati: II  TM Distance: >3 FB Neck ROM: Full    Dental  (+) Teeth Intact, Dental Advisory Given   Pulmonary asthma ,    Pulmonary exam normal breath sounds clear to auscultation       Cardiovascular hypertension, Pt. on home beta blockers + angina + CAD, + Past MI and + Cardiac Stents  Normal cardiovascular exam Rhythm:Regular Rate:Normal  Echo 07/24/19: 1. Left ventricular ejection fraction, by estimation, is 55 to 60%. The  left ventricle has normal function. The left ventricle has no regional  wall motion abnormalities. There is mild left ventricular hypertrophy.  Left ventricular diastolic parameters  are consistent with Grade I diastolic dysfunction (impaired relaxation).  2. Right ventricular systolic function is normal. The right ventricular  size is normal.  3. Moderate pleural effusion in the left lateral region.  4. The mitral valve is abnormal. Trivial mitral valve regurgitation.  5. The aortic valve is tricuspid. Aortic valve regurgitation is not  visualized.  6. The inferior vena cava is normal in size with greater than 50%  respiratory variability, suggesting right atrial pressure of 3 mmHg.    Neuro/Psych negative neurological ROS  negative psych ROS   GI/Hepatic Neg liver ROS, GERD  ,  Endo/Other  negative endocrine ROS  Renal/GU Renal disease (Polycystic kidney disease)     Musculoskeletal negative musculoskeletal ROS (+)   Abdominal   Peds  Hematology negative hematology ROS (+)   Anesthesia Other Findings   Reproductive/Obstetrics                            Anesthesia Physical Anesthesia Plan  ASA: IV  Anesthesia Plan: General   Post-op Pain Management:     Induction: Intravenous  PONV Risk Score and Plan: 2 and Midazolam and Treatment may vary due to age or medical condition  Airway Management Planned: Oral ETT  Additional Equipment: Arterial line, CVP, PA Cath, TEE and Ultrasound Guidance Line Placement  Intra-op Plan:   Post-operative Plan: Post-operative intubation/ventilation  Informed Consent: I have reviewed the patients History and Physical, chart, labs and discussed the procedure including the risks, benefits and alternatives for the proposed anesthesia with the patient or authorized representative who has indicated his/her understanding and acceptance.     Dental advisory given and Dental Advisory Given  Plan Discussed with: CRNA, Anesthesiologist and Surgeon  Anesthesia Plan Comments:       Anesthesia Quick Evaluation

## 2019-07-24 NOTE — Progress Notes (Signed)
ANTICOAGULATION CONSULT NOTE - Initial Consult  Pharmacy Consult for heparin Indication: chest pain/ACS  Allergies  Allergen Reactions  . Shellfish Allergy Anaphylaxis  . Gluten Meal Other (See Comments)    "can't have", "get inside vibration and get weaker"  . Milk-Related Compounds Other (See Comments)    Stomach issues, "get weaker"  . Ramipril Other (See Comments)    Head spinning and heart racing    Patient Measurements: Weight: 83 kg (182 lb 15.7 oz) Heparin Dosing Weight: TBW  Vital Signs: Temp: 97.9 F (36.6 C) (05/18 0617) Temp Source: Oral (05/18 0617) BP: 148/99 (05/18 0731) Pulse Rate: 61 (05/18 0731)  Labs: Recent Labs    07/24/19 0625  HGB 14.6  HCT 41.3  PLT 269  CREATININE 1.31*  TROPONINIHS 9    Estimated Creatinine Clearance: 64.7 mL/min (A) (by C-G formula based on SCr of 1.31 mg/dL (H)).   Medical History: Past Medical History:  Diagnosis Date  . Anginal pain ([place])   . Asthma    "stopped at age 82"  . CAD (coronary artery disease) 11/24/2012   11/24/12 Cath normal LM, 99%LAD post diagonal, 100% 1rst OM with collaterals, 40% circumflex, no RCA Stent LAD Dr. Tamala Julian    . Hyperlipidemia 11/24/2012  . MI, old    "don't know when"  . Personal history of other diseases of digestive system   . Polycystic kidney disease    "dx'd but have never had any symptoms" (07/05/2013)   Assessment: 59 YOM presenting with CP, hx of CAD (s/p PCI 2014).  No anticoagulation PTA, CBC wnl.    Goal of Therapy:  Heparin level 0.3-0.7 units/ml Monitor platelets by anticoagulation protocol: Yes   Plan:  Heparin 4000 units IV x 1, and gtt at 950 units/hr F/u 8 hour heparin level  Bertis Ruddy, PharmD Clinical Pharmacist ED Pharmacist Phone # [phone] 07/24/2019 7:49 AM

## 2019-07-25 ENCOUNTER — Inpatient Hospital Stay (HOSPITAL_COMMUNITY): Payer: Commercial Managed Care - PPO | Admitting: Anesthesiology

## 2019-07-25 ENCOUNTER — Inpatient Hospital Stay (HOSPITAL_COMMUNITY)
Admission: EM | Disposition: A | Discharge status: Home / Self Care | Payer: Self-pay | Source: Home / Self Care | Attending: Thoracic Surgery (Cardiothoracic Vascular Surgery)

## 2019-07-25 ENCOUNTER — Inpatient Hospital Stay (HOSPITAL_COMMUNITY): Payer: Commercial Managed Care - PPO

## 2019-07-25 DIAGNOSIS — Z951 Presence of aortocoronary bypass graft: Secondary | ICD-10-CM

## 2019-07-25 DIAGNOSIS — I25110 Atherosclerotic heart disease of native coronary artery with unstable angina pectoris: Secondary | ICD-10-CM

## 2019-07-25 HISTORY — PX: TEE WITHOUT CARDIOVERSION: SHX5443

## 2019-07-25 HISTORY — PX: CORONARY ARTERY BYPASS GRAFT: SHX141

## 2019-07-25 HISTORY — DX: Presence of aortocoronary bypass graft: Z95.1

## 2019-07-25 HISTORY — PX: ENDOVEIN HARVEST OF GREATER SAPHENOUS VEIN: SHX5059

## 2019-07-25 LAB — POCT I-STAT, CHEM 8
BUN: 15 mg/dL (ref 6–20)
BUN: 14 mg/dL (ref 6–20)
BUN: 13 mg/dL (ref 6–20)
BUN: 14 mg/dL (ref 6–20)
BUN: 13 mg/dL (ref 6–20)
Calcium, Ion: 1.29 mmol/L (ref 1.15–1.40)
Calcium, Ion: 1.29 mmol/L (ref 1.15–1.40)
Calcium, Ion: 1.12 mmol/L — ABNORMAL LOW (ref 1.15–1.40)
Calcium, Ion: 1.17 mmol/L (ref 1.15–1.40)
Calcium, Ion: 1.14 mmol/L — ABNORMAL LOW (ref 1.15–1.40)
Chloride: 100 mmol/L (ref 98–111)
Chloride: 101 mmol/L (ref 98–111)
Chloride: 99 mmol/L (ref 98–111)
Chloride: 99 mmol/L (ref 98–111)
Chloride: 100 mmol/L (ref 98–111)
Creatinine, Ser: 1.3 mg/dL — ABNORMAL HIGH (ref 0.61–1.24)
Creatinine, Ser: 1.2 mg/dL (ref 0.61–1.24)
Creatinine, Ser: 1.2 mg/dL (ref 0.61–1.24)
Creatinine, Ser: 1.2 mg/dL (ref 0.61–1.24)
Creatinine, Ser: 1.1 mg/dL (ref 0.61–1.24)
Glucose, Bld: 89 mg/dL (ref 70–99)
Glucose, Bld: 91 mg/dL (ref 70–99)
Glucose, Bld: 98 mg/dL (ref 70–99)
Glucose, Bld: 114 mg/dL — ABNORMAL HIGH (ref 70–99)
Glucose, Bld: 99 mg/dL (ref 70–99)
HCT: 36 % — ABNORMAL LOW (ref 39.0–52.0)
HCT: 32 % — ABNORMAL LOW (ref 39.0–52.0)
HCT: 27 % — ABNORMAL LOW (ref 39.0–52.0)
HCT: 27 % — ABNORMAL LOW (ref 39.0–52.0)
HCT: 27 % — ABNORMAL LOW (ref 39.0–52.0)
Hemoglobin: 12.2 g/dL — ABNORMAL LOW (ref 13.0–17.0)
Hemoglobin: 10.9 g/dL — ABNORMAL LOW (ref 13.0–17.0)
Hemoglobin: 9.2 g/dL — ABNORMAL LOW (ref 13.0–17.0)
Hemoglobin: 9.2 g/dL — ABNORMAL LOW (ref 13.0–17.0)
Hemoglobin: 9.2 g/dL — ABNORMAL LOW (ref 13.0–17.0)
Potassium: 4 mmol/L (ref 3.5–5.1)
Potassium: 4 mmol/L (ref 3.5–5.1)
Potassium: 4.6 mmol/L (ref 3.5–5.1)
Potassium: 5.5 mmol/L — ABNORMAL HIGH (ref 3.5–5.1)
Potassium: 4.8 mmol/L (ref 3.5–5.1)
Sodium: 133 mmol/L — ABNORMAL LOW (ref 135–145)
Sodium: 134 mmol/L — ABNORMAL LOW (ref 135–145)
Sodium: 132 mmol/L — ABNORMAL LOW (ref 135–145)
Sodium: 130 mmol/L — ABNORMAL LOW (ref 135–145)
Sodium: 133 mmol/L — ABNORMAL LOW (ref 135–145)
TCO2: 24 mmol/L (ref 22–32)
TCO2: 24 mmol/L (ref 22–32)
TCO2: 24 mmol/L (ref 22–32)
TCO2: 24 mmol/L (ref 22–32)
TCO2: 22 mmol/L (ref 22–32)

## 2019-07-25 LAB — POCT I-STAT 7, (LYTES, BLD GAS, ICA,H+H)
Acid-Base Excess: 7 mmol/L — ABNORMAL HIGH (ref 0.0–2.0)
Acid-base deficit: 2 mmol/L (ref 0.0–2.0)
Acid-base deficit: 1 mmol/L (ref 0.0–2.0)
Acid-base deficit: 5 mmol/L — ABNORMAL HIGH (ref 0.0–2.0)
Acid-base deficit: 7 mmol/L — ABNORMAL HIGH (ref 0.0–2.0)
Acid-base deficit: 7 mmol/L — ABNORMAL HIGH (ref 0.0–2.0)
Bicarbonate: 23.2 mmol/L (ref 20.0–28.0)
Bicarbonate: 24.2 mmol/L (ref 20.0–28.0)
Bicarbonate: 30.6 mmol/L — ABNORMAL HIGH (ref 20.0–28.0)
Bicarbonate: 21 mmol/L (ref 20.0–28.0)
Bicarbonate: 18.3 mmol/L — ABNORMAL LOW (ref 20.0–28.0)
Bicarbonate: 18.6 mmol/L — ABNORMAL LOW (ref 20.0–28.0)
Calcium, Ion: 1.32 mmol/L (ref 1.15–1.40)
Calcium, Ion: 1.11 mmol/L — ABNORMAL LOW (ref 1.15–1.40)
Calcium, Ion: 1.03 mmol/L — ABNORMAL LOW (ref 1.15–1.40)
Calcium, Ion: 1.15 mmol/L (ref 1.15–1.40)
Calcium, Ion: 1.21 mmol/L (ref 1.15–1.40)
Calcium, Ion: 1.2 mmol/L (ref 1.15–1.40)
HCT: 36 % — ABNORMAL LOW (ref 39.0–52.0)
HCT: 27 % — ABNORMAL LOW (ref 39.0–52.0)
HCT: 26 % — ABNORMAL LOW (ref 39.0–52.0)
HCT: 29 % — ABNORMAL LOW (ref 39.0–52.0)
HCT: 27 % — ABNORMAL LOW (ref 39.0–52.0)
HCT: 27 % — ABNORMAL LOW (ref 39.0–52.0)
Hemoglobin: 12.2 g/dL — ABNORMAL LOW (ref 13.0–17.0)
Hemoglobin: 9.2 g/dL — ABNORMAL LOW (ref 13.0–17.0)
Hemoglobin: 8.8 g/dL — ABNORMAL LOW (ref 13.0–17.0)
Hemoglobin: 9.9 g/dL — ABNORMAL LOW (ref 13.0–17.0)
Hemoglobin: 9.2 g/dL — ABNORMAL LOW (ref 13.0–17.0)
Hemoglobin: 9.2 g/dL — ABNORMAL LOW (ref 13.0–17.0)
O2 Saturation: 99 %
O2 Saturation: 100 %
O2 Saturation: 100 %
O2 Saturation: 98 %
O2 Saturation: 98 %
O2 Saturation: 99 %
Patient temperature: 35.5
Patient temperature: 37.4
Patient temperature: 37.9
Potassium: 3.9 mmol/L (ref 3.5–5.1)
Potassium: 4.1 mmol/L (ref 3.5–5.1)
Potassium: 4.9 mmol/L (ref 3.5–5.1)
Potassium: 4.3 mmol/L (ref 3.5–5.1)
Potassium: 4.3 mmol/L (ref 3.5–5.1)
Potassium: 4.2 mmol/L (ref 3.5–5.1)
Sodium: 134 mmol/L — ABNORMAL LOW (ref 135–145)
Sodium: 133 mmol/L — ABNORMAL LOW (ref 135–145)
Sodium: 138 mmol/L (ref 135–145)
Sodium: 135 mmol/L (ref 135–145)
Sodium: 135 mmol/L (ref 135–145)
Sodium: 135 mmol/L (ref 135–145)
TCO2: 24 mmol/L (ref 22–32)
TCO2: 25 mmol/L (ref 22–32)
TCO2: 32 mmol/L (ref 22–32)
TCO2: 22 mmol/L (ref 22–32)
TCO2: 19 mmol/L — ABNORMAL LOW (ref 22–32)
TCO2: 20 mmol/L — ABNORMAL LOW (ref 22–32)
pCO2 arterial: 41.2 mmHg (ref 32.0–48.0)
pCO2 arterial: 40.7 mmHg (ref 32.0–48.0)
pCO2 arterial: 38 mmHg (ref 32.0–48.0)
pCO2 arterial: 38.4 mmHg (ref 32.0–48.0)
pCO2 arterial: 34.6 mmHg (ref 32.0–48.0)
pCO2 arterial: 37.8 mmHg (ref 32.0–48.0)
pH, Arterial: 7.359 (ref 7.350–7.450)
pH, Arterial: 7.382 (ref 7.350–7.450)
pH, Arterial: 7.514 — ABNORMAL HIGH (ref 7.350–7.450)
pH, Arterial: 7.338 — ABNORMAL LOW (ref 7.350–7.450)
pH, Arterial: 7.333 — ABNORMAL LOW (ref 7.350–7.450)
pH, Arterial: 7.303 — ABNORMAL LOW (ref 7.350–7.450)
pO2, Arterial: 129 mmHg — ABNORMAL HIGH (ref 83.0–108.0)
pO2, Arterial: 390 mmHg — ABNORMAL HIGH (ref 83.0–108.0)
pO2, Arterial: 307 mmHg — ABNORMAL HIGH (ref 83.0–108.0)
pO2, Arterial: 102 mmHg (ref 83.0–108.0)
pO2, Arterial: 111 mmHg — ABNORMAL HIGH (ref 83.0–108.0)
pO2, Arterial: 174 mmHg — ABNORMAL HIGH (ref 83.0–108.0)

## 2019-07-25 LAB — CBC
HCT: 37.9 % — ABNORMAL LOW (ref 39.0–52.0)
HCT: 30.5 % — ABNORMAL LOW (ref 39.0–52.0)
HCT: 28.6 % — ABNORMAL LOW (ref 39.0–52.0)
Hemoglobin: 13.4 g/dL (ref 13.0–17.0)
Hemoglobin: 10.8 g/dL — ABNORMAL LOW (ref 13.0–17.0)
Hemoglobin: 10 g/dL — ABNORMAL LOW (ref 13.0–17.0)
MCH: 31.9 pg (ref 26.0–34.0)
MCH: 32.7 pg (ref 26.0–34.0)
MCH: 32.5 pg (ref 26.0–34.0)
MCHC: 35.4 g/dL (ref 30.0–36.0)
MCHC: 35.4 g/dL (ref 30.0–36.0)
MCHC: 35 g/dL (ref 30.0–36.0)
MCV: 90.2 fL (ref 80.0–100.0)
MCV: 92.4 fL (ref 80.0–100.0)
MCV: 92.9 fL (ref 80.0–100.0)
Platelets: 247 10*3/uL (ref 150–400)
Platelets: 160 10*3/uL (ref 150–400)
Platelets: 133 10*3/uL — ABNORMAL LOW (ref 150–400)
RBC: 4.2 MIL/uL — ABNORMAL LOW (ref 4.22–5.81)
RBC: 3.3 MIL/uL — ABNORMAL LOW (ref 4.22–5.81)
RBC: 3.08 MIL/uL — ABNORMAL LOW (ref 4.22–5.81)
RDW: 12.2 % (ref 11.5–15.5)
RDW: 12.5 % (ref 11.5–15.5)
RDW: 12.7 % (ref 11.5–15.5)
WBC: 7.7 10*3/uL (ref 4.0–10.5)
WBC: 11.1 10*3/uL — ABNORMAL HIGH (ref 4.0–10.5)
WBC: 6.4 10*3/uL (ref 4.0–10.5)
nRBC: 0 % (ref 0.0–0.2)
nRBC: 0 % (ref 0.0–0.2)
nRBC: 0 % (ref 0.0–0.2)

## 2019-07-25 LAB — HEMOGLOBIN AND HEMATOCRIT, BLOOD
HCT: 27.2 % — ABNORMAL LOW (ref 39.0–52.0)
Hemoglobin: 9.6 g/dL — ABNORMAL LOW (ref 13.0–17.0)

## 2019-07-25 LAB — TROPONIN I (HIGH SENSITIVITY): Troponin I (High Sensitivity): 33 ng/L — ABNORMAL HIGH (ref <18)

## 2019-07-25 LAB — BASIC METABOLIC PANEL
Anion gap: 9 (ref 5–15)
Anion gap: 12 (ref 5–15)
BUN: 14 mg/dL (ref 6–20)
BUN: 13 mg/dL (ref 6–20)
CO2: 21 mmol/L — ABNORMAL LOW (ref 22–32)
CO2: 17 mmol/L — ABNORMAL LOW (ref 22–32)
Calcium: 9 mg/dL (ref 8.9–10.3)
Calcium: 8 mg/dL — ABNORMAL LOW (ref 8.9–10.3)
Chloride: 100 mmol/L (ref 98–111)
Chloride: 104 mmol/L (ref 98–111)
Creatinine, Ser: 1.35 mg/dL — ABNORMAL HIGH (ref 0.61–1.24)
Creatinine, Ser: 1.37 mg/dL — ABNORMAL HIGH (ref 0.61–1.24)
GFR calc Af Amer: >60 mL/min (ref >60)
GFR calc Af Amer: >60 mL/min (ref >60)
GFR calc non Af Amer: 57 mL/min — ABNORMAL LOW (ref >60)
GFR calc non Af Amer: 56 mL/min — ABNORMAL LOW (ref >60)
Glucose, Bld: 98 mg/dL (ref 70–99)
Glucose, Bld: 108 mg/dL — ABNORMAL HIGH (ref 70–99)
Potassium: 3.8 mmol/L (ref 3.5–5.1)
Potassium: 4.2 mmol/L (ref 3.5–5.1)
Sodium: 130 mmol/L — ABNORMAL LOW (ref 135–145)
Sodium: 133 mmol/L — ABNORMAL LOW (ref 135–145)

## 2019-07-25 LAB — MAGNESIUM: Magnesium: 2.4 mg/dL (ref 1.7–2.4)

## 2019-07-25 LAB — PROTIME-INR
INR: 1.4 — ABNORMAL HIGH (ref 0.8–1.2)
Prothrombin Time: 17 seconds — ABNORMAL HIGH (ref 11.4–15.2)

## 2019-07-25 LAB — APTT: aPTT: 38 seconds — ABNORMAL HIGH (ref 24–36)

## 2019-07-25 LAB — GLUCOSE, CAPILLARY
Glucose-Capillary: 95 mg/dL (ref 70–99)
Glucose-Capillary: 97 mg/dL (ref 70–99)
Glucose-Capillary: 96 mg/dL (ref 70–99)
Glucose-Capillary: 93 mg/dL (ref 70–99)
Glucose-Capillary: 92 mg/dL (ref 70–99)
Glucose-Capillary: 94 mg/dL (ref 70–99)

## 2019-07-25 LAB — ECHO INTRAOPERATIVE TEE: Weight: 2927.71 oz

## 2019-07-25 LAB — PLATELET COUNT: Platelets: 159 10*3/uL (ref 150–400)

## 2019-07-25 LAB — HEPARIN LEVEL (UNFRACTIONATED): Heparin Unfractionated: 0.1 IU/mL — ABNORMAL LOW (ref 0.30–0.70)

## 2019-07-25 LAB — HEMOGLOBIN A1C
Hgb A1c MFr Bld: 5 % (ref 4.8–5.6)
Mean Plasma Glucose: 97 mg/dL

## 2019-07-25 SURGERY — CORONARY ARTERY BYPASS GRAFTING (CABG)
Anesthesia: General | Site: Chest | Laterality: Right

## 2019-07-25 MED ORDER — DEXTROSE 50 % IV SOLN: 0.0000 mL | INTRAVENOUS | Status: DC | PRN

## 2019-07-25 MED ORDER — ACETAMINOPHEN 650 MG RE SUPP
650.0000 mg | Freq: Once | RECTAL | Status: AC
  Administered 2019-07-25: 650 mg via RECTAL

## 2019-07-25 MED ORDER — MAGNESIUM SULFATE 4 GM/100ML IV SOLN
4.0000 g | Freq: Once | INTRAVENOUS | Status: AC
  Administered 2019-07-25: 4 g via INTRAVENOUS
  Filled 2019-07-25: qty 100

## 2019-07-25 MED ORDER — PHENYLEPHRINE 40 MCG/ML (10ML) SYRINGE FOR IV PUSH (FOR BLOOD PRESSURE SUPPORT)
PREFILLED_SYRINGE | INTRAVENOUS | Status: AC
  Filled 2019-07-25: qty 10

## 2019-07-25 MED ORDER — ASPIRIN EC 325 MG PO TBEC
325.0000 mg | DELAYED_RELEASE_TABLET | Freq: Every day | ORAL | Status: DC
  Administered 2019-07-26 – 2019-07-29 (×4): 325 mg via ORAL
  Filled 2019-07-25 (×4): qty 1

## 2019-07-25 MED ORDER — NITROGLYCERIN IN D5W 200-5 MCG/ML-% IV SOLN
7.0000 ug/min | INTRAVENOUS | Status: AC
  Administered 2019-07-25: 5 ug/min via INTRAVENOUS

## 2019-07-25 MED ORDER — ONDANSETRON HCL 4 MG/2ML IJ SOLN: 4.0000 mg | Freq: Four times a day (QID) | INTRAMUSCULAR | Status: DC | PRN

## 2019-07-25 MED ORDER — MIDAZOLAM HCL 2 MG/2ML IJ SOLN: 2.0000 mg | INTRAMUSCULAR | Status: DC | PRN

## 2019-07-25 MED ORDER — SODIUM CHLORIDE 0.9% FLUSH: 3.0000 mL | INTRAVENOUS | Status: DC | PRN

## 2019-07-25 MED ORDER — MUPIROCIN 2 % EX OINT
1.0000 "application " | TOPICAL_OINTMENT | Freq: Two times a day (BID) | CUTANEOUS | Status: DC
  Administered 2019-07-25 – 2019-07-29 (×9): 1 via NASAL
  Filled 2019-07-25 (×4): qty 22

## 2019-07-25 MED ORDER — ACETAMINOPHEN 500 MG PO TABS
1000.0000 mg | ORAL_TABLET | Freq: Four times a day (QID) | ORAL | Status: DC
  Administered 2019-07-26 – 2019-07-28 (×11): 1000 mg via ORAL
  Filled 2019-07-25 (×12): qty 2

## 2019-07-25 MED ORDER — LACTATED RINGERS IV SOLN
INTRAVENOUS | Status: DC | PRN
Start: 2019-07-25 — End: 2019-07-25

## 2019-07-25 MED ORDER — DOCUSATE SODIUM 100 MG PO CAPS
200.0000 mg | ORAL_CAPSULE | Freq: Every day | ORAL | Status: DC
  Administered 2019-07-26 – 2019-07-28 (×3): 200 mg via ORAL
  Filled 2019-07-25 (×3): qty 2

## 2019-07-25 MED ORDER — PHENYLEPHRINE HCL-NACL 20-0.9 MG/250ML-% IV SOLN
0.0000 ug/min | INTRAVENOUS | Status: DC
  Administered 2019-07-25: 5 ug/min via INTRAVENOUS

## 2019-07-25 MED ORDER — SODIUM CHLORIDE 0.45 % IV SOLN: INTRAVENOUS | Status: DC | PRN

## 2019-07-25 MED ORDER — HEMOSTATIC AGENTS (NO CHARGE) OPTIME
TOPICAL | Status: DC | PRN
  Administered 2019-07-25: 1 via TOPICAL

## 2019-07-25 MED ORDER — SODIUM CHLORIDE 0.9 % IV SOLN: INTRAVENOUS | Status: DC

## 2019-07-25 MED ORDER — HEPARIN SODIUM (PORCINE) 1000 UNIT/ML IJ SOLN
INTRAMUSCULAR | Status: DC | PRN
  Administered 2019-07-25: 24000 [IU] via INTRAVENOUS
  Administered 2019-07-25: 2000 [IU] via INTRAVENOUS

## 2019-07-25 MED ORDER — ALBUMIN HUMAN 5 % IV SOLN
INTRAVENOUS | Status: DC | PRN
Start: 2019-07-25 — End: 2019-07-25

## 2019-07-25 MED ORDER — ARTIFICIAL TEARS OPHTHALMIC OINT
TOPICAL_OINTMENT | OPHTHALMIC | Status: DC | PRN
  Administered 2019-07-25: 1 via OPHTHALMIC

## 2019-07-25 MED ORDER — VANCOMYCIN HCL IN DEXTROSE 1-5 GM/200ML-% IV SOLN
1000.0000 mg | Freq: Once | INTRAVENOUS | Status: AC
  Administered 2019-07-25: 1000 mg via INTRAVENOUS
  Filled 2019-07-25: qty 200

## 2019-07-25 MED ORDER — ACETAMINOPHEN 160 MG/5ML PO SOLN: 650.0000 mg | Freq: Once | ORAL | Status: AC

## 2019-07-25 MED ORDER — METOPROLOL TARTRATE 12.5 MG HALF TABLET
12.5000 mg | ORAL_TABLET | Freq: Two times a day (BID) | ORAL | Status: DC
  Administered 2019-07-26 – 2019-07-29 (×6): 12.5 mg via ORAL
  Filled 2019-07-25 (×7): qty 1

## 2019-07-25 MED ORDER — LACTATED RINGERS IV SOLN: INTRAVENOUS | Status: DC

## 2019-07-25 MED ORDER — FENTANYL CITRATE (PF) 250 MCG/5ML IJ SOLN
INTRAMUSCULAR | Status: AC
  Filled 2019-07-25: qty 25

## 2019-07-25 MED ORDER — LACTATED RINGERS IV SOLN: 500.0000 mL | Freq: Once | INTRAVENOUS | Status: DC | PRN

## 2019-07-25 MED ORDER — 0.9 % SODIUM CHLORIDE (POUR BTL) OPTIME
TOPICAL | Status: DC | PRN
  Administered 2019-07-25: 5000 mL

## 2019-07-25 MED ORDER — INSULIN REGULAR(HUMAN) IN NACL 100-0.9 UT/100ML-% IV SOLN
INTRAVENOUS | Status: DC
  Administered 2019-07-25: 0.5 [IU]/h via INTRAVENOUS

## 2019-07-25 MED ORDER — ACETAMINOPHEN 160 MG/5ML PO SOLN: 1000.0000 mg | Freq: Four times a day (QID) | ORAL | Status: DC

## 2019-07-25 MED ORDER — SODIUM CHLORIDE 0.9% FLUSH
3.0000 mL | Freq: Two times a day (BID) | INTRAVENOUS | Status: DC
  Administered 2019-07-26 (×2): 3 mL via INTRAVENOUS

## 2019-07-25 MED ORDER — LIDOCAINE 2% (20 MG/ML) 5 ML SYRINGE
INTRAMUSCULAR | Status: DC | PRN
  Administered 2019-07-25: 80 mg via INTRAVENOUS

## 2019-07-25 MED ORDER — METOPROLOL TARTRATE 5 MG/5ML IV SOLN: 2.5000 mg | INTRAVENOUS | Status: DC | PRN

## 2019-07-25 MED ORDER — SODIUM CHLORIDE 0.9 % IV SOLN
1.5000 g | Freq: Two times a day (BID) | INTRAVENOUS | Status: DC
  Administered 2019-07-25 – 2019-07-27 (×4): 1.5 g via INTRAVENOUS
  Filled 2019-07-25 (×5): qty 1.5

## 2019-07-25 MED ORDER — ONDANSETRON HCL 4 MG/2ML IJ SOLN
INTRAMUSCULAR | Status: AC
  Filled 2019-07-25: qty 2

## 2019-07-25 MED ORDER — PROTAMINE SULFATE 10 MG/ML IV SOLN
INTRAVENOUS | Status: AC
  Filled 2019-07-25: qty 25

## 2019-07-25 MED ORDER — DEXMEDETOMIDINE HCL IN NACL 400 MCG/100ML IV SOLN
0.0000 ug/kg/h | INTRAVENOUS | Status: DC
  Administered 2019-07-25: 0.7 ug/kg/h via INTRAVENOUS
  Administered 2019-07-25: 0.5 ug/kg/h via INTRAVENOUS
  Filled 2019-07-25: qty 100

## 2019-07-25 MED ORDER — BISACODYL 5 MG PO TBEC
10.0000 mg | DELAYED_RELEASE_TABLET | Freq: Every day | ORAL | Status: DC
  Administered 2019-07-26 – 2019-07-27 (×2): 10 mg via ORAL
  Filled 2019-07-25 (×3): qty 2

## 2019-07-25 MED ORDER — ROCURONIUM BROMIDE 10 MG/ML (PF) SYRINGE
PREFILLED_SYRINGE | INTRAVENOUS | Status: DC | PRN
  Administered 2019-07-25: 50 mg via INTRAVENOUS
  Administered 2019-07-25: 40 mg via INTRAVENOUS
  Administered 2019-07-25: 100 mg via INTRAVENOUS
  Administered 2019-07-25: 50 mg via INTRAVENOUS

## 2019-07-25 MED ORDER — INSULIN ASPART 100 UNIT/ML ~~LOC~~ SOLN: 0.0000 [IU] | SUBCUTANEOUS | Status: DC

## 2019-07-25 MED ORDER — PANTOPRAZOLE SODIUM 40 MG PO TBEC
40.0000 mg | DELAYED_RELEASE_TABLET | Freq: Every day | ORAL | Status: DC
  Administered 2019-07-27 – 2019-07-29 (×3): 40 mg via ORAL
  Filled 2019-07-25 (×3): qty 1

## 2019-07-25 MED ORDER — METOPROLOL TARTRATE 25 MG/10 ML ORAL SUSPENSION
12.5000 mg | Freq: Two times a day (BID) | ORAL | Status: DC
  Filled 2019-07-25 (×5): qty 5

## 2019-07-25 MED ORDER — FAMOTIDINE IN NACL 20-0.9 MG/50ML-% IV SOLN
INTRAVENOUS | Status: AC
  Administered 2019-07-25: 20 mg via INTRAVENOUS
  Filled 2019-07-25: qty 50

## 2019-07-25 MED ORDER — POTASSIUM CHLORIDE 10 MEQ/50ML IV SOLN: 10.0000 meq | INTRAVENOUS | Status: AC

## 2019-07-25 MED ORDER — FAMOTIDINE IN NACL 20-0.9 MG/50ML-% IV SOLN
20.0000 mg | Freq: Two times a day (BID) | INTRAVENOUS | Status: AC
  Administered 2019-07-25: 20 mg via INTRAVENOUS
  Filled 2019-07-25: qty 50

## 2019-07-25 MED ORDER — OXYCODONE HCL 5 MG PO TABS
5.0000 mg | ORAL_TABLET | ORAL | Status: DC | PRN
  Administered 2019-07-26 (×3): 10 mg via ORAL
  Filled 2019-07-25 (×3): qty 2

## 2019-07-25 MED ORDER — CHLORHEXIDINE GLUCONATE CLOTH 2 % EX PADS
6.0000 | MEDICATED_PAD | Freq: Every day | CUTANEOUS | Status: DC
  Administered 2019-07-25 – 2019-07-28 (×4): 6 via TOPICAL

## 2019-07-25 MED ORDER — ROCURONIUM BROMIDE 10 MG/ML (PF) SYRINGE
PREFILLED_SYRINGE | INTRAVENOUS | Status: AC
  Filled 2019-07-25: qty 30

## 2019-07-25 MED ORDER — SODIUM CHLORIDE 0.9 % IV SOLN
250.0000 mL | INTRAVENOUS | Status: DC
  Administered 2019-07-26: 250 mL via INTRAVENOUS

## 2019-07-25 MED ORDER — PROTAMINE SULFATE 10 MG/ML IV SOLN
INTRAVENOUS | Status: DC | PRN
  Administered 2019-07-25: 50 mg via INTRAVENOUS
  Administered 2019-07-25: 40 mg via INTRAVENOUS
  Administered 2019-07-25 (×3): 25 mg via INTRAVENOUS
  Administered 2019-07-25: 50 mg via INTRAVENOUS
  Administered 2019-07-25: 60 mg via INTRAVENOUS

## 2019-07-25 MED ORDER — ALBUMIN HUMAN 5 % IV SOLN
250.0000 mL | INTRAVENOUS | Status: AC | PRN
  Administered 2019-07-25 – 2019-07-26 (×4): 12.5 g via INTRAVENOUS
  Filled 2019-07-25 (×3): qty 250

## 2019-07-25 MED ORDER — MORPHINE SULFATE (PF) 2 MG/ML IV SOLN
1.0000 mg | INTRAVENOUS | Status: DC | PRN
  Administered 2019-07-25: 2 mg via INTRAVENOUS
  Filled 2019-07-25: qty 1

## 2019-07-25 MED ORDER — LACTATED RINGERS IV SOLN: INTRAVENOUS | Status: DC | PRN

## 2019-07-25 MED ORDER — MIDAZOLAM HCL 5 MG/5ML IJ SOLN
INTRAMUSCULAR | Status: DC | PRN
  Administered 2019-07-25: 1 mg via INTRAVENOUS
  Administered 2019-07-25 (×3): 2 mg via INTRAVENOUS
  Administered 2019-07-25: 1 mg via INTRAVENOUS
  Administered 2019-07-25: 2 mg via INTRAVENOUS

## 2019-07-25 MED ORDER — STERILE WATER FOR IRRIGATION IR SOLN
Status: DC | PRN
  Administered 2019-07-25: 1000 mL

## 2019-07-25 MED ORDER — MIDAZOLAM HCL (PF) 10 MG/2ML IJ SOLN
INTRAMUSCULAR | Status: AC
  Filled 2019-07-25: qty 2

## 2019-07-25 MED ORDER — TRAMADOL HCL 50 MG PO TABS
50.0000 mg | ORAL_TABLET | ORAL | Status: DC | PRN
  Administered 2019-07-25 – 2019-07-26 (×4): 50 mg via ORAL
  Filled 2019-07-25 (×4): qty 1

## 2019-07-25 MED ORDER — BISACODYL 10 MG RE SUPP: 10.0000 mg | Freq: Every day | RECTAL | Status: DC

## 2019-07-25 MED ORDER — PHENYLEPHRINE HCL (PRESSORS) 10 MG/ML IV SOLN
INTRAVENOUS | Status: DC | PRN
  Administered 2019-07-25: 120 ug via INTRAVENOUS
  Administered 2019-07-25: 50 ug via INTRAVENOUS
  Administered 2019-07-25: 80 ug via INTRAVENOUS

## 2019-07-25 MED ORDER — HEPARIN SODIUM (PORCINE) 1000 UNIT/ML IJ SOLN
INTRAMUSCULAR | Status: AC
  Filled 2019-07-25: qty 1

## 2019-07-25 MED ORDER — FENTANYL CITRATE (PF) 250 MCG/5ML IJ SOLN
INTRAMUSCULAR | Status: DC | PRN
  Administered 2019-07-25: 250 ug via INTRAVENOUS
  Administered 2019-07-25: 150 ug via INTRAVENOUS
  Administered 2019-07-25 (×2): 100 ug via INTRAVENOUS
  Administered 2019-07-25: 50 ug via INTRAVENOUS
  Administered 2019-07-25 (×4): 100 ug via INTRAVENOUS
  Administered 2019-07-25: 200 ug via INTRAVENOUS

## 2019-07-25 MED ORDER — LIDOCAINE 2% (20 MG/ML) 5 ML SYRINGE
INTRAMUSCULAR | Status: AC
  Filled 2019-07-25: qty 5

## 2019-07-25 MED ORDER — CHLORHEXIDINE GLUCONATE 0.12 % MT SOLN
15.0000 mL | OROMUCOSAL | Status: AC
  Administered 2019-07-25: 15 mL via OROMUCOSAL

## 2019-07-25 MED ORDER — LIDOCAINE 2% (20 MG/ML) 5 ML SYRINGE: INTRAMUSCULAR | Status: DC | PRN

## 2019-07-25 MED ORDER — SODIUM CHLORIDE (PF) 0.9 % IJ SOLN
OROMUCOSAL | Status: DC | PRN
  Administered 2019-07-25 (×3): 4 mL via TOPICAL

## 2019-07-25 MED ORDER — PROPOFOL 10 MG/ML IV BOLUS
INTRAVENOUS | Status: DC | PRN
  Administered 2019-07-25: 100 mg via INTRAVENOUS

## 2019-07-25 MED ORDER — ASPIRIN 81 MG PO CHEW
324.0000 mg | CHEWABLE_TABLET | Freq: Every day | ORAL | Status: DC
  Filled 2019-07-25: qty 4

## 2019-07-25 SURGICAL SUPPLY — 94 items
BAG DECANTER FOR FLEXI CONT (MISCELLANEOUS) ×5 IMPLANT
BLADE CLIPPER SURG (BLADE) ×5 IMPLANT
BLADE STERNUM SYSTEM 6 (BLADE) ×5 IMPLANT
BLADE SURG 10 STRL SS (BLADE) ×2 IMPLANT
BNDG ELASTIC 4X5.8 VLCR STR LF (GAUZE/BANDAGES/DRESSINGS) ×7 IMPLANT
BNDG ELASTIC 6X5.8 VLCR STR LF (GAUZE/BANDAGES/DRESSINGS) ×5 IMPLANT
BNDG GAUZE ELAST 4 BULKY (GAUZE/BANDAGES/DRESSINGS) ×7 IMPLANT
CANISTER SUCT 3000ML PPV (MISCELLANEOUS) ×5 IMPLANT
CANNULA EZ GLIDE AORTIC 21FR (CANNULA) ×5 IMPLANT
CATH CPB KIT HENDRICKSON (MISCELLANEOUS) ×5 IMPLANT
CATH ROBINSON RED A/P 18FR (CATHETERS) ×5 IMPLANT
CATH THORACIC 36FR (CATHETERS) ×5 IMPLANT
CATH THORACIC 36FR RT ANG (CATHETERS) ×5 IMPLANT
CLIP VESOCCLUDE MED 24/CT (CLIP) ×2 IMPLANT
CLIP VESOCCLUDE SM WIDE 24/CT (CLIP) ×6 IMPLANT
COVER SURGICAL LIGHT HANDLE (MISCELLANEOUS) ×2 IMPLANT
DERMABOND ADVANCED (GAUZE/BANDAGES/DRESSINGS) ×2
DERMABOND ADVANCED .7 DNX12 (GAUZE/BANDAGES/DRESSINGS) IMPLANT
DRAPE CARDIOVASCULAR INCISE (DRAPES) ×2
DRAPE SLUSH/WARMER DISC (DRAPES) ×5 IMPLANT
DRAPE SRG 135X102X78XABS (DRAPES) ×3 IMPLANT
DRSG AQUACEL AG ADV 3.5X14 (GAUZE/BANDAGES/DRESSINGS) ×2 IMPLANT
DRSG COVADERM 4X14 (GAUZE/BANDAGES/DRESSINGS) ×5 IMPLANT
ELECT REM PT RETURN 9FT ADLT (ELECTROSURGICAL) ×10
ELECTRODE REM PT RTRN 9FT ADLT (ELECTROSURGICAL) ×6 IMPLANT
FELT TEFLON 1X6 (MISCELLANEOUS) ×8 IMPLANT
GAUZE SPONGE 4X4 12PLY STRL (GAUZE/BANDAGES/DRESSINGS) ×12 IMPLANT
GLOVE BIO SURGEON STRL SZ 6.5 (GLOVE) ×1 IMPLANT
GLOVE BIO SURGEONS STRL SZ 6.5 (GLOVE) ×1
GLOVE BIOGEL PI IND STRL 6 (GLOVE) IMPLANT
GLOVE BIOGEL PI IND STRL 6.5 (GLOVE) IMPLANT
GLOVE BIOGEL PI IND STRL 8.5 (GLOVE) IMPLANT
GLOVE BIOGEL PI INDICATOR 6 (GLOVE) ×8
GLOVE BIOGEL PI INDICATOR 6.5 (GLOVE) ×2
GLOVE BIOGEL PI INDICATOR 8.5 (GLOVE) ×2
GLOVE SURG SIGNA 7.5 PF LTX (GLOVE) ×15 IMPLANT
GOWN STRL REUS W/ TWL LRG LVL3 (GOWN DISPOSABLE) ×12 IMPLANT
GOWN STRL REUS W/ TWL XL LVL3 (GOWN DISPOSABLE) ×6 IMPLANT
GOWN STRL REUS W/TWL LRG LVL3 (GOWN DISPOSABLE) ×12
GOWN STRL REUS W/TWL XL LVL3 (GOWN DISPOSABLE) ×4
HEMOSTAT POWDER SURGIFOAM 1G (HEMOSTASIS) ×15 IMPLANT
HEMOSTAT SURGICEL 2X14 (HEMOSTASIS) ×5 IMPLANT
INSERT FOGARTY XLG (MISCELLANEOUS) IMPLANT
KIT BASIN OR (CUSTOM PROCEDURE TRAY) ×5 IMPLANT
KIT CATH SUCT 8FR (CATHETERS) ×2 IMPLANT
KIT SUCTION CATH 14FR (SUCTIONS) ×10 IMPLANT
KIT TURNOVER KIT B (KITS) ×5 IMPLANT
KIT VASOVIEW HEMOPRO 2 VH 4000 (KITS) ×5 IMPLANT
MARKER GRAFT CORONARY BYPASS (MISCELLANEOUS) ×15 IMPLANT
NS IRRIG 1000ML POUR BTL (IV SOLUTION) ×25 IMPLANT
PACK E OPEN HEART (SUTURE) ×5 IMPLANT
PACK OPEN HEART (CUSTOM PROCEDURE TRAY) ×5 IMPLANT
PAD ARMBOARD 7.5X6 YLW CONV (MISCELLANEOUS) ×10 IMPLANT
PAD ELECT DEFIB RADIOL ZOLL (MISCELLANEOUS) ×5 IMPLANT
PENCIL BUTTON HOLSTER BLD 10FT (ELECTRODE) ×5 IMPLANT
POSITIONER HEAD DONUT 9IN (MISCELLANEOUS) ×5 IMPLANT
PUNCH AORTIC ROTATE 4.0MM (MISCELLANEOUS) ×2 IMPLANT
PUNCH AORTIC ROTATE 4.5MM 8IN (MISCELLANEOUS) IMPLANT
PUNCH AORTIC ROTATE 5MM 8IN (MISCELLANEOUS) IMPLANT
SET CARDIOPLEGIA MPS 5001102 (MISCELLANEOUS) ×2 IMPLANT
SHEARS HARMONIC 9CM CVD (BLADE) ×2 IMPLANT
SPONGE LAP 18X18 RF (DISPOSABLE) ×2 IMPLANT
SPONGE LAP 4X18 RFD (DISPOSABLE) ×2 IMPLANT
SUPPORT HEART JANKE-BARRON (MISCELLANEOUS) ×5 IMPLANT
SUT BONE WAX W31G (SUTURE) ×5 IMPLANT
SUT MNCRL AB 4-0 PS2 18 (SUTURE) IMPLANT
SUT PROLENE 3 0 SH DA (SUTURE) ×5 IMPLANT
SUT PROLENE 4 0 RB 1 (SUTURE) ×2
SUT PROLENE 4 0 SH DA (SUTURE) IMPLANT
SUT PROLENE 4-0 RB1 .5 CRCL 36 (SUTURE) IMPLANT
SUT PROLENE 6 0 C 1 30 (SUTURE) ×16 IMPLANT
SUT PROLENE 7 0 BV1 MDA (SUTURE) ×5 IMPLANT
SUT PROLENE 8 0 BV175 6 (SUTURE) ×6 IMPLANT
SUT STEEL 6MS V (SUTURE) ×5 IMPLANT
SUT STEEL STERNAL CCS#1 18IN (SUTURE) IMPLANT
SUT STEEL SZ 6 DBL 3X14 BALL (SUTURE) ×5 IMPLANT
SUT VIC AB 1 CTX 36 (SUTURE) ×4
SUT VIC AB 1 CTX36XBRD ANBCTR (SUTURE) ×6 IMPLANT
SUT VIC AB 2-0 CT1 27 (SUTURE) ×6
SUT VIC AB 2-0 CT1 TAPERPNT 27 (SUTURE) IMPLANT
SUT VIC AB 2-0 CTX 27 (SUTURE) IMPLANT
SUT VIC AB 3-0 SH 27 (SUTURE)
SUT VIC AB 3-0 SH 27X BRD (SUTURE) IMPLANT
SUT VIC AB 3-0 X1 27 (SUTURE) ×8 IMPLANT
SUT VICRYL 4-0 PS2 18IN ABS (SUTURE) IMPLANT
SYSTEM SAHARA CHEST DRAIN ATS (WOUND CARE) ×5 IMPLANT
TAPE CLOTH SURG 4X10 WHT LF (GAUZE/BANDAGES/DRESSINGS) ×4 IMPLANT
TAPE PAPER 3X10 WHT MICROPORE (GAUZE/BANDAGES/DRESSINGS) ×2 IMPLANT
TOWEL GREEN STERILE (TOWEL DISPOSABLE) ×5 IMPLANT
TOWEL GREEN STERILE FF (TOWEL DISPOSABLE) ×5 IMPLANT
TRAY FOLEY SLVR 16FR TEMP STAT (SET/KITS/TRAYS/PACK) ×5 IMPLANT
TUBING LAP HI FLOW INSUFFLATIO (TUBING) ×5 IMPLANT
UNDERPAD 30X36 HEAVY ABSORB (UNDERPADS AND DIAPERS) ×5 IMPLANT
WATER STERILE IRR 1000ML POUR (IV SOLUTION) ×10 IMPLANT

## 2019-07-25 NOTE — Procedures (Signed)
Extubation Procedure Note  Patient Details:   Name: Tyler Sweeney DOB: 1960/07/05 MRN: [id number]   Airway Documentation:    Vent end date: 07/25/19 Vent end time: 1940   Evaluation  O2 sats: stable throughout Complications: No apparent complications Patient did tolerate procedure well. Bilateral Breath Sounds: Clear   Yes   Pt extubated to 5L [place] per protocol.  NIF: -26 VC: 1.4L VC:544ml.  Positive cuff pressure. No stridor noted. Pt tolerated well. SpO2 100%  Clance Boll 07/25/2019, 7:47 PM

## 2019-07-25 NOTE — Anesthesia Procedure Notes (Signed)
Central Venous Catheter Insertion Performed by: Catalina Gravel, MD, anesthesiologist Start/End5/19/2021 7:40 AM, 07/25/2019 7:50 AM Patient location: Pre-op. Preanesthetic checklist: patient identified, IV checked, site marked, risks and benefits discussed, surgical consent, monitors and equipment checked, pre-op evaluation, timeout performed and anesthesia consent Position: Trendelenburg Lidocaine 1% used for infiltration and patient sedated Hand hygiene performed , maximum sterile barriers used  and Seldinger technique used Catheter size: 9 Fr Central line was placed.MAC introducer Procedure performed using ultrasound guided technique. Ultrasound Notes:anatomy identified, needle tip was noted to be adjacent to the nerve/plexus identified, no ultrasound evidence of intravascular and/or intraneural injection and image(s) printed for medical record Attempts: 1 Following insertion, line sutured, dressing applied and Biopatch. Post procedure assessment: free fluid flow, blood return through all ports and no air  Patient tolerated the procedure well with no immediate complications.

## 2019-07-25 NOTE — Progress Notes (Addendum)
      [address]       [place] [address]             [phone]      Early postop  Hemodynamics stable  Good cardiac index.  Not bleeding  ECG is not significantly different from this mornings. No clinical evidence of ischemia. RCA had minimal disease on cath.  Doing well early postop  CXR showed Swan in distal RLL PA. Swan pulled back 5 cm. Good tracing.  Revonda Standard Roxan Hockey, MD [facility] [phone]

## 2019-07-25 NOTE — Anesthesia Procedure Notes (Signed)
Arterial Line Insertion Start/End5/19/2021 7:50 AM, 07/25/2019 8:00 AM Performed by: Neldon Newport, CRNA, CRNA  Preanesthetic checklist: patient identified, IV checked, site marked, risks and benefits discussed, surgical consent, monitors and equipment checked, pre-op evaluation and timeout performed Lidocaine 1% used for infiltration and patient sedated Right, radial was placed Catheter size: 20 G Hand hygiene performed  and maximum sterile barriers used   Attempts: 1 Procedure performed without using ultrasound guided technique. Following insertion, dressing applied and Biopatch. Post procedure assessment: normal and unchanged  Patient tolerated the procedure well with no immediate complications.

## 2019-07-25 NOTE — Anesthesia Procedure Notes (Signed)
Central Venous Catheter Insertion Performed by: Catalina Gravel, MD, anesthesiologist Start/End5/19/2021 7:50 AM, 07/25/2019 7:55 AM Patient location: Pre-op. Preanesthetic checklist: patient identified, IV checked, site marked, risks and benefits discussed, surgical consent, monitors and equipment checked, pre-op evaluation and timeout performed Position: Trendelenburg Hand hygiene performed  and maximum sterile barriers used  Total catheter length 100. PA cath was placed.Swan type:thermodilution PA Cath depth:60 Procedure performed without using ultrasound guided technique. Attempts: 1 Patient tolerated the procedure well with no immediate complications.

## 2019-07-25 NOTE — Progress Notes (Signed)
[facility] Evening rounds  POD 0 s/p CABG Extubated Not bleeding Hemodynamically stable BP 97/73   Pulse 74   Temp 99.3 F (37.4 C)   Resp 18   Ht 5\' 11"  (1.803 m)   Wt 83 kg   SpO2 100%   BMI 25.52 kg/m     Intake/Output Summary (Last 24 hours) at 07/25/2019 1932 Last data filed at 07/25/2019 1900 Gross per 24 hour  Intake 4487.31 ml  Output 4495 ml  Net -7.69 ml  a/p: continue early post operatve care  Pleshette Tomasini Z. Orvan Seen, [place]

## 2019-07-25 NOTE — Brief Op Note (Signed)
07/24/2019 - 07/25/2019  3:03 PM  PATIENT:  Tyler Sweeney  59 y.o. male  PRE-OPERATIVE DIAGNOSIS:  CAD  POST-OPERATIVE DIAGNOSIS:  CAD  PROCEDURE:  Procedure(s): CORONARY ARTERY BYPASS GRAFTING (CABG) x4 WITH LEFT RADIAL ARTERY HARVEST.  LIMA TO LAD, RADIAL ARTERY TO OM1 AND OM2 SEQUENTIAL, SVG TO DIAGONAL. (N/A) TRANSESOPHAGEAL ECHOCARDIOGRAM (TEE) (N/A) Endovein Harvest Of Greater Saphenous Vein (Right)  SURGEON:  Surgeon(s) and Role:    * Melrose Nakayama, MD - Primary  PHYSICIAN ASSISTANT: WAYNE GOLD PA-C  ASSISTANTS: STAFF   ANESTHESIA:   general  EBL:  750 mL   BLOOD ADMINISTERED:none  DRAINS: LEFT PLEURAL AND PERICARDIAL CHEST TUBES   LOCAL MEDICATIONS USED:  NONE  SPECIMEN:  No Specimen  DISPOSITION OF SPECIMEN:  N/A  COUNTS:  YES  TOURNIQUET:  * No tourniquets in log *  DICTATION: .Other Dictation: Dictation Number PENDING  PLAN OF CARE: Admit to inpatient   PATIENT DISPOSITION:  ICU - intubated and hemodynamically stable.   Delay start of Pharmacological VTE agent (>24hrs) due to surgical blood loss or risk of bleeding: yes

## 2019-07-25 NOTE — Anesthesia Procedure Notes (Signed)
Procedure Name: Intubation Date/Time: 07/25/2019 8:35 AM Performed by: Neldon Newport, CRNA Pre-anesthesia Checklist: Timeout performed, Patient being monitored, Suction available, Emergency Drugs available and Patient identified Patient Re-evaluated:Patient Re-evaluated prior to induction Oxygen Delivery Method: Circle system utilized Preoxygenation: Pre-oxygenation with 100% oxygen Induction Type: IV induction Ventilation: Mask ventilation without difficulty Laryngoscope Size: Mac and 4 Grade View: Grade II Tube type: Oral Tube size: 8.0 mm Number of attempts: 2 Placement Confirmation: positive ETCO2 and breath sounds checked- equal and bilateral Secured at: 24 cm Tube secured with: Tape Dental Injury: Teeth and Oropharynx as per pre-operative assessment

## 2019-07-25 NOTE — Progress Notes (Signed)
[facility] for heparin Indication: chest pain/ACS  Allergies  Allergen Reactions  . Shellfish Allergy Anaphylaxis  . Gluten Meal Other (See Comments)    "can't have", "get inside vibration and get weaker"  . Milk-Related Compounds Other (See Comments)    Stomach issues, "get weaker"  . Ramipril Other (See Comments)    Head spinning and heart racing    Patient Measurements: Weight: 83 kg (182 lb 15.7 oz) Heparin Dosing Weight: TBW  Vital Signs: Temp: 98.7 F (37.1 C) (05/19 0400) Temp Source: Oral (05/19 0400) BP: 122/79 (05/19 0542) Pulse Rate: 71 (05/19 0542)  Labs: Recent Labs    07/24/19 0625 07/24/19 0625 07/24/19 0821 07/24/19 1946 07/24/19 1947 07/25/19 0231  HGB 14.6   < >  --   --  12.9* 13.4  HCT 41.3  --   --   --  38.0* 37.9*  PLT 269  --   --   --   --  247  LABPROT  --   --   --  12.8  --   --   INR  --   --   --  1.0  --   --   HEPARINUNFRC  --   --   --   --   --  0.10*  CREATININE 1.31*  --   --  1.43*  --  1.35*  TROPONINIHS 9   < > 7 30*  --  33*   < > = values in this interval not displayed.    Estimated Creatinine Clearance: 62.8 mL/min (A) (by C-G formula based on SCr of 1.35 mg/dL (H)).   Medical History: Past Medical History:  Diagnosis Date  . Anginal pain ([place])   . Asthma    as a child  . CAD (coronary artery disease) 11/24/2012   11/24/12 Cath nl LM, 99%LAD post D1, 100% 1rst OM with collaterals, 40% circumflex, no RCA dz, s/p DES LAD & CFX/OM1 Dr. Tamala Julian    . Hyperlipidemia 11/24/2012  . MI, old 2014   Diagnosis based on left ventricular dysfunction seen on stress test  . Personal history of other diseases of digestive system   . Polycystic kidney disease    "dx'd but have never had any symptoms" (07/05/2013)   Assessment: 56 YOM presenting with CP, hx of CAD (s/p PCI 2014).  No anticoagulation PTA. He is now s/p cath for CABG consult.   Heparin level low this am, plan for surgery this  morning so will not adjust at this time. No bleeding issues noted overnight. CBC stable.  Goal of Therapy:  Heparin level 0.3-0.7 units/ml Monitor platelets by anticoagulation protocol: Yes   Plan:  -Continue heparin at 950 units/hr until on call for OR -F/up after surgery  Erin Hearing PharmD., BCPS Clinical Pharmacist 07/25/2019 7:27 AM

## 2019-07-25 NOTE — Anesthesia Postprocedure Evaluation (Signed)
Anesthesia Post Note  Patient: Tyler Sweeney  Procedure(s) Performed: CORONARY ARTERY BYPASS GRAFTING (CABG) x4 WITH LEFT RADIAL ARTERY HARVEST.  LIMA TO LAD, RADIAL ARTERY TO OM1 AND OM2 SEQUENTIAL, SVG TO DIAGONAL. (N/A Chest) TRANSESOPHAGEAL ECHOCARDIOGRAM (TEE) (N/A ) Endovein Harvest Of Greater Saphenous Vein (Right )     Patient location during evaluation: SICU Anesthesia Type: General Level of consciousness: sedated Pain management: pain level controlled Vital Signs Assessment: post-procedure vital signs reviewed and stable Respiratory status: patient remains intubated per anesthesia plan Cardiovascular status: stable Postop Assessment: no apparent nausea or vomiting Anesthetic complications: no    Last Vitals:  Vitals:   07/25/19 0542 07/25/19 1434  BP: 122/79 (!) 96/57  Pulse: 71 71  Resp:  12  Temp:    SpO2:  98%    Last Pain:  Vitals:   07/25/19 0400  TempSrc: Oral  PainSc: Avella Edward Kain Milosevic

## 2019-07-25 NOTE — Interval H&P Note (Signed)
History and Physical Interval Note:  07/25/2019 8:17 AM  Tyler Sweeney  has presented today for surgery, with the diagnosis of CAD.  The various methods of treatment have been discussed with the patient and family. After consideration of risks, benefits and other options for treatment, the patient has consented to  Procedure(s): CORONARY ARTERY BYPASS GRAFTING (CABG) WITH LEFT RADIAL ARTERY HARVEST (N/A) TRANSESOPHAGEAL ECHOCARDIOGRAM (TEE) (N/A) as a surgical intervention.  The patient's history has been reviewed, patient examined, no change in status, stable for surgery.  I have reviewed the patient's chart and labs.  Questions were answered to the patient's satisfaction.     Melrose Nakayama

## 2019-07-25 NOTE — Transfer of Care (Signed)
Immediate Anesthesia Transfer of Care Note  Patient: Tyler Sweeney  Procedure(s) Performed: CORONARY ARTERY BYPASS GRAFTING (CABG) x4 WITH LEFT RADIAL ARTERY HARVEST.  LIMA TO LAD, RADIAL ARTERY TO OM1 AND OM2 SEQUENTIAL, SVG TO DIAGONAL. (N/A Chest) TRANSESOPHAGEAL ECHOCARDIOGRAM (TEE) (N/A ) Endovein Harvest Of Greater Saphenous Vein (Right )  Patient Location: SICU  Anesthesia Type:General  Level of Consciousness: Patient remains intubated per anesthesia plan  Airway & Oxygen Therapy: Patient remains intubated per anesthesia plan and Patient placed on Ventilator (see vital sign flow sheet for setting)  Post-op Assessment: Report given to RN  Post vital signs: Reviewed and stable  Last Vitals:  Vitals Value Taken Time  BP    Temp    Pulse 71 07/25/19 1433  Resp 16 07/25/19 1433  SpO2 98 % 07/25/19 1433  Vitals shown include unvalidated device data.  Last Pain:  Vitals:   07/25/19 0400  TempSrc: Oral  PainSc: Asleep         Complications: No apparent anesthesia complications

## 2019-07-26 ENCOUNTER — Inpatient Hospital Stay (HOSPITAL_COMMUNITY): Payer: Commercial Managed Care - PPO

## 2019-07-26 LAB — CBC
HCT: 26 % — ABNORMAL LOW (ref 39.0–52.0)
HCT: 26.7 % — ABNORMAL LOW (ref 39.0–52.0)
HCT: 28.1 % — ABNORMAL LOW (ref 39.0–52.0)
Hemoglobin: 9.1 g/dL — ABNORMAL LOW (ref 13.0–17.0)
Hemoglobin: 9.4 g/dL — ABNORMAL LOW (ref 13.0–17.0)
Hemoglobin: 9.8 g/dL — ABNORMAL LOW (ref 13.0–17.0)
MCH: 32.3 pg (ref 26.0–34.0)
MCH: 32.8 pg (ref 26.0–34.0)
MCH: 32.7 pg (ref 26.0–34.0)
MCHC: 35 g/dL (ref 30.0–36.0)
MCHC: 35.2 g/dL (ref 30.0–36.0)
MCHC: 34.9 g/dL (ref 30.0–36.0)
MCV: 92.2 fL (ref 80.0–100.0)
MCV: 93 fL (ref 80.0–100.0)
MCV: 93.7 fL (ref 80.0–100.0)
Platelets: 137 10*3/uL — ABNORMAL LOW (ref 150–400)
Platelets: 149 10*3/uL — ABNORMAL LOW (ref 150–400)
Platelets: 171 10*3/uL (ref 150–400)
RBC: 2.82 MIL/uL — ABNORMAL LOW (ref 4.22–5.81)
RBC: 2.87 MIL/uL — ABNORMAL LOW (ref 4.22–5.81)
RBC: 3 MIL/uL — ABNORMAL LOW (ref 4.22–5.81)
RDW: 12.5 % (ref 11.5–15.5)
RDW: 12.9 % (ref 11.5–15.5)
RDW: 12.9 % (ref 11.5–15.5)
WBC: 6.9 10*3/uL (ref 4.0–10.5)
WBC: 8.2 10*3/uL (ref 4.0–10.5)
WBC: 10.3 10*3/uL (ref 4.0–10.5)
nRBC: 0 % (ref 0.0–0.2)
nRBC: 0 % (ref 0.0–0.2)
nRBC: 0 % (ref 0.0–0.2)

## 2019-07-26 LAB — GLUCOSE, CAPILLARY
Glucose-Capillary: 100 mg/dL — ABNORMAL HIGH (ref 70–99)
Glucose-Capillary: 102 mg/dL — ABNORMAL HIGH (ref 70–99)
Glucose-Capillary: 107 mg/dL — ABNORMAL HIGH (ref 70–99)
Glucose-Capillary: 118 mg/dL — ABNORMAL HIGH (ref 70–99)
Glucose-Capillary: 120 mg/dL — ABNORMAL HIGH (ref 70–99)
Glucose-Capillary: 128 mg/dL — ABNORMAL HIGH (ref 70–99)
Glucose-Capillary: 145 mg/dL — ABNORMAL HIGH (ref 70–99)
Glucose-Capillary: 96 mg/dL (ref 70–99)

## 2019-07-26 LAB — BASIC METABOLIC PANEL
Anion gap: 10 (ref 5–15)
Anion gap: 10 (ref 5–15)
BUN: 13 mg/dL (ref 6–20)
BUN: 19 mg/dL (ref 6–20)
CO2: 19 mmol/L — ABNORMAL LOW (ref 22–32)
CO2: 20 mmol/L — ABNORMAL LOW (ref 22–32)
Calcium: 8.2 mg/dL — ABNORMAL LOW (ref 8.9–10.3)
Calcium: 8.7 mg/dL — ABNORMAL LOW (ref 8.9–10.3)
Chloride: 104 mmol/L (ref 98–111)
Chloride: 100 mmol/L (ref 98–111)
Creatinine, Ser: 1.52 mg/dL — ABNORMAL HIGH (ref 0.61–1.24)
Creatinine, Ser: 1.97 mg/dL — ABNORMAL HIGH (ref 0.61–1.24)
GFR calc Af Amer: 57 mL/min — ABNORMAL LOW (ref >60)
GFR calc Af Amer: 42 mL/min — ABNORMAL LOW (ref >60)
GFR calc non Af Amer: 49 mL/min — ABNORMAL LOW (ref >60)
GFR calc non Af Amer: 36 mL/min — ABNORMAL LOW (ref >60)
Glucose, Bld: 101 mg/dL — ABNORMAL HIGH (ref 70–99)
Glucose, Bld: 124 mg/dL — ABNORMAL HIGH (ref 70–99)
Potassium: 4.2 mmol/L (ref 3.5–5.1)
Potassium: 4.2 mmol/L (ref 3.5–5.1)
Sodium: 133 mmol/L — ABNORMAL LOW (ref 135–145)
Sodium: 130 mmol/L — ABNORMAL LOW (ref 135–145)

## 2019-07-26 LAB — CREATININE, SERUM
Creatinine, Ser: 1.6 mg/dL — ABNORMAL HIGH (ref 0.61–1.24)
GFR calc Af Amer: 54 mL/min — ABNORMAL LOW (ref >60)
GFR calc non Af Amer: 46 mL/min — ABNORMAL LOW (ref >60)

## 2019-07-26 LAB — MAGNESIUM
Magnesium: 1.9 mg/dL (ref 1.7–2.4)
Magnesium: 2.1 mg/dL (ref 1.7–2.4)

## 2019-07-26 MED ORDER — ISOSORBIDE MONONITRATE ER 30 MG PO TB24
15.0000 mg | ORAL_TABLET | Freq: Every day | ORAL | Status: DC
  Administered 2019-07-26 – 2019-07-29 (×4): 15 mg via ORAL
  Filled 2019-07-26 (×4): qty 1

## 2019-07-26 MED ORDER — SODIUM CHLORIDE 0.9% FLUSH: 10.0000 mL | INTRAVENOUS | Status: DC | PRN

## 2019-07-26 MED ORDER — SODIUM CHLORIDE 0.9% FLUSH
10.0000 mL | Freq: Two times a day (BID) | INTRAVENOUS | Status: DC
  Administered 2019-07-26: 10 mL

## 2019-07-26 MED ORDER — ENOXAPARIN SODIUM 40 MG/0.4ML ~~LOC~~ SOLN
40.0000 mg | Freq: Every day | SUBCUTANEOUS | Status: DC
  Administered 2019-07-26 – 2019-07-28 (×3): 40 mg via SUBCUTANEOUS
  Filled 2019-07-26 (×3): qty 0.4

## 2019-07-26 MED ORDER — INSULIN ASPART 100 UNIT/ML ~~LOC~~ SOLN: 0.0000 [IU] | Freq: Three times a day (TID) | SUBCUTANEOUS | Status: DC

## 2019-07-26 MED ORDER — FUROSEMIDE 10 MG/ML IJ SOLN
20.0000 mg | Freq: Once | INTRAMUSCULAR | Status: AC
  Administered 2019-07-26: 20 mg via INTRAVENOUS
  Filled 2019-07-26: qty 2

## 2019-07-26 MED FILL — Heparin Sodium (Porcine) Inj 1000 Unit/ML: INTRAMUSCULAR | Qty: 30 | Status: AC

## 2019-07-26 MED FILL — Potassium Chloride Inj 2 mEq/ML: INTRAVENOUS | Qty: 40 | Status: AC

## 2019-07-26 MED FILL — Magnesium Sulfate Inj 50%: INTRAMUSCULAR | Qty: 10 | Status: AC

## 2019-07-26 NOTE — Progress Notes (Signed)
EVENING ROUNDS NOTE :     [address]       ,[place] [address]             [phone]                 1 Day Post-Op Procedure(s) (LRB): CORONARY ARTERY BYPASS GRAFTING (CABG) x4 WITH LEFT RADIAL ARTERY HARVEST.  LIMA TO LAD, RADIAL ARTERY TO OM1 AND OM2 SEQUENTIAL, SVG TO DIAGONAL. (N/A) TRANSESOPHAGEAL ECHOCARDIOGRAM (TEE) (N/A) Endovein Harvest Of Greater Saphenous Vein (Right)   Total Length of Stay:  LOS: 2 days  Events:  Doing well Ambulated today Good uop this shift    BP (!) 83/60   Pulse 76   Temp 97.8 F (36.6 C) (Oral)   Resp 12   Ht 5\' 11"  (1.803 m)   Wt 86.7 kg   SpO2 98%   BMI 26.66 kg/m   PAP: (12-29)/(5-22) 19/10 CO:  [3.8 L/min-5.9 L/min] 5.9 L/min CI:  [2.3 L/min/m2-7.7 L/min/m2] 2.9 L/min/m2  Vent Mode: CPAP;PSV FiO2 (%):  [40 %] 40 % Set Rate:  [4 bmp] 4 bmp Vt Set:  [600 mL] 600 mL PEEP:  [5 cmH20] 5 cmH20 Pressure Support:  [10 cmH20] 10 cmH20 Plateau Pressure:  [18 cmH20] 18 cmH20  . sodium chloride Stopped (07/25/19 2020)  . sodium chloride Stopped (07/26/19 1237)  . sodium chloride 10 mL/hr at 07/25/19 1509  . cefUROXime (ZINACEF)  IV Stopped (07/26/19 0843)  . dexmedetomidine (PRECEDEX) IV infusion Stopped (07/25/19 1706)  . lactated ringers    . lactated ringers    . lactated ringers 20 mL/hr at 07/26/19 1500  . phenylephrine (NEO-SYNEPHRINE) Adult infusion Stopped (07/25/19 1803)    I/O last 3 completed shifts: In: 6012.8 [I.V.:2905.8; Blood:455; IV Piggyback:2651.9] Out: [id number]; Blood:750; Chest Tube:660]   CBC Latest Ref Rng & Units 07/26/2019 07/26/2019 07/25/2019  WBC 4.0 - 10.5 K/uL 8.2 6.9 -  Hemoglobin 13.0 - 17.0 g/dL 9.4(L) 9.1(L) 9.2(L)  Hematocrit 39.0 - 52.0 % 26.7(L) 26.0(L) 27.0(L)  Platelets 150 - 400 K/uL 149(L) 137(L) -    BMP Latest Ref Rng & Units 07/26/2019 07/26/2019 07/25/2019  Glucose 70 - 99 mg/dL - 101(H) -  BUN 6 - 20 mg/dL - 13 -  Creatinine 0.61 - 1.24 mg/dL 1.60(H)  1.52(H) -  BUN/Creat Ratio 9 - 20 - - -  Sodium 135 - 145 mmol/L - 133(L) 135  Potassium 3.5 - 5.1 mmol/L - 4.2 4.2  Chloride 98 - 111 mmol/L - 104 -  CO2 22 - 32 mmol/L - 19(L) -  Calcium 8.9 - 10.3 mg/dL - 8.2(L) -    ABG    Component Value Date/Time   PHART 7.303 (L) 07/25/2019 2040   PCO2ART 37.8 07/25/2019 2040   PO2ART 174 (H) 07/25/2019 2040   HCO3 18.6 (L) 07/25/2019 2040   TCO2 20 (L) 07/25/2019 2040   ACIDBASEDEF 7.0 (H) 07/25/2019 2040   O2SAT 99.0 07/25/2019 2040       Melodie Bouillon, MD 07/26/2019 5:36 PM

## 2019-07-26 NOTE — Op Note (Signed)
NAMEARVEY, LADER MEDICAL RECORD [id number] ACCOUNT [id number] DATE OF BIRTH:[birth year] FACILITY: [facility] LOCATION: [facility] PHYSICIAN:Rhesa Forsberg Chaya Jan, MD  OPERATIVE REPORT  DATE OF PROCEDURE:  07/25/2019  PREOPERATIVE DIAGNOSIS:  Severe 2-vessel coronary disease with unstable angina.  POSTOPERATIVE DIAGNOSIS:  Severe 2-vessel coronary disease with unstable angina.  PROCEDURE:  Median sternotomy, extracorporeal circulation, Coronary artery bypass grafting x 4  Left internal mammary artery to left anterior descending,  Sequential left radial artery to OM1 and OM2,  Saphenous vein graft to 1st diagonal Endoscopic vein harvest right thigh.  SURGEON:  Modesto Charon, MD  ASSISTANT:  Jadene Pierini, PA-C   ANESTHESIA:  General.  FINDINGS:  Good quality conduits, good quality targets.  Preserved left ventricular wall motion by TEE.  CLINICAL NOTE:  Mr. Mellish is a 59 year old gentleman with a longstanding history of coronary artery disease who presented with unstable angina.  At catheterization, he was found to have severe 2-vessel disease involving the LAD and circumflex, which  was not amenable to percutaneous intervention.  He was referred for coronary artery bypass grafting.  The indications, risks, benefits, and alternatives were discussed in detail with the patient.  He understood and accepted the risks and agreed to  proceed.  We did discuss use of the radial artery and he had a normal Allen's test both at initial consultation and in the preoperative holding area.  DESCRIPTION OF PROCEDURE:  Mr. Wennberg was brought to the preoperative holding area on 07/25/2019.  Anesthesia placed a Swan-Ganz catheter and an arterial blood pressure monitoring line.  He was taken to the Operating Room, anesthetized and intubated.   Intravenous antibiotics were administered.  Transesophageal echocardiography was performed by Dr. Hoy Morn of anesthesia.  It revealed preserved left ventricular  function.  There was some mild valvular insufficiency, but nothing of clinical  significance.  A Foley catheter was placed.  The chest, abdomen, legs and left arm were prepped and draped in the usual sterile fashion.  An incision was made on the volar aspect of the left wrist.  A short incision was made initially and a short segment of the radial artery was dissected out.  The radial artery was a good quality vessel.  There was good pulse distally and a good Doppler signal in the palmar arch with proximal occlusion.  The incision then was extended to just below the antecubital fossa and the left radial artery was harvested using the Harmonic scalpel.  Heparin 2000 units was administered during the vessel  harvest.  Simultaneously with the radial artery harvest, a median sternotomy was performed and the left internal mammary artery was harvested using standard technique.  The mammary artery was a good quality vessel as was the radial artery.  After the  radial artery was harvested, the arm incision was closed. While that was being done, an incision was made in the medial aspect of the right leg at the level of the knee and the greater saphenous vein was harvested from the distal right thigh endoscopically.  A short segment of  vein was removed for use for the diagonal graft.  After the radial incision was closed, the arm was wrapped and tucked to the patient's side.  A sternal retractor then was placed.  The remainder of the full heparin dose was given.  The pericardium was opened.  After confirming adequate anticoagulation with ACT measurement, the aorta was cannulated via concentric 2-0 Ethibond pledgeted pursestring sutures.  A dual stage venous cannula was placed via a pursestring suture in  the right atrial appendage.   Cardiopulmonary bypass was instituted.  Flows were maintained per protocol.  The patient was cooled to 32 degrees Celsius.  The coronary arteries were inspected and anastomotic sites were  chosen.  The conduits were inspected and cut to length.  A foam  pad was placed in the pericardium to insulate the heart.  A temperature probe was placed in the myocardial septum and a cardioplegia cannula was placed in the ascending aorta.  The aorta was crossclamped.  The left ventricle was emptied via the aortic root vent.  Cardiac arrest then was achieved with a combination of cold antegrade blood cardioplegia and topical iced saline.  750 mL of cardioplegia was administered.  There  was a rapid diastolic arrest and there was septal cooling to 9 degrees Celsius.  The heart was elevated, exposing the lateral wall.  The left radial had been prepared. An arteriotomy was made in OM1.  This was a 2 mm good quality conduit.  A side-to-side anastomosis was performed from the radial to the OM1 with a running 8-0 Prolene  suture.  At completion of the anastomosis, a probe passed easily in all directions prior to tying the suture.  The distal end of the radial artery then was cut to length and bevelled and it was anastomosed end-to-side to OM2.  OM2 was a 1.5 mm good  quality vessel.  Again, a running 8-0 Prolene suture was used for the anastomosis. A probe passed easily proximally and distally.  At the completion of the 2nd anastomosis, the heart was placed back in its normal position.  Cardioplegia was administered down  the aortic root; 500 mL was administered.  There had been significant septal warming while heart was elevated.  The aortic crossclamp appeared to be in good position.  There was rapid septal cooling with cardioplegia administration and there was good  backbleeding from the radial artery.  The heart was then again elevated and the saphenous vein was anastomosed end-to-side to the 1st diagonal.  The 1st diagonal bifurcated and the higher branch was the better quality vessel.  This was intramyocardial.  It was dissected out.  It was a 1.5 mm  good quality target.  The vein was of good quality.   The anastomosis was performed with a running 7-0 Prolene suture.  A probe passed easily proximally and distally.  Cardioplegia was administered down the vein with good flow and good hemostasis.   Additional cardioplegia was also administered down the aortic root.  The left internal mammary artery was brought through a window in the pericardium.  The distal end was bevelled.  It was then anastomosed end-to-side to the distal LAD.  These were both 1.5 mm good quality vessels.  The end-to-side anastomosis was  performed with a running 8-0 Prolene suture.  At completion of the mammary to LAD anastomosis, the bulldog clamp was briefly removed.  Septal rewarming was noted.  The bulldog clamp was replaced and the mammary pedicle was tacked to the epicardial  surface of the heart with 6-0 Prolene sutures.  Additional cardioplegia was administered.  The vein graft and radial grafts were cut to length.  The cardioplegia cannula was removed from the ascending aorta.  The proximal vein graft anastomosis was performed to 4.0 mm punch aortotomy with a running  7-0 Prolene suture.  A venotomy was made in the hood of the vein graft and the proximal anastomosis for the radial was performed end-to-side to the vein with a running 7-0  Prolene suture.  At completion of the proximal anastomosis, the patient was placed  in Trendelenburg position.  Lidocaine was administered.  The bulldog clamp was removed from the left mammary artery.  Septal rewarming was noted.  The patient was placed in Trendelenburg position.  The aortic crossclamp was removed.  The total  crossclamp time was 79 minutes.  The patient spontaneously resumed a bradycardic rhythm initially, and then normal sinus rhythm.  He did not require defibrillation.  While rewarming, all proximal and distal anastomoses were inspected for hemostasis.  Epicardial pacing wires were placed on the right ventricle and right atrium.  When the patient had rewarmed to a core  temperature of 37 degrees Celsius, he was weaned  from cardiopulmonary bypass on the 1st attempt.  He was in sinus rhythm and on no inotropic support at the time of separation from bypass.  Total bypass time was 127 minutes.  The transesophageal echocardiography was unchanged from the preoperative  appearance with preserved left ventricular wall motion.  All grafts were inspected with a Doppler and there was a good Doppler signal in all 3 of the grafts.  A test dose of protamine was administered and was well tolerated.  The atrial and aortic  cannulae were removed.  The remainder of protamine was administered without incident.  The chest was irrigated with warm saline.  Hemostasis was achieved.  The pericardium was reapproximated over the heart with interrupted 3-0 silk sutures that came  together easily without tension.  Left pleural and mediastinal chest tubes were placed through separate subcostal incisions.  The sternum was closed with a combination of single and double heavy gauge stainless steel wires.  Pectoralis fascia,  subcutaneous tissue and skin were closed in standard fashion.  All sponge, needle and instrument counts were correct at the end of the procedure.  The patient was taken from the operating room to the Surgical Intensive Care Unit, intubated and in good  condition.  CN/NUANCE  D:07/25/2019 T:07/26/2019 JOB:[id number]

## 2019-07-26 NOTE — Progress Notes (Signed)
[facility] DAILY ICU PROGRESS NOTE                   [place] [place].[address]            [place] [address]          [phone]   1 Day Post-Op Procedure(s) (LRB): CORONARY ARTERY BYPASS GRAFTING (CABG) x4 WITH LEFT RADIAL ARTERY HARVEST.  LIMA TO LAD, RADIAL ARTERY TO OM1 AND OM2 SEQUENTIAL, SVG TO DIAGONAL. (N/A) TRANSESOPHAGEAL ECHOCARDIOGRAM (TEE) (N/A) Endovein Harvest Of Greater Saphenous Vein (Right)  Total Length of Stay:  LOS: 2 days   Subjective: Doing well overall, mild nausea, mild pain  Objective: Vital signs in last 24 hours: Temp:  [95.7 F (35.4 C)-100.6 F (38.1 C)] 98.8 F (37.1 C) (05/20 0700) Pulse Rate:  [68-103] 89 (05/20 0700) Cardiac Rhythm: Normal sinus rhythm (05/20 0400) Resp:  [12-30] 20 (05/20 0700) BP: (91-133)/(57-100) 91/60 (05/20 0700) SpO2:  [94 %-100 %] 98 % (05/20 0700) Arterial Line BP: (101-161)/(50-80) 114/53 (05/20 0700) FiO2 (%):  [40 %-50 %] 40 % (05/19 1850) Weight:  [86.7 kg] 86.7 kg (05/20 0500)  Filed Weights   07/24/19 0731 07/26/19 0500  Weight: 83 kg 86.7 kg    Weight change: 3.7 kg   Hemodynamic parameters for last 24 hours: PAP: (12-29)/(5-22) 22/15 CO:  [3.8 L/min-5.6 L/min] 3.8 L/min CI:  [2.3 L/min/m2-7.7 L/min/m2] 7.7 L/min/m2  Intake/Output from previous day: 05/19 0701 - 05/20 0700 In: 5873.3 [I.V.:2766.4; Blood:455; IV Piggyback:2651.9] Out: [id number] [Urine:3365; Blood:750; Chest Tube:660]  Intake/Output this shift: No intake/output data recorded.  Current Meds: Scheduled Meds: . acetaminophen  1,000 mg Oral Q6H   Or  . acetaminophen (TYLENOL) oral liquid 160 mg/5 mL  1,000 mg Per Tube Q6H  . aspirin EC  325 mg Oral Daily   Or  . aspirin  324 mg Per Tube Daily  . bisacodyl  10 mg Oral Daily   Or  . bisacodyl  10 mg Rectal Daily  . Chlorhexidine Gluconate Cloth  6 each Topical Daily  . docusate sodium  200 mg Oral Daily  . insulin aspart  0-24 Units Subcutaneous Q4H  . metoprolol tartrate  12.5 mg  Oral BID   Or  . metoprolol tartrate  12.5 mg Per Tube BID  . mupirocin ointment  1 application Nasal BID  . [START ON 07/27/2019] pantoprazole  40 mg Oral Daily  . rosuvastatin  10 mg Oral Daily  . sodium chloride flush  3 mL Intravenous Q12H   Continuous Infusions: . sodium chloride Stopped (07/25/19 2020)  . sodium chloride 20 mL/hr at 07/26/19 0700  . sodium chloride 10 mL/hr at 07/25/19 1509  . cefUROXime (ZINACEF)  IV Stopped (07/25/19 2316)  . dexmedetomidine (PRECEDEX) IV infusion Stopped (07/25/19 1706)  . lactated ringers    . lactated ringers    . lactated ringers Stopped (07/26/19 0543)  . nitroGLYCERIN 7 mcg/min (07/26/19 0700)  . phenylephrine (NEO-SYNEPHRINE) Adult infusion Stopped (07/25/19 1803)   PRN Meds:.sodium chloride, lactated ringers, metoprolol tartrate, midazolam, morphine injection, ondansetron (ZOFRAN) IV, oxyCODONE, sodium chloride flush, traMADol  General appearance: alert, cooperative and no distress Heart: regular rate and rhythm Lungs: mildly dim in bases Abdomen: benign Extremities: no edema Wound: dressings CDI  Lab Results: CBC: Recent Labs    07/25/19 2028 07/25/19 2028 07/25/19 2040 07/26/19 0430  WBC 6.4  --   --  6.9  HGB 10.0*   < > 9.2* 9.1*  HCT 28.6*   < >  27.0* 26.0*  PLT 133*  --   --  137*   < > = values in this interval not displayed.   BMET:  Recent Labs    07/25/19 2028 07/25/19 2028 07/25/19 2040 07/26/19 0430  NA 133*   < > 135 133*  K 4.2   < > 4.2 4.2  CL 104  --   --  104  CO2 17*  --   --  19*  GLUCOSE 108*  --   --  101*  BUN 13  --   --  13  CREATININE 1.37*  --   --  1.52*  CALCIUM 8.0*  --   --  8.2*   < > = values in this interval not displayed.    CMET: Lab Results  Component Value Date   WBC 6.9 07/26/2019   HGB 9.1 (L) 07/26/2019   HCT 26.0 (L) 07/26/2019   PLT 137 (L) 07/26/2019   GLUCOSE 101 (H) 07/26/2019   CHOL 130 07/24/2019   TRIG 73 07/24/2019   HDL 56 07/24/2019   LDLCALC 59  07/24/2019   ALT 18 07/24/2019   AST 23 07/24/2019   NA 133 (L) 07/26/2019   K 4.2 07/26/2019   CL 104 07/26/2019   CREATININE 1.52 (H) 07/26/2019   BUN 13 07/26/2019   CO2 19 (L) 07/26/2019   TSH 1.279 07/24/2019   INR 1.4 (H) 07/25/2019   HGBA1C 5.0 07/24/2019      PT/INR:  Recent Labs    07/25/19 1403  LABPROT 17.0*  INR 1.4*   Radiology: DG Chest Port 1 View  Result Date: 07/25/2019 CLINICAL DATA:  CABG x4, asthma, previous tobacco abuse EXAM: PORTABLE CHEST 1 VIEW COMPARISON:  07/24/2019 FINDINGS: Single frontal view of the chest demonstrates endotracheal tube overlying tracheal air column tip well above carina. There is a flow directed right internal jugular central venous catheter, tip overlying the right lower lobe pulmonary artery. Mediastinal drain and left chest tube identified. Epicardial pacing wires are noted. Median sternotomy wires and clips are seen. The cardiac silhouette is mildly enlarged. Hypoventilatory changes are seen at the lung bases. Mild central vascular congestion. Trace left pleural effusion. No pneumothorax. IMPRESSION: 1. Support devices as above. 2. Low lung volumes with mild central vascular congestion. Electronically Signed   By: Randa Ngo M.D.   On: 07/25/2019 14:54   ECHO INTRAOPERATIVE TEE  Result Date: 07/25/2019  *INTRAOPERATIVE TRANSESOPHAGEAL REPORT *  Patient Name:   Tyler Sweeney  Date of Exam: 07/25/2019 Medical Rec #:  [id number]      Height:       71.0 in Accession #:    [id number]     Weight:       183.0 lb Date of Birth:  [birth year]      BSA:          2.03 m Patient Age:    59 years       BP:           122/79 mmHg Patient Gender: M              HR:           71 bpm. Exam Location:  Anesthesiology Transesophogeal exam was perform intraoperatively during surgical procedure. Patient was closely monitored under general anesthesia during the entirety of examination. Indications:     CAD Sonographer:     Dustin Flock RDCS Performing  Phys: [place] Diagnosing Phys: Hoy Morn MD Complications: No known  complications during this procedure. POST-OP IMPRESSIONS Overall, there were no significant changes from pre-bypass. - Left Ventricle: The left ventricle is unchanged from pre-bypass. has normal systolic function, with an ejection fraction of 60%. - Aorta: The aorta appears unchanged from pre-bypass. - Left Atrial Appendage: The left atrial appendage appears unchanged from pre-bypass. - Aortic Valve: There is mild regurgitation. - Mitral Valve: The mitral valve appears unchanged from pre-bypass. There is mild regurgitation. - Tricuspid Valve: The tricuspid valve appears unchanged from pre-bypass. There is mild regurgitation. - Interatrial Septum: The interatrial septum appears unchanged from pre-bypass. PRE-OP FINDINGS  Left Ventricle: The left ventricle has normal systolic function, with an ejection fraction of 60-65%. The cavity size was normal. There is mild concentric left ventricular hypertrophy. No evidence of left ventricular regional wall motion abnormalities. Right Ventricle: The right ventricle has normal systolic function. The cavity was normal. There is no increase in right ventricular wall thickness. Right ventricular systolic pressure is normal. Left Atrium: Left atrial size was normal in size. The left atrial appendage is well visualized and there is no evidence of thrombus present. Left atrial appendage velocity is normal at greater than 40 cm/s. Right Atrium: Right atrial size was normal in size. Right atrial pressure is estimated at 10 mmHg. Interatrial Septum: No atrial level shunt detected by color flow Doppler. Pericardium: There is no evidence of pericardial effusion. There is pleural effusion in the left lateral region. Mitral Valve: The mitral valve is normal in structure. Mild thickening of the mitral valve leaflet. Mild calcification of the mitral valve leaflet. Mitral valve regurgitation is trivial by  color flow Doppler. There is No evidence of mitral stenosis. Tricuspid Valve: The tricuspid valve was normal in structure. Tricuspid valve regurgitation is trivial by color flow Doppler. Aortic Valve: The aortic valve is normal in structure. Aortic valve regurgitation is trivial by color flow Doppler. There is no evidence of aortic valve stenosis. There is no evidence of a vegetation on the aortic valve. Pulmonic Valve: The pulmonic valve was normal in structure. Pulmonic valve regurgitation is trivial by color flow Doppler. Aorta: The aortic root, ascending aorta and aortic arch are normal in size and structure. Pulmonary Artery: The pulmonary artery is of normal size.  Hoy Morn MD Electronically signed by Hoy Morn MD Signature Date/Time: 07/25/2019/3:06:50 PM    Final      Assessment/Plan: S/P Procedure(s) (LRB): CORONARY ARTERY BYPASS GRAFTING (CABG) x4 WITH LEFT RADIAL ARTERY HARVEST.  LIMA TO LAD, RADIAL ARTERY TO OM1 AND OM2 SEQUENTIAL, SVG TO DIAGONAL. (N/A) TRANSESOPHAGEAL ECHOCARDIOGRAM (TEE) (N/A) Endovein Harvest Of Greater Saphenous Vein (Right)  1 doing well POD # 1, extubated without problem 2 stable hemodynamics on low dose nitro for radial artery, BP a little soft, will convert to Imdur 3 sats good on 2 liters 4 expected ABL anemia, monitor clinically 5 minor reactive thrombocytopenia 6 some ARI, creat 1.52, excellent spont diuresis 7 CXR , some pulm edema, will need some diuresis if urine slow, small left effus 8 BS welll controlled 9 routine line / CT removal- 660cc post op  , hold for now 10 routine pulm toilet/rehab modalities  John Giovanni PA-C Pager [phone] 07/26/2019 7:19 AM

## 2019-07-26 NOTE — Progress Notes (Addendum)
   The patient offered a prayer of blessings, gratitude, and guidance for his healthcare team, which was well received.  Received a technically great operation with predominance of arterial grafting by Dr. Roxan Hockey.  No atrial fibrillation.  ECG this morning is nonischemic.  Doing well thus far.

## 2019-07-26 NOTE — Discharge Summary (Signed)
Physician Discharge Summary  Patient ID: Tyler Sweeney MRN: [id number] DOB/AGE: 09/25/1960 59 y.o.  Admit date: 07/24/2019 Discharge date: 07/29/2019  Admission Diagnoses: Unstable angina  Discharge Diagnoses:  Principal Problem:   Unstable angina [facility]) Active Problems:   Hyperlipidemia   CKD (chronic kidney disease) stage 2, GFR 60-89 ml/min   S/P CABG x 4   Patient Active Problem List   Diagnosis Date Noted  . 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 CKD (chronic kidney disease) stage 2, GFR 60-89 ml/min 02/13/2018  . S/P appendectomy 09/19/2016  . Chest pain 07/05/2013  . Unstable angina ([place]) 07/05/2013  . Vertigo   . CAD (coronary artery disease) 11/24/2012  . Hyperlipidemia 11/24/2012  . GERD (gastroesophageal reflux disease) 05/08/2010    History of Present Illness:    Patient is a 59 year old male we are asked to see in cardiothoracic surgical consultation for consideration of CABG.  Patient presented to the emergency room on 07/24/2019 with complaints of chest pain.  The patient has a history of previous LAD stent in 2014 in the setting of a complex culotte circumflex OM1 procedure.  In 2015 he had angioplasty of restenosis of the circumflex beyond the stent site.  More recently the patient has begun to notice angina with exertion.  On today's date it awoke him from sleep in a presented to the emergency department.  He did have subtle ST abnormalities on EKG.  Due to his previous history was felt by cardiology that he should undergo repeat cardiac catheterization which was done this afternoon.  Findings are as described below.  Findings included severe ostial LAD disease stenosis.  This arises just beyond the ostial circumflex stent and appears to extend into the left main.  He has a patent mid LAD stent with minimal stenosis.  The moderate caliber diagonal branch that arises from the mid LAD stented segment has moderately severe ostial stenosis.  The ostial/proximal circumflex stented  segment is patent with moderate stent restenosis.  The stented segment of the ostial OM1 branch is patent with moderately severe stenosis.  The mid circumflex has a severe stenosis just beyond the takeoff of OM two.  He has mild segmental LV systolic dysfunction and no significant RCA disease.  Due to the progression and severity of the findings we are asked to see in consideration for CABG as his best revascularization option at this point.   Discharged Condition: stable  Hospital Course: The patient was seen in cardiothoracic surgical consultation by Dr. Roxan Hockey who evaluated the patient and studies and recommended to proceed with CABG as his best revascularization option due to the severity of the anatomical findings and accelerating nature of his symptoms.  All risks and benefits were discussed with the patient and he agreed.  On 07/25/2019 he was taken to the operating room where he underwent the below described procedure.  He tolerated it well and was taken to the surgical intensive care unit in stable condition.  Postoperative hospital course:  The patient has done well.  He was extubated without difficulty using standard cardiac surgery protocols.  He was initially kept on nitroglycerin but subsequently transitioned to Imdur for the radial artery graft.  This will be continued for 1 month post discharge for its antispasm mechanism.  All routine lines, monitors and drainage devices have been discontinued in the standard fashion.  He does have a mild expected acute blood loss anemia which has stabilized.  Most recent hemoglobin and hematocrit dated 5/23  are 8.7 and 25.  The patient also has an acute renal insufficiency with a bump in his creatinine to a peak of 1.9.  It is returning to its preoperative level and at discharge most recent values are 1.59.  He has had some volume overload but is responding well to diuretics.  All incisions are noted to be healing well without evidence of infection.   Oxygen has been weaned and he maintains good saturations on room air.  He is tolerating gradually increasing activities using standard cardiac rehab modalities.  He has maintained normal sinus rhythm.  At the time of discharge the patient is felt to be quite stable.  Consults: Cardiology  Significant Diagnostic Studies: angiography: Cardiac catheterization, echocardiogram, routine serial labs and chest x-rays.  Treatments: surgery:  OPERATIVE REPORT  DATE OF PROCEDURE:  07/25/2019  PREOPERATIVE DIAGNOSIS:  Severe 2-vessel coronary disease with unstable angina.  POSTOPERATIVE DIAGNOSIS:  Severe 2-vessel coronary disease with unstable angina.  PROCEDURE:  Median sternotomy, extracorporeal circulation, coronary artery bypass grafting x4 (left internal mammary artery to left anterior descending, sequential left radial artery to OM1 and OM2, saphenous vein graft to 1st diagonal),  endoscopic vein  harvest, right thigh.  SURGEON:  Modesto Charon, MD  ASSISTANT:  Jadene Pierini, PA-C   ANESTHESIA:  General.     ECHOCARDIOGRAM REPORT       Patient Name:  OCEAN STEENSMA Date of Exam: 07/24/2019  Medical Rec #: [id number]   Height:    71.0 in  Accession #:  [id number]  Weight:    183.0 lb  Date of Birth: 04/05/1960   BSA:     2.030 m  Patient Age:  59 years   BP:      161/90 mmHg  Patient Gender: M       HR:      79 bpm.  Exam Location: Inpatient   Procedure: 2D Echo   Indications:  coronary artery disease    History:    Patient has no prior history of Echocardiogram  examinations.         Chronic kidney disease; Risk Factors:Dyslipidemia.    Sonographer:  Johny Chess  Referring Phys: [place]    1. Left ventricular ejection fraction, by estimation, is 55 to 60%. The  left ventricle has normal function. The left ventricle has no regional  wall motion abnormalities.  There is mild left ventricular hypertrophy.  Left ventricular diastolic parameters  are consistent with Grade I diastolic dysfunction (impaired relaxation).  2. Right ventricular systolic function is normal. The right ventricular  size is normal.  3. Moderate pleural effusion in the left lateral region.  4. The mitral valve is abnormal. Trivial mitral valve regurgitation.  5. The aortic valve is tricuspid. Aortic valve regurgitation is not  visualized.  6. The inferior vena cava is normal in size with greater than 50%  respiratory variability, suggesting right atrial pressure of 3 mmHg.   FINDINGS  Left Ventricle: Left ventricular ejection fraction, by estimation, is 55  to 60%. The left ventricle has normal function. The left ventricle has no  regional wall motion abnormalities. The left ventricular internal cavity  size was normal in size. There is  mild left ventricular hypertrophy. Left ventricular diastolic parameters  are consistent with Grade I diastolic dysfunction (impaired relaxation).  Indeterminate filling pressures.   Right Ventricle: The right ventricular size is normal. No increase in  right ventricular wall thickness. Right ventricular systolic function is  normal.   Left Atrium: Left atrial  size was normal in size.   Right Atrium: Right atrial size was normal in size.   Pericardium: There is no evidence of pericardial effusion.   Mitral Valve: The mitral valve is abnormal. There is mild thickening of  the mitral valve leaflet(s). There is mild calcification of the mitral  valve leaflet(s). Trivial mitral valve regurgitation.   Tricuspid Valve: The tricuspid valve is grossly normal. Tricuspid valve  regurgitation is trivial.   Aortic Valve: The aortic valve is tricuspid. Aortic valve regurgitation is  not visualized.   Pulmonic Valve: The pulmonic valve was normal in structure. Pulmonic valve  regurgitation is not visualized.   Aorta: The aortic root  and ascending aorta are structurally normal, with  no evidence of dilitation.   Venous: The inferior vena cava is normal in size with greater than 50%  respiratory variability, suggesting right atrial pressure of 3 mmHg.   IAS/Shunts: No atrial level shunt detected by color flow Doppler.   Additional Comments: There is a moderate pleural effusion in the left  lateral region.     LEFT VENTRICLE  PLAX 2D  LVIDd:     3.90 cm Diastology  LVIDs:     2.70 cm LV e' lateral:  12.30 cm/s  LV PW:     1.30 cm LV E/e' lateral: 5.1  LV IVS:    1.10 cm LV e' medial:  6.64 cm/s  LVOT diam:   2.40 cm LV E/e' medial: 9.5  LV SV:     90  LV SV Index:  45  LVOT Area:   4.52 cm     RIGHT VENTRICLE       IVC  RV S prime:   17.40 cm/s IVC diam: 1.80 cm  TAPSE (M-mode): 2.4 cm   LEFT ATRIUM       Index    RIGHT ATRIUM      Index  LA diam:    3.50 cm 1.72 cm/m RA Area:   15.30 cm  LA Vol (A2C):  58.4 ml 28.76 ml/m RA Volume:  36.90 ml 18.17 ml/m  LA Vol (A4C):  40.9 ml 20.14 ml/m  LA Biplane Vol: 48.9 ml 24.08 ml/m  AORTIC VALVE  LVOT Vmax:  94.20 cm/s  LVOT Vmean: 59.800 cm/s  LVOT VTI:  0.200 m    AORTA  Ao Root diam: 3.50 cm   MV E velocity: 63.00 cm/s  MV A velocity: 93.60 cm/s SHUNTS  MV E/A ratio: 0.67    Systemic VTI: 0.20 m               Systemic Diam: 2.40 cm   Lyman Bishop MD  Electronically signed by Lyman Bishop MD  Signature Date/Time: 07/24/2019/5:39:17 PM       Prox RCA to Mid RCA lesion is 40% stenosed.  Ost Cx to Prox Cx lesion is 60% stenosed.  1st Mrg lesion is 70% stenosed.  Mid Cx lesion is 80% stenosed.  Ost LAD to Prox LAD lesion is 95% stenosed.  Mid LAD lesion is 20% stenosed.  2nd Diag lesion is 70% stenosed.  There is mild left ventricular systolic dysfunction.  LV end diastolic pressure is normal.  The left ventricular ejection fraction  is 45-50% by visual estimate.  There is no mitral valve regurgitation.   1. Severe ostial LAD stenosis. This arises just beyond the ostial Circumflex stent that seems to extend into the left main.  Patent mid LAD stent with minimal restenosis. The moderate caliber Diagonal branch that arises from  the mid LAD stented segment has moderately severe ostial stenosis.  2. The ostial/proximal Circumflex stented segment is patent with moderate stent restenosis. The stented segment of the ostial OM1 branch is patent with moderately severe stenosis.  The mid Circumflex has a severe stenosis just before the takeoff of OM2.  3. The RCA is a large, dominant vessel with mild mid stenosis.  4. Mild segmental LV systolic dysfunction.   Recommendations: He has progression of his CAD. The ostial LAD disease is complicated by the ostial Circumflex stent that extends back into the left main. The stented segment of the ostial/proximal Circumflex and OM has moderate restenosis. The mid Circumflex has severe de novo disease. The Diagonal is affected by the prior mid LAD stent. His anatomy and disease is not favorable for treatment with PCI as any attempt at stenting of the LAD would potentially compromise flow into the Circumflex system. I have reviewed his cath films with Dr. Tamala Julian. Will consult CT surgery for CABG. Resume IV Heparin 6 hours post sheath pull and continue IV NTG. Continue statin, beta blocker and ASA. Will admit to ICU given mild chest pain.   Recommendations  Antiplatelet/Anticoag He has progression of his CAD. The ostial LAD disease is complicated by the ostial Circumflex stent that extends back into the left main. The stented segment of the ostial/proximal Circumflex and OM has moderate restenosis. The mid Circumflex has severe de novo disease. The Diagonal is affected by the prior mid LAD stent. His anatomy and disease is not favorable for treatment with PCI as any attempt at stenting of the LAD would  potentially compromise flow into the Circumflex system. I have reviewed his cath films with Dr. Tamala Julian. Will consult CT surgery for CABG. Resume IV Heparin 6 hours post sheath pull and continue IV NTG. Continue statin, beta blocker and ASA. Will admit to ICU given mild chest pain.  Surgeon Notes    07/26/2019 11:52 AM Operative Note filed by Melrose Nakayama, MD    07/25/2019 3:03 PM Brief Op Note signed by Melrose Nakayama, MD    07/25/2019 2:26 PM Operative Note - Scan signed by Default, Provider, MD  Indications  Unstable angina ([place]) [I20.0 (ICD-10-CM)]  Procedural Details  Technical Details Indication: 59 yo male with history of CAD with prior stenting of the mid LAD, ostial Circumflex/OM bifurcation in 2014 admitted with unstable angina.   Procedure: The risks, benefits, complications, treatment options, and expected outcomes were discussed with the patient. The patient and/or family concurred with the proposed plan, giving informed consent. The patient was brought to the cath lab after IV hydration was given. The patient was sedated with Versed and Fentanyl. The right wrist was prepped and draped in a sterile fashion. 1% lidocaine was used for local anesthesia. Using the modified Seldinger access technique, a 5 French sheath was placed in the right radial artery. 3 mg Verapamil was given through the sheath. 5000 units IV heparin was given. Standard diagnostic catheters were used to perform selective coronary angiography. A pigtail catheter was used to perform a left ventricular angiogram. The sheath was removed from the right radial artery and a Terumo hemostasis band was applied at the arteriotomy site on the right wrist.    Estimated blood loss <50 mL.   During this procedure medications were administered to achieve and maintain moderate conscious sedation while the patient's heart rate, blood pressure, and oxygen saturation were continuously monitored and I was present  face-to-face 100% of this time.  Medications (Filter: Administrations occurring from 07/24/19 1346 to 07/24/19 1450) (important)  Continuous medications are totaled by the amount administered until 07/24/19 1450.  fentaNYL (SUBLIMAZE) injection (mcg) Total dose:  50 mcg  Date/Time  Rate/Dose/Volume Action  07/24/19 1411  50 mcg Given    midazolam (VERSED) injection (mg) Total dose:  2 mg  Date/Time  Rate/Dose/Volume Action  07/24/19 1411  2 mg Given    lidocaine (PF) (XYLOCAINE) 1 % injection (mL) Total volume:  2 mL  Date/Time  Rate/Dose/Volume Action  07/24/19 1419  2 mL Given    Heparin (Porcine) in NaCl 1000-0.9 UT/500ML-% SOLN (mL) Total volume:  1,000 mL  Date/Time  Rate/Dose/Volume Action  07/24/19 1419  500 mL Given  1419  500 mL Given    Radial Cocktail/Verapamil only (mL) Total volume:  10 mL  Date/Time  Rate/Dose/Volume Action  07/24/19 1420  10 mL Given    heparin sodium (porcine) injection (Units) Total dose:  5,000 Units  Date/Time  Rate/Dose/Volume Action  07/24/19 1421  5,000 Units Given    iohexol (OMNIPAQUE) 350 MG/ML injection (mL) Total volume:  90 mL  Date/Time  Rate/Dose/Volume Action  07/24/19 1444  90 mL Given    heparin ADULT infusion 100 units/mL (25000 units/273mL sodium chloride 0.45%) (Units/hr) Total dose:  Cannot be calculated* Dosing weight:  83  *Administration dose not documented Date/Time  Rate/Dose/Volume Action  07/24/19 1347   Stopped    sodium chloride flush (NS) 0.9 % injection 3 mL (mL) Total dose:  Cannot be calculated*  *Administration dose not documented Date/Time  Rate/Dose/Volume Action  07/24/19 1346  *Not included in total MAR Hold    Sedation Time  Sedation Time Physician-1: 30 minutes 39 seconds  Contrast  Medication Name Total Dose  iohexol (OMNIPAQUE) 350 MG/ML injection 90 mL    Radiation/Fluoro  Fluoro time: 3.6 (min) DAP: 15.6 (Gycm2) Cumulative Air Kerma: [id number] (mGy)   Complications  Complications documented before study signed (07/24/2019 3:02 PM)   LEFT HEART CATH AND CORONARY ANGIOGRAPHY  None Documented by Burnell Blanks, MD 07/24/2019 2:59 PM  Date Found: 07/24/2019  Time Range: Intraprocedure      Coronary Findings  Diagnostic Dominance: Right Left Anterior Descending  Ost LAD to Prox LAD lesion 95% stenosed  Ost LAD to Prox LAD lesion is 95% stenosed.  Mid LAD lesion 20% stenosed  Mid LAD lesion is 20% stenosed. The lesion was previously treated using a drug eluting stent over 2 years ago.  Second Diagonal Branch  Vessel is moderate in size.  2nd Diag lesion 70% stenosed  2nd Diag lesion is 70% stenosed.  Left Circumflex  Vessel is large.  Ost Cx to Prox Cx lesion 60% stenosed  Ost Cx to Prox Cx lesion is 60% stenosed. The lesion was previously treated using a drug eluting stent over 2 years ago.  Mid Cx lesion 80% stenosed  Mid Cx lesion is 80% stenosed.  First Obtuse Marginal Branch  Vessel is large in size.  1st Mrg lesion 70% stenosed  1st Mrg lesion is 70% stenosed. The lesion was previously treated using a drug eluting stent over 2 years ago.  Third Obtuse Marginal Branch  Vessel is moderate in size.  Right Coronary Artery  Vessel is large.  Prox RCA to Mid RCA lesion 40% stenosed  Prox RCA to Mid RCA lesion is 40% stenosed.  Intervention  No interventions have been documented. Wall Motion  Resting  Left Heart  Left Ventricle The left ventricular size is normal. There is mild left ventricular systolic dysfunction. LV end diastolic pressure is normal. The left ventricular ejection fraction is 45-50% by visual estimate. There are LV function abnormalities due to segmental dysfunction. There is no evidence of mitral regurgitation.  Coronary Diagrams  Diagnostic Dominance: Right  Intervention     Discharge Exam: Blood pressure 127/77, pulse 93, temperature 98.5 F (36.9 C),  temperature source Oral, resp. rate 14, height 5\' 11"  (1.803 m), weight 84.5 kg, SpO2 93 %.  Physical Exam: General appearance:alert, cooperative and no distress Neurologic:intact Heart:SR in 80's, few PVC's Lungs:Breath sounds CTA.  Extremities:No peripheral edema. the left arm and right leg incisions are intact and dry. Wound:The sternotomy incision is healing well .   Disposition: Discharge disposition: 01-Home or Self Care       Discharge Instructions    Amb Referral to Cardiac Rehabilitation   Complete by: As directed    Diagnosis: CABG   CABG X ___: 4   After initial evaluation and assessments completed: Virtual Based Care may be provided alone or in conjunction with Phase 2 Cardiac Rehab based on patient barriers.: Yes     Allergies as of 07/29/2019      Reactions   Shellfish Allergy Anaphylaxis   Gluten Meal Other (See Comments)   "can't have", "get inside vibration and get weaker"   Milk-related Compounds Other (See Comments)   Stomach issues, "get weaker"   Ramipril Other (See Comments)   Head spinning and heart racing      Medication List    TAKE these medications   aspirin 81 MG chewable tablet Chew 1 tablet (81 mg total) by mouth daily.   isosorbide mononitrate 30 MG 24 hr tablet Commonly known as: IMDUR Take 0.5 tablets (15 mg total) by mouth daily. Start taking on: Jul 30, 2019   metoprolol succinate 50 MG 24 hr tablet Commonly known as: TOPROL-XL Take 1 tablet (50 mg total) by mouth daily. Take with or immediately following a meal.   nitroGLYCERIN 0.4 MG SL tablet Commonly known as: NITROSTAT Place 1 tablet (0.4 mg total) under the tongue every 5 (five) minutes as needed for chest pain.   oxyCODONE 5 MG immediate release tablet Commonly known as: Oxy IR/ROXICODONE Take 1 tablet (5 mg total) by mouth every 4 (four) hours as needed for up to 5 days for severe pain.   rosuvastatin 10 MG tablet Commonly known as: CRESTOR Take 1 tablet  (10 mg total) by mouth daily.   VITAMIN D3 PO Take 1 tablet by mouth daily.   VITAMIN K PO Take 1 tablet by mouth daily.      Follow-up Information    Richardo Priest, MD Follow up.   Specialty: Cardiology Why: see discharge paperwork for follow up appointment with cardiology Contact information: [place] [address] [phone]        Melrose Nakayama, MD Follow up.   Specialty: Cardiothoracic Surgery Why: Please see discharge paperwork for follow-up appointment with surgeon's office.  On the date you see the surgeon please obtain a chest x-ray at [facility] 1/2-hour prior to appointment.  It is in the same office complex. Contact information: [address] [place] [address] [phone]          The patient has been discharged on:   1.Beta Blocker:  Yes Blue.Reese   ]  No   [   ]                              If No, reason:  2.Ace Inhibitor/ARB: Yes [   ]                                     No  [ x   ]                                     If No, reason: BP too soft  3.Statin:   Yes [ y  ]                  No  [   ]                  If No, reason:  4.Ecasa:  Yes  [ y  ]                  No   [   ]                  If No, reason:  Signed: Antony Odea PA-C 07/29/2019, 5:08 PM   Edited by Jerilynn Mages. Henriette Combs 07/29/19 17:06

## 2019-07-26 NOTE — Progress Notes (Signed)
1 Day Post-Op Procedure(s) (LRB): CORONARY ARTERY BYPASS GRAFTING (CABG) x4 WITH LEFT RADIAL ARTERY HARVEST.  LIMA TO LAD, RADIAL ARTERY TO OM1 AND OM2 SEQUENTIAL, SVG TO DIAGONAL. (N/A) TRANSESOPHAGEAL ECHOCARDIOGRAM (TEE) (N/A) Endovein Harvest Of Greater Saphenous Vein (Right) Subjective: Some incisional pain  Objective: Vital signs in last 24 hours: Temp:  [95.7 F (35.4 C)-100.6 F (38.1 C)] 98.8 F (37.1 C) (05/20 0800) Pulse Rate:  [68-103] 94 (05/20 0800) Cardiac Rhythm: Normal sinus rhythm (05/20 0400) Resp:  [12-30] 22 (05/20 0800) BP: (91-133)/(57-100) 105/73 (05/20 0800) SpO2:  [94 %-100 %] 98 % (05/20 0800) Arterial Line BP: (101-161)/(50-80) 139/59 (05/20 0800) FiO2 (%):  [40 %-50 %] 40 % (05/19 1850) Weight:  [86.7 kg] 86.7 kg (05/20 0500)  Hemodynamic parameters for last 24 hours: PAP: (12-29)/(5-22) 25/16 CO:  [3.8 L/min-5.6 L/min] 3.8 L/min CI:  [2.3 L/min/m2-7.7 L/min/m2] 7.7 L/min/m2  Intake/Output from previous day: 05/19 0701 - 05/20 0700 In: 5873.3 [I.V.:2766.4; Blood:455; IV Piggyback:2651.9] Out: [id number] [Urine:3365; Blood:750; Chest Tube:660] Intake/Output this shift: No intake/output data recorded.  General appearance: alert, cooperative and no distress Neurologic: intact Heart: regular rate and rhythm Lungs: diminished breath sounds bibasilar Abdomen: normal findings: soft, non-tender  Lab Results: Recent Labs    07/25/19 2028 07/25/19 2028 07/25/19 2040 07/26/19 0430  WBC 6.4  --   --  6.9  HGB 10.0*   < > 9.2* 9.1*  HCT 28.6*   < > 27.0* 26.0*  PLT 133*  --   --  137*   < > = values in this interval not displayed.   BMET:  Recent Labs    07/25/19 2028 07/25/19 2028 07/25/19 2040 07/26/19 0430  NA 133*   < > 135 133*  K 4.2   < > 4.2 4.2  CL 104  --   --  104  CO2 17*  --   --  19*  GLUCOSE 108*  --   --  101*  BUN 13  --   --  13  CREATININE 1.37*  --   --  1.52*  CALCIUM 8.0*  --   --  8.2*   < > = values in this interval  not displayed.    PT/INR:  Recent Labs    07/25/19 1403  LABPROT 17.0*  INR 1.4*   ABG    Component Value Date/Time   PHART 7.303 (L) 07/25/2019 2040   HCO3 18.6 (L) 07/25/2019 2040   TCO2 20 (L) 07/25/2019 2040   ACIDBASEDEF 7.0 (H) 07/25/2019 2040   O2SAT 99.0 07/25/2019 2040   CBG (last 3)  Recent Labs    07/26/19 0009 07/26/19 0434 07/26/19 0729  GLUCAP 107* 96 102*    Assessment/Plan: S/P Procedure(s) (LRB): CORONARY ARTERY BYPASS GRAFTING (CABG) x4 WITH LEFT RADIAL ARTERY HARVEST.  LIMA TO LAD, RADIAL ARTERY TO OM1 AND OM2 SEQUENTIAL, SVG TO DIAGONAL. (N/A) TRANSESOPHAGEAL ECHOCARDIOGRAM (TEE) (N/A) Endovein Harvest Of Greater Saphenous Vein (Right) - POD # 1 Doing well  CV- good hemodynamics- dc Swan and A line  In SR  ECG - no ischemic changes RESP_ IS for atelectasis RENAL- creatinine up slightly to 1.5, baseline is 1.35.  Lasix x 1 this AM, keep Foley today ENDO- CBG well controlled- change to AC/HS Anemia secondary to ABL- mild, follow SCD + enoxaparin + ambulation for DVT prophylaxis Dc chest tubes Cardiac rehab   LOS: 2 days    Tyler Sweeney 07/26/2019

## 2019-07-26 NOTE — Discharge Instructions (Signed)
Endoscopic Saphenous Vein Harvesting, Care After This sheet gives you information about how to care for yourself after your procedure. Your health care provider may also give you more specific instructions. If you have problems or questions, contact your health care provider. What can I expect after the procedure? After the procedure, it is common to have:  Pain.  Bruising.  Swelling.  Numbness. Follow these instructions at home: Incision care   Follow instructions from your health care provider about how to take care of your incisions. Make sure you: ? Wash your hands with soap and water before and after you change your bandages (dressings). If soap and water are not available, use hand sanitizer. ? Change your dressings as told by your health care provider. ? Leave stitches (sutures), skin glue, or adhesive strips in place. These skin closures may need to stay in place for 2 weeks or longer. If adhesive strip edges start to loosen and curl up, you may trim the loose edges. Do not remove adhesive strips completely unless your health care provider tells you to do that.  Check your incision areas every day for signs of infection. Check for: ? More redness, swelling, or pain. ? Fluid or blood. ? Warmth. ? Pus or a bad smell. Medicines  Take over-the-counter and prescription medicines only as told by your health care provider.  Ask your health care provider if the medicine prescribed to you requires you to avoid driving or using heavy machinery. General instructions  Raise (elevate) your legs above the level of your heart while you are sitting or lying down.  Avoid crossing your legs.  Avoid sitting for long periods of time. Change positions every 30 minutes.  Do any exercises your health care providers have given you. These may include deep breathing, coughing, and walking exercises.  Do not take baths, swim, or use a hot tub until your health care provider approves. Ask your  health care provider if you may take showers. You may only be allowed to take sponge baths.  Wear compression stockings as told by your health care provider. These stockings help to prevent blood clots and reduce swelling in your legs.  Keep all follow-up visits as told by your health care provider. This is important. Contact a health care provider if:  Medicine does not help your pain.  Your pain gets worse.  You have new leg bruises or your leg bruises get bigger.  Your leg feels numb.  You have more redness, swelling, or pain around your incision.  You have fluid or blood coming from your incision.  Your incision feels warm to the touch.  You have pus or a bad smell coming from your incision.  You have a fever. Get help right away if:  Your pain is severe.  You develop pain, tenderness, warmth, redness, or swelling in any part of your leg.  You have chest pain.  You have trouble breathing. Summary  Raise (elevate) your legs above the level of your heart while you are sitting or lying down.  Wear compression stockings as told by your health care provider.  Make sure you know which symptoms should prompt you to contact your health care provider.  Keep all follow-up visits as told by your health care provider. This information is not intended to replace advice given to you by your health care provider. Make sure you discuss any questions you have with your health care provider. Document Revised: 01/30/2018 Document Reviewed: 01/30/2018 Elsevier Patient Education    2020 [employer] Coronary Artery Bypass Grafting, Care After This sheet gives you information about how to care for yourself after your procedure. Your doctor may also give you more specific instructions. If you have problems or questions, call your doctor. What can I expect after the procedure? After the procedure, it is common to:  Feel sick to your stomach (nauseous).  Not want to eat as much as  normal (lack of appetite).  Have trouble pooping (constipation).  Have weakness and tiredness (fatigue).  Feel sad (depressed) or grouchy (irritable).  Have pain or discomfort around the cuts from surgery (incisions). Follow these instructions at home: Medicines  Take over-the-counter and prescription medicines only as told by your doctor. Do not stop taking medicines or start any new medicines unless your doctor says it is okay.  If you were prescribed an antibiotic medicine, take it as told by your doctor. Do not stop taking the antibiotic even if you start to feel better. Incision care   Follow instructions from your doctor about how to take care of your cuts from surgery. Make sure you: ? Wash your hands with soap and water before and after you change your bandage (dressing). If you cannot use soap and water, use hand sanitizer. ? Change your bandage as told by your doctor. ? Leave stitches (sutures), skin glue, or skin tape (adhesive) strips in place. They may need to stay in place for 2 weeks or longer. If tape strips get loose and curl up, you may trim the loose edges. Do not remove tape strips completely unless your doctor says it is okay.  Make sure the surgery cuts are clean, dry, and protected.  Check your cut areas every day for signs of infection. Check for: ? More redness, swelling, or pain. ? More fluid or blood. ? Warmth. ? Pus or a bad smell.  If cuts were made in your legs: ? Avoid crossing your legs. ? Avoid sitting for long periods of time. Change positions every 30 minutes. ? Raise (elevate) your legs when you are sitting. Bathing  Do not take baths, swim, or use a hot tub until your doctor says it is okay.  You may shower. Pat the surgery cuts dry. Do not rub the cuts to dry.  Eating and drinking   Eat foods that are high in fiber, such as beans, nuts, whole grains, and raw fruits and vegetables. Any meats you eat should be lean cut. Avoid canned,  processed, and fried foods. This can help prevent trouble pooping. This is also a part of a heart-healthy diet.  Drink enough fluid to keep your pee (urine) pale yellow.  Do not drink alcohol until you are fully recovered. Ask your doctor when it is safe to drink alcohol. Activity  Rest and limit your activity as told by your doctor. You may be told to: ? Stop any activity right away if you have chest pain, shortness of breath, irregular heartbeats, or dizziness. Get help right away if you have any of these symptoms. ? Move around often for short periods or take short walks as told by your doctor. Slowly increase your activities. ? Avoid lifting, pushing, or pulling anything that is heavier than 10 lb (4.5 kg) for at least 6 weeks or as told by your doctor.  Do physical therapy or a cardiac rehab (cardiac rehabilitation) program as told by your doctor. ? Physical therapy involves doing exercises to maintain movement and build strength and endurance. ? A cardiac rehab   includes:  Exercise training.  Education.  Counseling.  Do not drive until your doctor says it is okay.  Ask your doctor when you can go back to work.  Ask your doctor when you can be sexually active. General instructions  Do not drive or use heavy machinery while taking prescription pain medicine.  Do not use any products that contain nicotine or tobacco. These include cigarettes, e-cigarettes, and chewing tobacco. If you need help quitting, ask your doctor.  Take 2-3 deep breaths every few hours during the day while you get better. This helps expand your lungs and prevent problems.  If you were given a device called an incentive spirometer, use it several times a day to practice deep breathing. Support your chest with a pillow or your arms when you take deep breaths or cough.  Wear compression stockings as told by your doctor.  Weigh yourself every day. This helps to see if your body is holding  (retaining) fluid that may make your heart and lungs work harder.  Keep all follow-up visits as told by your doctor. This is important. Contact a doctor if:  You have more redness, swelling, or pain around any cut.  You have more fluid or blood coming from any cut.  Any cut feels warm to the touch.  You have pus or a bad smell coming from any cut.  You have a fever.  You have swelling in your ankles or legs.  You have pain in your legs.  You gain 2 lb (0.9 kg) or more a day.  You feel sick to your stomach or you throw up (vomit).  You have watery poop (diarrhea). Get help right away if:  You have chest pain that goes to your jaw or arms.  You are short of breath.  You have a fast or irregular heartbeat.  You notice a "clicking" in your breastbone (sternum) when you move.  You have any signs of a stroke. "BE FAST" is an easy way to remember the main warning signs: ? B - Balance. Signs are dizziness, sudden trouble walking, or loss of balance. ? E - Eyes. Signs are trouble seeing or a change in how you see. ? F - Face. Signs are sudden weakness or loss of feeling of the face, or the face or eyelid drooping on one side. ? A - Arms. Signs are weakness or loss of feeling in an arm. This happens suddenly and usually on one side of the body. ? S - Speech. Signs are sudden trouble speaking, slurred speech, or trouble understanding what people say. ? T - Time. Time to call emergency services. Write down what time symptoms started.  You have other signs of a stroke, such as: ? A sudden, very bad headache with no known cause. ? Feeling sick to your stomach. ? Throwing up. ? Jerky movements you cannot control (seizure). These symptoms may be an emergency. Do not wait to see if the symptoms will go away. Get medical help right away. Call your local emergency services (911 in the [place] Do not drive yourself to the hospital. Summary  After the procedure, it is common to have pain  or discomfort in the cuts from surgery (incisions).  Do not take baths, swim, or use a hot tub until your doctor says it is okay.  Slowly increase your activities. You may need physical therapy or cardiac rehab.  Weigh yourself every day. This helps to see if your body is holding fluid. This information  information is not intended to replace advice given to you by your health care provider. Make sure you discuss any questions you have with your health care provider. Document Revised: 11/01/2017 Document Reviewed: 11/01/2017 Elsevier Patient Education  2020 Elsevier Inc.  

## 2019-07-27 ENCOUNTER — Inpatient Hospital Stay (HOSPITAL_COMMUNITY): Payer: Commercial Managed Care - PPO

## 2019-07-27 LAB — BASIC METABOLIC PANEL
Anion gap: 6 (ref 5–15)
BUN: 17 mg/dL (ref 6–20)
CO2: 24 mmol/L (ref 22–32)
Calcium: 8.2 mg/dL — ABNORMAL LOW (ref 8.9–10.3)
Chloride: 100 mmol/L (ref 98–111)
Creatinine, Ser: 1.58 mg/dL — ABNORMAL HIGH (ref 0.61–1.24)
GFR calc Af Amer: 55 mL/min — ABNORMAL LOW (ref >60)
GFR calc non Af Amer: 47 mL/min — ABNORMAL LOW (ref >60)
Glucose, Bld: 101 mg/dL — ABNORMAL HIGH (ref 70–99)
Potassium: 3.7 mmol/L (ref 3.5–5.1)
Sodium: 130 mmol/L — ABNORMAL LOW (ref 135–145)

## 2019-07-27 LAB — CBC
HCT: 23.9 % — ABNORMAL LOW (ref 39.0–52.0)
Hemoglobin: 8.3 g/dL — ABNORMAL LOW (ref 13.0–17.0)
MCH: 32.2 pg (ref 26.0–34.0)
MCHC: 34.7 g/dL (ref 30.0–36.0)
MCV: 92.6 fL (ref 80.0–100.0)
Platelets: 142 10*3/uL — ABNORMAL LOW (ref 150–400)
RBC: 2.58 MIL/uL — ABNORMAL LOW (ref 4.22–5.81)
RDW: 12.8 % (ref 11.5–15.5)
WBC: 8.8 10*3/uL (ref 4.0–10.5)
nRBC: 0 % (ref 0.0–0.2)

## 2019-07-27 LAB — GLUCOSE, CAPILLARY: Glucose-Capillary: 108 mg/dL — ABNORMAL HIGH (ref 70–99)

## 2019-07-27 MED ORDER — ZOLPIDEM TARTRATE 5 MG PO TABS: 5.0000 mg | ORAL_TABLET | Freq: Every evening | ORAL | Status: DC | PRN

## 2019-07-27 MED ORDER — ALUM & MAG HYDROXIDE-SIMETH 200-200-20 MG/5ML PO SUSP: 15.0000 mL | Freq: Four times a day (QID) | ORAL | Status: DC | PRN

## 2019-07-27 MED ORDER — ~~LOC~~ CARDIAC SURGERY, PATIENT & FAMILY EDUCATION: Freq: Once | Status: AC

## 2019-07-27 MED ORDER — FUROSEMIDE 40 MG PO TABS
40.0000 mg | ORAL_TABLET | Freq: Every day | ORAL | Status: AC
  Administered 2019-07-27 – 2019-07-29 (×3): 40 mg via ORAL
  Filled 2019-07-27 (×3): qty 1

## 2019-07-27 MED ORDER — MAGNESIUM HYDROXIDE 400 MG/5ML PO SUSP: 30.0000 mL | Freq: Every day | ORAL | Status: DC | PRN

## 2019-07-27 MED ORDER — SODIUM CHLORIDE 0.9% FLUSH: 3.0000 mL | INTRAVENOUS | Status: DC | PRN

## 2019-07-27 MED ORDER — SODIUM CHLORIDE 0.9 % IV SOLN: 250.0000 mL | INTRAVENOUS | Status: DC | PRN

## 2019-07-27 MED ORDER — POTASSIUM CHLORIDE CRYS ER 20 MEQ PO TBCR
20.0000 meq | EXTENDED_RELEASE_TABLET | ORAL | Status: AC
  Administered 2019-07-27 (×3): 20 meq via ORAL
  Filled 2019-07-27 (×3): qty 1

## 2019-07-27 MED ORDER — SODIUM CHLORIDE 0.9% FLUSH
3.0000 mL | Freq: Two times a day (BID) | INTRAVENOUS | Status: DC
  Administered 2019-07-27 – 2019-07-29 (×4): 3 mL via INTRAVENOUS

## 2019-07-27 MED ORDER — POTASSIUM CHLORIDE ER 10 MEQ PO TBCR
20.0000 meq | EXTENDED_RELEASE_TABLET | Freq: Every day | ORAL | Status: DC
  Filled 2019-07-27: qty 2

## 2019-07-27 NOTE — Progress Notes (Signed)
2 Days Post-Op Procedure(s) (LRB): CORONARY ARTERY BYPASS GRAFTING (CABG) x4 WITH LEFT RADIAL ARTERY HARVEST.  LIMA TO LAD, RADIAL ARTERY TO OM1 AND OM2 SEQUENTIAL, SVG TO DIAGONAL. (N/A) TRANSESOPHAGEAL ECHOCARDIOGRAM (TEE) (N/A) Endovein Harvest Of Greater Saphenous Vein (Right) Subjective: No complaints this AM, denies pain and nausea  Objective: Vital signs in last 24 hours: Temp:  [97.8 F (36.6 C)-98.9 F (37.2 C)] 98.5 F (36.9 C) (05/21 0400) Pulse Rate:  [76-99] 95 (05/21 0700) Cardiac Rhythm: Normal sinus rhythm (05/21 0400) Resp:  [10-25] 18 (05/21 0700) BP: (77-123)/(59-86) 105/72 (05/21 0700) SpO2:  [91 %-98 %] 91 % (05/21 0700) Arterial Line BP: (85-139)/(42-59) 85/42 (05/20 1245) Weight:  [88.3 kg] 88.3 kg (05/21 0500)  Hemodynamic parameters for last 24 hours: PAP: (19-27)/(10-18) 19/10 CO:  [5.9 L/min] 5.9 L/min CI:  [2.9 L/min/m2] 2.9 L/min/m2  Intake/Output from previous day: 05/20 0701 - 05/21 0700 In: 1654.8 [P.O.:750; I.V.:704.7; IV Piggyback:200.1] Out: 2080 [Urine:2010; Chest Tube:70] Intake/Output this shift: No intake/output data recorded.  General appearance: alert, cooperative and no distress Neurologic: intact Heart: regular rate and rhythm Lungs: diminished breath sounds bibasilar Abdomen: normal findings: soft, non-tender  Lab Results: Recent Labs    07/26/19 1723 07/27/19 0450  WBC 10.3 8.8  HGB 9.8* 8.3*  HCT 28.1* 23.9*  PLT 171 142*   BMET:  Recent Labs    07/26/19 1723 07/27/19 0450  NA 130* 130*  K 4.2 3.7  CL 100 100  CO2 20* 24  GLUCOSE 124* 101*  BUN 19 17  CREATININE 1.97* 1.58*  CALCIUM 8.7* 8.2*    PT/INR:  Recent Labs    07/25/19 1403  LABPROT 17.0*  INR 1.4*   ABG    Component Value Date/Time   PHART 7.303 (L) 07/25/2019 2040   HCO3 18.6 (L) 07/25/2019 2040   TCO2 20 (L) 07/25/2019 2040   ACIDBASEDEF 7.0 (H) 07/25/2019 2040   O2SAT 99.0 07/25/2019 2040   CBG (last 3)  Recent Labs     07/26/19 2033 07/26/19 2348 07/27/19 0701  GLUCAP 145* 118* 108*    Assessment/Plan: S/P Procedure(s) (LRB): CORONARY ARTERY BYPASS GRAFTING (CABG) x4 WITH LEFT RADIAL ARTERY HARVEST.  LIMA TO LAD, RADIAL ARTERY TO OM1 AND OM2 SEQUENTIAL, SVG TO DIAGONAL. (N/A) TRANSESOPHAGEAL ECHOCARDIOGRAM (TEE) (N/A) Endovein Harvest Of Greater Saphenous Vein (Right) - POD # 2 Doing well CV- stable, in SR  ASA, metoprolol, statin, and Imdur RESP- IS for basilar atelectasis RENAL- Creatinine up to 1.97 yesterday evening, back down to 1.58 this AM, baseline is around 1.35. Weight up - will give PO lasix ENDO- CBG well controlled- dc CBG/ SSI GI- tolerating regular diet SCD + enoxaparin Ambulate DC central line and Foley   LOS: 3 days    Tyler Sweeney 07/27/2019

## 2019-07-27 NOTE — Progress Notes (Signed)
Pt received from [place]. VSS. Pt oriented to room and unit. Call light in reach. Will continue to monitor.  Clyde Canterbury, RN

## 2019-07-27 NOTE — Progress Notes (Signed)
Mobility Specialist: Progress Note    07/27/19 1616  Mobility  Activity Refused mobility  Mobility performed by Mobility specialist  $Mobility charge 1 Mobility   Pt refused mobility. Pt states he is feeling too tired. Pt was left in room with nurse and family.   [facility] Rojelio Uhrich Mobility Specialist

## 2019-07-27 NOTE — Progress Notes (Signed)
CARDIAC REHAB PHASE I   PRE:  Rate/Rhythm: 91 SR  BP:  Supine:   Sitting: 109/73  Standing:    SaO2: 94%RA  MODE:  Ambulation: 470 ft   POST:  Rate/Rhythm: 112 ST  BP:  Supine:   Sitting: 117/74  Standing:    SaO2: 92-97%RA 1327-1445 Assisted pt to the bathroom and then he walked 470 ft on RA with hand held asst. Pt had slow steady gait and denied dizziness. To recliner after walk. Tolerated well. Pt stated he might go home over weekend so I walked without walker. Do not think he will need walker for home. Still would give assistance when up due to recent surgery. Education completed with pt, daughter and pt's wife who voiced understanding of ed. Discussed staying in the tube, IS, sternal precautions, wound care, walking for ex, gave heart healthy diet, and CRP 2. Pt attended CRP 2 before after PCI. Referral placed to [place]. Pt may be interested in virtual but not sure.   Graylon Good, RN BSN  07/27/2019 2:40 PM

## 2019-07-28 ENCOUNTER — Inpatient Hospital Stay (HOSPITAL_COMMUNITY): Payer: Commercial Managed Care - PPO

## 2019-07-28 LAB — BASIC METABOLIC PANEL
Anion gap: 9 (ref 5–15)
BUN: 18 mg/dL (ref 6–20)
CO2: 21 mmol/L — ABNORMAL LOW (ref 22–32)
Calcium: 8.7 mg/dL — ABNORMAL LOW (ref 8.9–10.3)
Chloride: 102 mmol/L (ref 98–111)
Creatinine, Ser: 1.61 mg/dL — ABNORMAL HIGH (ref 0.61–1.24)
GFR calc Af Amer: 53 mL/min — ABNORMAL LOW (ref >60)
GFR calc non Af Amer: 46 mL/min — ABNORMAL LOW (ref >60)
Glucose, Bld: 97 mg/dL (ref 70–99)
Potassium: 3.7 mmol/L (ref 3.5–5.1)
Sodium: 132 mmol/L — ABNORMAL LOW (ref 135–145)

## 2019-07-28 LAB — CBC
HCT: 23.3 % — ABNORMAL LOW (ref 39.0–52.0)
Hemoglobin: 8.2 g/dL — ABNORMAL LOW (ref 13.0–17.0)
MCH: 32 pg (ref 26.0–34.0)
MCHC: 35.2 g/dL (ref 30.0–36.0)
MCV: 91 fL (ref 80.0–100.0)
Platelets: 166 10*3/uL (ref 150–400)
RBC: 2.56 MIL/uL — ABNORMAL LOW (ref 4.22–5.81)
RDW: 12.4 % (ref 11.5–15.5)
WBC: 8.1 10*3/uL (ref 4.0–10.5)
nRBC: 0 % (ref 0.0–0.2)

## 2019-07-28 MED ORDER — POTASSIUM CHLORIDE CRYS ER 20 MEQ PO TBCR
20.0000 meq | EXTENDED_RELEASE_TABLET | Freq: Every day | ORAL | Status: AC
  Administered 2019-07-28 – 2019-07-29 (×2): 20 meq via ORAL
  Filled 2019-07-28 (×2): qty 1

## 2019-07-28 NOTE — Progress Notes (Signed)
Phase 1 Cardiac Rehab Note:  Patient napping in bed with family in room. Easily awakens. States recently ambulated around 12:00. Has ambulated in hallway 470 feet today x 2. Patient declines walk at this time. Patient states he will ambulate after nap. Denies additional discharge education reinforcement.   Genevieve Ritzel E. Rollene Rotunda RN, BSN [place]. [facility]  Cardiac and Pulmonary Rehabilitation Phone: [phone] Fax: [phone]

## 2019-07-28 NOTE — Progress Notes (Signed)
      [address]       [place] [address]             [phone]       3 Days Post-Op Procedure(s) (LRB): CORONARY ARTERY BYPASS GRAFTING (CABG) x4 WITH LEFT RADIAL ARTERY HARVEST.  LIMA TO LAD, RADIAL ARTERY TO OM1 AND OM2 SEQUENTIAL, SVG TO DIAGONAL. (N/A) TRANSESOPHAGEAL ECHOCARDIOGRAM (TEE) (N/A) Endovein Harvest Of Greater Saphenous Vein (Right) Subjective: Sitting up in the bedside chair with family members present.  Independent with mobility.  On RA with acceptable O2 sats.  BM x 2 yesterday  Objective: Vital signs in last 24 hours: Temp:  [98.1 F (36.7 C)-99.6 F (37.6 C)] 98.4 F (36.9 C) (05/22 0742) Pulse Rate:  [89-109] 93 (05/22 1005) Cardiac Rhythm: Normal sinus rhythm (05/22 0942) Resp:  [18-20] 19 (05/22 0742) BP: (99-120)/(69-82) 112/74 (05/22 1005) SpO2:  [94 %-99 %] 94 % (05/22 0742) Weight:  [86.7 kg] 86.7 kg (05/22 0305)    Intake/Output from previous day: 05/21 0701 - 05/22 0700 In: 250 [P.O.:250] Out: 300 [Urine:300] Intake/Output this shift: Total I/O In: 250 [P.O.:250] Out: -   General appearance: alert, cooperative and no distress Neurologic: intact Heart: SR, few PVC's Lungs: Breath sounds CTA. CXR shows improved aeration overall. No unexpected changes.  Extremities: No peripheral edema. the left wrist and right leg incisions are intact and dry.  Wound: The sternotomy incision is healing well .  Lab Results: Recent Labs    07/27/19 0450 07/28/19 0302  WBC 8.8 8.1  HGB 8.3* 8.2*  HCT 23.9* 23.3*  PLT 142* 166   BMET:  Recent Labs    07/27/19 0450 07/28/19 0302  NA 130* 132*  K 3.7 3.7  CL 100 102  CO2 24 21*  GLUCOSE 101* 97  BUN 17 18  CREATININE 1.58* 1.61*  CALCIUM 8.2* 8.7*    PT/INR:  Recent Labs    07/25/19 1403  LABPROT 17.0*  INR 1.4*   ABG    Component Value Date/Time   PHART 7.303 (L) 07/25/2019 2040   HCO3 18.6 (L) 07/25/2019 2040   TCO2 20 (L) 07/25/2019 2040   ACIDBASEDEF  7.0 (H) 07/25/2019 2040   O2SAT 99.0 07/25/2019 2040   CBG (last 3)  Recent Labs    07/26/19 2033 07/26/19 2348 07/27/19 0701  GLUCAP 145* 118* 108*    Assessment/Plan: S/P Procedure(s) (LRB): CORONARY ARTERY BYPASS GRAFTING (CABG) x4 WITH LEFT RADIAL ARTERY HARVEST.  LIMA TO LAD, RADIAL ARTERY TO OM1 AND OM2 SEQUENTIAL, SVG TO DIAGONAL. (N/A) TRANSESOPHAGEAL ECHOCARDIOGRAM (TEE) (N/A) Endovein Harvest Of Greater Saphenous Vein (Right)  -POD-3 CABG x 4. Making satisfactory recovery. Good progress with mobility.  Continue ASA, statin, BB.   -Expected acute blood loss anemia- Hct 23 but stable. Monitor.  -S.Tachycardia-Possibly related to anemia. BP too soft now to allow increase in metoprolol. Continue to monitor.  -Chronic renal insufficiency- Stable, diuresing with daily oral Lasix. Will continue for 1 more day. Monitor renal function.   -DVT PPX-Continue enoxaparin.     LOS: 4 days    Antony Odea, [place] [phone] 07/28/2019

## 2019-07-29 LAB — CBC
HCT: 25 % — ABNORMAL LOW (ref 39.0–52.0)
Hemoglobin: 8.7 g/dL — ABNORMAL LOW (ref 13.0–17.0)
MCH: 32 pg (ref 26.0–34.0)
MCHC: 34.8 g/dL (ref 30.0–36.0)
MCV: 91.9 fL (ref 80.0–100.0)
Platelets: 212 10*3/uL (ref 150–400)
RBC: 2.72 MIL/uL — ABNORMAL LOW (ref 4.22–5.81)
RDW: 12.7 % (ref 11.5–15.5)
WBC: 7.1 10*3/uL (ref 4.0–10.5)
nRBC: 0 % (ref 0.0–0.2)

## 2019-07-29 LAB — BASIC METABOLIC PANEL
Anion gap: 7 (ref 5–15)
BUN: 19 mg/dL (ref 6–20)
CO2: 24 mmol/L (ref 22–32)
Calcium: 8.8 mg/dL — ABNORMAL LOW (ref 8.9–10.3)
Chloride: 103 mmol/L (ref 98–111)
Creatinine, Ser: 1.59 mg/dL — ABNORMAL HIGH (ref 0.61–1.24)
GFR calc Af Amer: 54 mL/min — ABNORMAL LOW (ref >60)
GFR calc non Af Amer: 47 mL/min — ABNORMAL LOW (ref >60)
Glucose, Bld: 99 mg/dL (ref 70–99)
Potassium: 3.6 mmol/L (ref 3.5–5.1)
Sodium: 134 mmol/L — ABNORMAL LOW (ref 135–145)

## 2019-07-29 MED ORDER — METOPROLOL TARTRATE 25 MG PO TABS
12.5000 mg | ORAL_TABLET | Freq: Two times a day (BID) | ORAL | 2 refills | Status: DC
Start: 2019-07-29 — End: 2019-09-18

## 2019-07-29 MED ORDER — OXYCODONE HCL 5 MG PO TABS: 5.0000 mg | ORAL_TABLET | ORAL | 0 refills | Status: AC | PRN

## 2019-07-29 MED ORDER — ISOSORBIDE MONONITRATE ER 30 MG PO TB24: 15.0000 mg | ORAL_TABLET | Freq: Every day | ORAL | 1 refills | Status: DC

## 2019-07-29 NOTE — Progress Notes (Signed)
Pt discharging home with family.  All instructions given and reviewed, all questions answered.   °

## 2019-07-29 NOTE — Progress Notes (Signed)
EPWs removed per order.  Pt tolerated well, all tips intact, sites unremarkable.  Pt and family understand one hr of bedrest with frequent VS.  CCMD notified, will monitor closely.

## 2019-07-29 NOTE — Progress Notes (Signed)
4 Days Post-Op Procedure(s) (LRB): CORONARY ARTERY BYPASS GRAFTING (CABG) x4 WITH LEFT RADIAL ARTERY HARVEST.  LIMA TO LAD, RADIAL ARTERY TO OM1 AND OM2 SEQUENTIAL, SVG TO DIAGONAL. (N/A) TRANSESOPHAGEAL ECHOCARDIOGRAM (TEE) (N/A) Endovein Harvest Of Greater Saphenous Vein (Right) Subjective: Up walking around in his room ,visiting with his family. Says he feels good, has no complaints or concerns.   Objective: Vital signs in last 24 hours: Temp:  [98 F (36.7 C)-99.6 F (37.6 C)] 98.7 F (37.1 C) (05/23 0811) Pulse Rate:  [85-100] 99 (05/23 0811) Cardiac Rhythm: Normal sinus rhythm (05/23 0821) Resp:  [15-20] 18 (05/23 0811) BP: (106-118)/(68-77) 118/77 (05/23 0811) SpO2:  [95 %-96 %] 96 % (05/23 0811) Weight:  [84.5 kg] 84.5 kg (05/23 0354)     Intake/Output from previous day: 05/22 0701 - 05/23 0700 In: 610 [P.O.:610] Out: -  Intake/Output this shift: No intake/output data recorded.  Physical Exam: General appearance: alert, cooperative and no distress Neurologic: intact Heart: SR in 80's, few PVC's Lungs: Breath sounds CTA.  Extremities: No peripheral edema. the left arm and right leg incisions are intact and dry.  Wound: The sternotomy incision is healing well .  Lab Results: Recent Labs    07/28/19 0302 07/29/19 0450  WBC 8.1 7.1  HGB 8.2* 8.7*  HCT 23.3* 25.0*  PLT 166 212   BMET:  Recent Labs    07/28/19 0302 07/29/19 0450  NA 132* 134*  K 3.7 3.6  CL 102 103  CO2 21* 24  GLUCOSE 97 99  BUN 18 19  CREATININE 1.61* 1.59*  CALCIUM 8.7* 8.8*    PT/INR: No results for input(s): LABPROT, INR in the last 72 hours. ABG    Component Value Date/Time   PHART 7.303 (L) 07/25/2019 2040   HCO3 18.6 (L) 07/25/2019 2040   TCO2 20 (L) 07/25/2019 2040   ACIDBASEDEF 7.0 (H) 07/25/2019 2040   O2SAT 99.0 07/25/2019 2040   CBG (last 3)  Recent Labs    07/26/19 2033 07/26/19 2348 07/27/19 0701  GLUCAP 145* 118* 108*    Assessment/Plan: S/P  Procedure(s) (LRB): CORONARY ARTERY BYPASS GRAFTING (CABG) x4 WITH LEFT RADIAL ARTERY HARVEST.  LIMA TO LAD, RADIAL ARTERY TO OM1 AND OM2 SEQUENTIAL, SVG TO DIAGONAL. (N/A) TRANSESOPHAGEAL ECHOCARDIOGRAM (TEE) (N/A) Endovein Harvest Of Greater Saphenous Vein (Right)  -POD-4 CABG x 4. Making excellent recovery. He is independent with mobility and maintaining good sats on RA.  Continue ASA, statin, BB.   -Expected acute blood loss anemia- Hct 25 and trending up  -S.Tachycardia-Possibly related to anemia. Resting HR is now in the 80's.  -Chronic renal insufficiency- Stable, diuresing with daily oral Lasix. Creat trending down.   -Volume excess- improving, wt down 2kg from yesterday, now only ~1.5kg above pre-op wt.   -Disposition- remove pacer wires. Plan discharge this afternoon.     LOS: 5 days    Antony Odea , [place] [phone] 07/29/2019

## 2019-07-29 NOTE — Progress Notes (Signed)
Pt ambulated with slow steady gait x 470 feet pt tolerated well will continue to monitor.

## 2019-07-31 ENCOUNTER — Encounter: Payer: Self-pay | Admitting: Thoracic Surgery (Cardiothoracic Vascular Surgery)

## 2019-07-31 MED FILL — Lidocaine HCl Local Soln Prefilled Syringe 100 MG/5ML (2%): INTRAMUSCULAR | Qty: 5 | Status: AC

## 2019-07-31 MED FILL — Sodium Bicarbonate IV Soln 8.4%: INTRAVENOUS | Qty: 50 | Status: AC

## 2019-07-31 MED FILL — Mannitol IV Soln 20%: INTRAVENOUS | Qty: 500 | Status: AC

## 2019-07-31 MED FILL — Heparin Sodium (Porcine) Inj 1000 Unit/ML: INTRAMUSCULAR | Qty: 10 | Status: AC

## 2019-07-31 MED FILL — Electrolyte-R (PH 7.4) Solution: INTRAVENOUS | Qty: 4000 | Status: AC

## 2019-07-31 MED FILL — Sodium Chloride IV Soln 0.9%: INTRAVENOUS | Qty: 2000 | Status: AC

## 2019-08-01 ENCOUNTER — Telehealth (HOSPITAL_COMMUNITY): Payer: Self-pay

## 2019-08-01 NOTE — Telephone Encounter (Signed)
Pt insurance is active and benefits verified through [facility]. Co-pay $0.00, DED $3,000.00/$2,142.90 met, out of pocket $6,000.00/$2,155.61 met, co-insurance 10%. No pre-authorization required. Scott/[employer], 08/01/19 @ 12:18PM, NOB#[id number]  Will contact patient to see if he is interested in the Cardiac Rehab Program. If interested, patient will need to complete follow up appt. Once completed, patient will be contacted for scheduling upon review by the RN Navigator.

## 2019-08-01 NOTE — Telephone Encounter (Signed)
Attempted to call patient in regards to Cardiac Rehab - VM full unable to leave VM

## 2019-08-02 ENCOUNTER — Other Ambulatory Visit: Payer: Self-pay

## 2019-08-02 ENCOUNTER — Ambulatory Visit (INDEPENDENT_AMBULATORY_CARE_PROVIDER_SITE_OTHER): Payer: Self-pay

## 2019-08-02 DIAGNOSIS — Z4802 Encounter for removal of sutures: Secondary | ICD-10-CM

## 2019-08-02 DIAGNOSIS — Z951 Presence of aortocoronary bypass graft: Secondary | ICD-10-CM

## 2019-08-02 NOTE — Progress Notes (Signed)
Removed 2 sutures from chest tube incision sites. No signs of infection and patient tolerated well

## 2019-08-03 ENCOUNTER — Encounter: Payer: Self-pay | Admitting: Thoracic Surgery (Cardiothoracic Vascular Surgery)

## 2019-08-09 ENCOUNTER — Telehealth: Payer: Self-pay

## 2019-08-09 NOTE — Telephone Encounter (Signed)
FMLA form faxed to [phone] [id number]/healthcare statement Leaving beginning on 07/24/19 through  10/22/2019/ original forms mailed to home address.

## 2019-08-14 ENCOUNTER — Telehealth: Payer: Self-pay | Admitting: Cardiology

## 2019-08-14 ENCOUNTER — Encounter: Payer: Self-pay | Admitting: Cardiology

## 2019-08-14 ENCOUNTER — Other Ambulatory Visit: Payer: Self-pay

## 2019-08-14 ENCOUNTER — Ambulatory Visit (INDEPENDENT_AMBULATORY_CARE_PROVIDER_SITE_OTHER): Payer: Commercial Managed Care - PPO | Admitting: Cardiology

## 2019-08-14 VITALS — BP 122/82 | HR 86 | Ht 71.0 in | Wt 180.8 lb

## 2019-08-14 DIAGNOSIS — N183 Chronic kidney disease, stage 3 unspecified: Secondary | ICD-10-CM

## 2019-08-14 DIAGNOSIS — E785 Hyperlipidemia, unspecified: Secondary | ICD-10-CM | POA: Diagnosis not present

## 2019-08-14 DIAGNOSIS — S99922A Unspecified injury of left foot, initial encounter: Secondary | ICD-10-CM

## 2019-08-14 DIAGNOSIS — N289 Disorder of kidney and ureter, unspecified: Secondary | ICD-10-CM | POA: Diagnosis not present

## 2019-08-14 DIAGNOSIS — Z951 Presence of aortocoronary bypass graft: Secondary | ICD-10-CM

## 2019-08-14 DIAGNOSIS — S99922D Unspecified injury of left foot, subsequent encounter: Secondary | ICD-10-CM

## 2019-08-14 HISTORY — DX: Unspecified injury of left foot, initial encounter: S99.922A

## 2019-08-14 MED ORDER — ROSUVASTATIN CALCIUM 10 MG PO TABS: 10.0000 mg | ORAL_TABLET | Freq: Every day | ORAL | 1 refills | Status: DC

## 2019-08-14 NOTE — Patient Instructions (Signed)
Medication Instructions:  Your physician recommends that you continue on your current medications as directed. Please refer to the Current Medication list given to you today.  *If you need a refill on your cardiac medications before your next appointment, please call your pharmacy*   Lab Work: Your physician recommends that you return for lab work today: BMET  If you have labs (blood work) drawn today and your tests are completely normal, you will receive your results only by: Marland Kitchen MyChart Message (if you have MyChart) OR . A paper copy in the mail If you have any lab test that is abnormal or we need to change your treatment, we will call you to review the results.  Follow-Up: At [facility], you and your health needs are our priority.  As part of our continuing mission to provide you with exceptional heart care, we have created designated Provider Care Teams.  These Care Teams include your primary Cardiologist (physician) and Advanced Practice Providers (APPs -  Physician Assistants and Nurse Practitioners) who all work together to provide you with the care you need, when you need it.  We recommend signing up for the patient portal called "MyChart".  Sign up information is provided on this After Visit Summary.  MyChart is used to connect with patients for Virtual Visits (Telemedicine).  Patients are able to view lab/test results, encounter notes, upcoming appointments, etc.  Non-urgent messages can be sent to your provider as well.   To learn more about what you can do with MyChart, go to [email or web address].    Your next appointment:   8 week(s)  The format for your next appointment:   In Person  Provider:   Shirlee More, MD

## 2019-08-14 NOTE — Assessment & Plan Note (Signed)
His LDL was 59 on admission May 2021

## 2019-08-14 NOTE — Telephone Encounter (Signed)
Tyler Sweeney is calling requesting his wife attend his upcoming appointment scheduled for today at 2:15 PM due to her driving him and wanting her to hear everything Lurena Joiner says.

## 2019-08-14 NOTE — Assessment & Plan Note (Signed)
SCr 1.6 at discharge- will re check today

## 2019-08-14 NOTE — Progress Notes (Signed)
Cardiology Office Note:    Date:  08/14/2019   ID:  Tyler Sweeney, DOB [birth year], MRN [id number]  PCP:  Orpah Melter, MD  Cardiologist:  Shirlee More, MD  Electrophysiologist:  None   Referring MD: Orpah Melter, MD   No chief complaint on file.   History of Present Illness:    Tyler Sweeney is a 59 y.o. male with a hx of coronary disease, status post prior PCI to the LAD and OM in 2014 and then another circumflex PCI in 2015.  He presented 07/24/2019 with chest pain.  His troponins drifted positive.  He underwent diagnostic catheterization and subsequent CABG x4 with an LIMA to LAD, left radial artery to OM1 OM 2 sequentially, and SVG to the diagonal on 07/25/2019.  Echocardiogram showed preserved LV function.  Preop carotid Dopplers show 1 to 39% narrowing bilaterally.  Patient's hospital course was uncomplicated.  He seen in the office today for follow-up, he was accompanied by his wife.  He tells me he feels like he is getting stronger every day.  He does have some residual left chest wall numbness.  He is tolerating his current medications.  He denies any unusual shortness of breath or tachycardia.  Past Medical History:  Diagnosis Date  . Anginal pain ([place])   . Asthma    as a child  . CAD (coronary artery disease) 11/24/2012   11/24/12 Cath nl LM, 99%LAD post D1, 100% 1rst OM with collaterals, 40% circumflex, no RCA dz, s/p DES LAD & CFX/OM1 Dr. Tamala Julian    . Hyperlipidemia 11/24/2012  . MI, old 2014   Diagnosis based on left ventricular dysfunction seen on stress test  . Personal history of other diseases of digestive system   . Polycystic kidney disease    "dx'd but have never had any symptoms" (07/05/2013)    Past Surgical History:  Procedure Laterality Date  . COLONOSCOPY  11/02/2011   Procedure: COLONOSCOPY;  Surgeon: Winfield Cunas., MD;  Location: [facility];  Service: Endoscopy;  Laterality: N/A;  . CORONARY ANGIOPLASTY  07/05/2013  . CORONARY ANGIOPLASTY WITH  STENT PLACEMENT  11/24/2012   "3 stents"  . CORONARY ARTERY BYPASS GRAFT N/A 07/25/2019   Procedure: CORONARY ARTERY BYPASS GRAFTING (CABG) x4 WITH LEFT RADIAL ARTERY HARVEST.  LIMA TO LAD, RADIAL ARTERY TO OM1 AND OM2 SEQUENTIAL, SVG TO DIAGONAL.;  Surgeon: Melrose Nakayama, MD;  Location: [facility];  Service: Open Heart Surgery;  Laterality: N/A;  . ENDOVEIN HARVEST OF GREATER SAPHENOUS VEIN Right 07/25/2019   Procedure: [place] Of Greater Saphenous Vein;  Surgeon: Melrose Nakayama, MD;  Location: [place];  Service: Open Heart Surgery;  Laterality: Right;  . FOOT SURGERY Left 1984   "cyst"  . INGUINAL HERNIA REPAIR Right 1962  . LAPAROSCOPIC APPENDECTOMY N/A 09/19/2016   Procedure: APPENDECTOMY LAPAROSCOPIC;  Surgeon: Ralene Ok, MD;  Location: [place];  Service: General;  Laterality: N/A;  . LEFT HEART CATH AND CORONARY ANGIOGRAPHY N/A 07/24/2019   Procedure: LEFT HEART CATH AND CORONARY ANGIOGRAPHY;  Surgeon: Burnell Blanks, MD;  Location: [facility];  Service: Cardiovascular;  Laterality: N/A;  . LEFT HEART CATHETERIZATION WITH CORONARY ANGIOGRAM N/A 11/24/2012   Procedure: LEFT HEART CATHETERIZATION WITH CORONARY ANGIOGRAM;  Surgeon: Jacolyn Reedy, MD;  Location: [facility] CATH LAB;  Service: Cardiovascular;  Laterality: N/A;  . LEFT HEART CATHETERIZATION WITH CORONARY ANGIOGRAM N/A 07/05/2013   Procedure: LEFT HEART CATHETERIZATION WITH CORONARY ANGIOGRAM;  Surgeon: Jacolyn Reedy, MD;  Location: [place] [facility];  Service: Cardiovascular;  Laterality: N/A;  . PERCUTANEOUS CORONARY STENT INTERVENTION (PCI-S)  11/24/2012   Procedure: PERCUTANEOUS CORONARY STENT INTERVENTION (PCI-S);  Surgeon: Jacolyn Reedy, MD;  Location: [facility] CATH LAB;  Service: Cardiovascular;;  . PERCUTANEOUS CORONARY STENT INTERVENTION (PCI-S) Right 07/05/2013   Procedure: PERCUTANEOUS CORONARY STENT INTERVENTION (PCI-S);  Surgeon: Jacolyn Reedy, MD;  Location: [facility] CATH LAB;  Service:  Cardiovascular;  Laterality: Right;  . TEE WITHOUT CARDIOVERSION N/A 07/25/2019   Procedure: TRANSESOPHAGEAL ECHOCARDIOGRAM (TEE);  Surgeon: Melrose Nakayama, MD;  Location: [place];  Service: Open Heart Surgery;  Laterality: N/A;    Current Medications: Current Meds  Medication Sig  . aspirin 81 MG chewable tablet Chew 1 tablet (81 mg total) by mouth daily.  . Cholecalciferol (VITAMIN D3 PO) Take 1 tablet by mouth daily.  . isosorbide mononitrate (IMDUR) 30 MG 24 hr tablet Take 0.5 tablets (15 mg total) by mouth daily.  . metoprolol tartrate (LOPRESSOR) 25 MG tablet Take 0.5 tablets (12.5 mg total) by mouth 2 (two) times daily.  . nitroGLYCERIN (NITROSTAT) 0.4 MG SL tablet Place 1 tablet (0.4 mg total) under the tongue every 5 (five) minutes as needed for chest pain.  . rosuvastatin (CRESTOR) 10 MG tablet Take 1 tablet (10 mg total) by mouth daily.  Marland Kitchen VITAMIN K PO Take 1 tablet by mouth daily.  . [DISCONTINUED] rosuvastatin (CRESTOR) 10 MG tablet Take 1 tablet (10 mg total) by mouth daily.     Allergies:   Shellfish allergy, Gluten meal, Milk-related compounds, and Ramipril   Social History   Socioeconomic History  . Marital status: Married    Spouse name: Not on file  . Number of children: Not on file  . Years of education: Not on file  . Highest education level: Not on file  Occupational History  . 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 Smoking status: Never Smoker  . Smokeless tobacco: Never Used  Substance and Sexual Activity  . Alcohol use: No  . Drug use: No  . Sexual activity: Yes  Other Topics Concern  . Not on file  Social History Narrative  . Not on file   Social Determinants of Health   Financial Resource Strain:   . Difficulty of Paying Living Expenses:   Food Insecurity:   . Worried About Charity fundraiser in the Last Year:   . Arboriculturist in the Last Year:   Transportation Needs:   . Film/video editor  (Medical):   Marland Kitchen Lack of Transportation (Non-Medical):   Physical Activity:   . Days of Exercise per Week:   . Minutes of Exercise per Session:   Stress:   . Feeling of Stress :   Social Connections:   . Frequency of Communication with Friends and Family:   . Frequency of Social Gatherings with Friends and Family:   . Attends Religious Services:   . Active Member of Clubs or Organizations:   . Attends Archivist Meetings:   Marland Kitchen Marital Status:      Family History: The patient's family history includes Coronary artery disease in an other family member; Heart attack (age of onset: 19) in his mother; Heart attack (age of onset: 20) in his father; Stroke (age of onset: 27) in his mother.  ROS:   Please see the history of present illness.  Since he had his CABG he injured his left foot.  He  is using a walking cast.  He tells me this will be reevaluated in a couple weeks to determine whether he needs surgery or not.  All other systems reviewed and are negative.  EKGs/Labs/Other Studies Reviewed:    The following studies were reviewed today: Echo 5 /18/2021- IMPRESSIONS    1. Left ventricular ejection fraction, by estimation, is 55 to 60%. The  left ventricle has normal function. The left ventricle has no regional  wall motion abnormalities. There is mild left ventricular hypertrophy.  Left ventricular diastolic parameters  are consistent with Grade I diastolic dysfunction (impaired relaxation).  2. Right ventricular systolic function is normal. The right ventricular  size is normal.  3. Moderate pleural effusion in the left lateral region.  4. The mitral valve is abnormal. Trivial mitral valve regurgitation.  5. The aortic valve is tricuspid. Aortic valve regurgitation is not  visualized.  6. The inferior vena cava is normal in size with greater than 50%  respiratory variability, suggesting right atrial pressure of 3 mmHg.  EKG:  EKG is ordered today.  The ekg  ordered today demonstrates NSR-86  Recent Labs: 07/24/2019: ALT 18; TSH 1.279 07/26/2019: Magnesium 2.1 07/29/2019: BUN 19; Creatinine, Ser 1.59; Hemoglobin 8.7; Platelets 212; Potassium 3.6; Sodium 134  Recent Lipid Panel    Component Value Date/Time   CHOL 130 07/24/2019 1946   CHOL 146 02/20/2019 1650   TRIG 73 07/24/2019 1946   HDL 56 07/24/2019 1946   HDL 44 02/20/2019 1650   CHOLHDL 2.3 07/24/2019 1946   VLDL 15 07/24/2019 1946   LDLCALC 59 07/24/2019 1946   LDLCALC 61 02/20/2019 1650    Physical Exam:    VS:  BP 122/82   Pulse 86   Ht 5\' 11"  (1.803 m)   Wt 180 lb 12.4 oz (82 kg)   SpO2 99%   BMI 25.21 kg/m     Wt Readings from Last 3 Encounters:  08/14/19 180 lb 12.4 oz (82 kg)  07/29/19 186 lb 3.2 oz (84.5 kg)  02/20/19 193 lb (87.5 kg)     GEN:  Well nourished, well developed in no acute distress HEENT: Normal NECK: No JVD; No carotid bruits LYMPHATICS: No lymphadenopathy CARDIAC: RRR, no murmurs, rubs, gallops RESPIRATORY:  Clear to auscultation without rales, wheezing or rhonchi  ABDOMEN: Soft, non-tender, non-distended MUSCULOSKELETAL:  No edema; No deformity -Lt foot in walking boot SKIN: Warm and dry NEUROLOGIC:  Alert and oriented x 3 PSYCHIATRIC:  Normal affect   ASSESSMENT:    S/P CABG x 4 L-LAD, LRA-OM1-OM2, S-PDA 07/24/2019  Dyslipidemia, goal LDL below 70 His LDL was 59 on admission May 2021  Renal insufficiency SCr 1.6 at discharge- will re check today  Injury of left foot He is in a walking boot- he tells me he may need surgery if he doesn't improve in the next 3 weeks.  PLAN:    Same Rx- f/u with Dr Bettina Gavia and Dr Roxan Hockey as scheduled. Check BMP today.    Medication Adjustments/Labs and Tests Ordered: Current medicines are reviewed at length with the patient today.  Concerns regarding medicines are outlined above.  Orders Placed This Encounter  Procedures  . Basic metabolic panel  . EKG 12-Lead   Meds ordered this  encounter  Medications  . rosuvastatin (CRESTOR) 10 MG tablet    Sig: Take 1 tablet (10 mg total) by mouth daily.    Dispense:  90 tablet    Refill:  1    Patient Instructions  Medication  Instructions:  Your physician recommends that you continue on your current medications as directed. Please refer to the Current Medication list given to you today.  *If you need a refill on your cardiac medications before your next appointment, please call your pharmacy*   Lab Work: Your physician recommends that you return for lab work today: BMET  If you have labs (blood work) drawn today and your tests are completely normal, you will receive your results only by: Marland Kitchen MyChart Message (if you have MyChart) OR . A paper copy in the mail If you have any lab test that is abnormal or we need to change your treatment, we will call you to review the results.  Follow-Up: At [facility], you and your health needs are our priority.  As part of our continuing mission to provide you with exceptional heart care, we have created designated Provider Care Teams.  These Care Teams include your primary Cardiologist (physician) and Advanced Practice Providers (APPs -  Physician Assistants and Nurse Practitioners) who all work together to provide you with the care you need, when you need it.  We recommend signing up for the patient portal called "MyChart".  Sign up information is provided on this After Visit Summary.  MyChart is used to connect with patients for Virtual Visits (Telemedicine).  Patients are able to view lab/test results, encounter notes, upcoming appointments, etc.  Non-urgent messages can be sent to your provider as well.   To learn more about what you can do with MyChart, go to [email or web address].    Your next appointment:   8 week(s)  The format for your next appointment:   In Person  Provider:   Shirlee More, MD      Signed, Kerin Ransom, PA-C  08/14/2019 2:41 PM    [place]

## 2019-08-14 NOTE — Assessment & Plan Note (Signed)
L-LAD, LRA-OM1-OM2, S-PDA 07/24/2019

## 2019-08-14 NOTE — Telephone Encounter (Signed)
Called and spoke with pt, notified that it was fine for his wife to accompany him to his appt today. Pt verbalized understanding with no other questions at this time.

## 2019-08-14 NOTE — Assessment & Plan Note (Signed)
He is in a walking boot- he tells me he may need surgery if he doesn't improve in the next 3 weeks.

## 2019-08-15 LAB — BASIC METABOLIC PANEL
BUN/Creatinine Ratio: 11 (ref 9–20)
BUN: 14 mg/dL (ref 6–24)
CO2: 20 mmol/L (ref 20–29)
Calcium: 8.9 mg/dL (ref 8.7–10.2)
Chloride: 98 mmol/L (ref 96–106)
Creatinine, Ser: 1.29 mg/dL — ABNORMAL HIGH (ref 0.76–1.27)
GFR calc Af Amer: 70 mL/min/{1.73_m2} (ref >59)
GFR calc non Af Amer: 60 mL/min/{1.73_m2} (ref >59)
Glucose: 88 mg/dL (ref 65–99)
Potassium: 4.9 mmol/L (ref 3.5–5.2)
Sodium: 133 mmol/L — ABNORMAL LOW (ref 134–144)

## 2019-08-16 ENCOUNTER — Encounter: Payer: Self-pay | Admitting: Thoracic Surgery (Cardiothoracic Vascular Surgery)

## 2019-08-20 ENCOUNTER — Other Ambulatory Visit: Payer: Self-pay | Admitting: Thoracic Surgery (Cardiothoracic Vascular Surgery)

## 2019-08-20 DIAGNOSIS — Z951 Presence of aortocoronary bypass graft: Secondary | ICD-10-CM

## 2019-08-21 ENCOUNTER — Ambulatory Visit (INDEPENDENT_AMBULATORY_CARE_PROVIDER_SITE_OTHER): Payer: Self-pay | Admitting: Thoracic Surgery (Cardiothoracic Vascular Surgery)

## 2019-08-21 ENCOUNTER — Ambulatory Visit
Admission: RE | Admit: 2019-08-21 | Discharge: 2019-08-21 | Disposition: A | Discharge status: Home / Self Care | Payer: Commercial Managed Care - PPO | Source: Ambulatory Visit | Attending: Thoracic Surgery (Cardiothoracic Vascular Surgery) | Admitting: Thoracic Surgery (Cardiothoracic Vascular Surgery)

## 2019-08-21 ENCOUNTER — Encounter: Payer: Self-pay | Admitting: Thoracic Surgery (Cardiothoracic Vascular Surgery)

## 2019-08-21 ENCOUNTER — Other Ambulatory Visit: Payer: Self-pay

## 2019-08-21 VITALS — BP 135/88 | HR 90 | Temp 98.2°F | Resp 20 | Ht 71.0 in | Wt 185.0 lb

## 2019-08-21 DIAGNOSIS — Z951 Presence of aortocoronary bypass graft: Secondary | ICD-10-CM

## 2019-08-21 NOTE — Progress Notes (Signed)
[address]       [place] [address]             [phone]     HPI: [name] returns for a scheduled follow-up visit  Tyler Sweeney is a 59 year old man with a history of coronary disease, hyperlipidemia, and polycystic kidney disease.  He presented with unstable angina back in May and was found to have severe two-vessel coronary disease.  He underwent coronary bypass grafting x4 with a left mammary and left radial as well as a saphenous vein graft on 07/25/2019.  Postoperatively he did well and went home on day 4.  At home he had a fall and injured his left foot.  He is currently in a boot.  He has some incisional discomfort but is not taking any narcotics.  He particular complains of some hypersensitivity along the left side of the sternum.  He has not had any anginal pain or shortness of breath.  He was having some problems with some orthostatic symptoms a week or so ago and stopped the Imdur.  His symptoms resolved and he is feeling much better since he stopped that medication.  Past Medical History:  Diagnosis Date  . Anginal pain ([place])   . Asthma    as a child  . CAD (coronary artery disease) 11/24/2012   11/24/12 Cath nl LM, 99%LAD post D1, 100% 1rst OM with collaterals, 40% circumflex, no RCA dz, s/p DES LAD & CFX/OM1 Dr. Tamala Julian    . Hyperlipidemia 11/24/2012  . MI, old 2014   Diagnosis based on left ventricular dysfunction seen on stress test  . Personal history of other diseases of digestive system   . Polycystic kidney disease    "dx'd but have never had any symptoms" (07/05/2013)    Current Outpatient Medications  Medication Sig Dispense Refill  . aspirin 81 MG chewable tablet Chew 1 tablet (81 mg total) by mouth daily. 30 tablet 12  . Cholecalciferol (VITAMIN D3 PO) Take 1 tablet by mouth daily.    . metoprolol tartrate (LOPRESSOR) 25 MG tablet Take 0.5 tablets (12.5 mg total) by mouth 2 (two) times daily. 30 tablet 2  . nitroGLYCERIN (NITROSTAT)  0.4 MG SL tablet Place 1 tablet (0.4 mg total) under the tongue every 5 (five) minutes as needed for chest pain. 25 tablet 11  . rosuvastatin (CRESTOR) 10 MG tablet Take 1 tablet (10 mg total) by mouth daily. 90 tablet 1  . VITAMIN K PO Take 1 tablet by mouth daily.     No current facility-administered medications for this visit.    Physical Exam BP 135/88 (BP Location: Right Arm, Patient Position: Sitting, Cuff Size: Normal)   Pulse 90   Temp 98.2 F (36.8 C) (Temporal)   Resp 20   Ht 5\' 11"  (1.803 m)   Wt 185 lb (83.9 kg) Comment: w/ foot boot  SpO2 98% Comment: RA  BMI 25.80 kg/m  Well-appearing 59 year old man in no acute distress Alert and oriented x3 with no focal deficits Lungs diminished at left base but otherwise clear Cardiac regular rate and rhythm no rubs or murmurs Sternum stable, incision healing well Left hand neuro intact, brisk cap refill, incision healing well Leg incision healing well, no peripheral edema  Diagnostic Tests: CHEST - 2 VIEW  COMPARISON:  07/28/2019  FINDINGS: Right base atelectasis and tiny right pleural effusion of resolved in the interval. There is some trace residual atelectasis or scar at the left base  with decrease in left pleural effusion, now small in character. Lungs otherwise clear. The cardiopericardial silhouette is within normal limits for size. The visualized bony structures of the thorax are intact.  IMPRESSION: 1. Interval resolution of right base atelectasis and tiny right pleural effusion. 2. Interval decrease in left pleural effusion with trace left base atelectasis/scar.   Electronically Signed   By: Misty Stanley M.D.   On: 08/21/2019 09:26  Impression: Tyler Sweeney is a 59 year old gentleman with known coronary disease who presented with unstable anginal symptoms.  He had coronary artery bypass grafting x4 on 07/25/2019.  He did well postoperatively and went home on day 4.  He still has some typical  incisional discomfort and hypersensitivity to touch.  That will resolve with time.  He is not taking any narcotics.  He may begin driving on a limited basis.  Appropriate precautions were discussed.  He should not lift anything over 10 pounds for another 2 weeks and after that can build up gradually.  He is anxious to return to work.  He wants to start back next week I told him he could start back at about 2 hours a day for the first week and then 4 hours a day for at least a week or 2 after that before he tries to go back full-time.  Left foot injury-currently in a boot.  He had questions about if surgery was necessary with that be an issue.  He is fine to have a procedure there if needed.  Hopefully, that can be avoided.  He does have a very small left effusion on chest x-ray.  See him back in about a month to check on that  Plan: Return in 1 month with PA lateral chest x-ray  Melrose Nakayama, MD [facility] [phone]

## 2019-08-27 ENCOUNTER — Other Ambulatory Visit: Payer: Self-pay | Admitting: Cardiology

## 2019-09-12 ENCOUNTER — Other Ambulatory Visit: Payer: Self-pay | Admitting: Thoracic Surgery (Cardiothoracic Vascular Surgery)

## 2019-09-12 DIAGNOSIS — Z951 Presence of aortocoronary bypass graft: Secondary | ICD-10-CM

## 2019-09-18 ENCOUNTER — Other Ambulatory Visit: Payer: Self-pay

## 2019-09-18 ENCOUNTER — Ambulatory Visit (INDEPENDENT_AMBULATORY_CARE_PROVIDER_SITE_OTHER): Payer: Self-pay | Admitting: Thoracic Surgery (Cardiothoracic Vascular Surgery)

## 2019-09-18 ENCOUNTER — Encounter: Payer: Self-pay | Admitting: Thoracic Surgery (Cardiothoracic Vascular Surgery)

## 2019-09-18 ENCOUNTER — Ambulatory Visit
Admission: RE | Admit: 2019-09-18 | Discharge: 2019-09-18 | Disposition: A | Discharge status: Home / Self Care | Payer: Commercial Managed Care - PPO | Source: Ambulatory Visit | Attending: Thoracic Surgery (Cardiothoracic Vascular Surgery) | Admitting: Thoracic Surgery (Cardiothoracic Vascular Surgery)

## 2019-09-18 VITALS — BP 170/100 | HR 80 | Resp 20 | Ht 71.0 in | Wt 183.0 lb

## 2019-09-18 DIAGNOSIS — I2510 Atherosclerotic heart disease of native coronary artery without angina pectoris: Secondary | ICD-10-CM

## 2019-09-18 DIAGNOSIS — Z951 Presence of aortocoronary bypass graft: Secondary | ICD-10-CM

## 2019-09-18 MED ORDER — METOPROLOL SUCCINATE ER 50 MG PO TB24: 50.0000 mg | ORAL_TABLET | Freq: Every day | ORAL | 5 refills | Status: DC

## 2019-09-18 NOTE — Progress Notes (Signed)
[address]       [place] [address]             [phone]      HPI: Tyler Sweeney returns for a scheduled follow-up visit  Tyler Sweeney is a 59 year old gentleman with a history of coronary disease, hyperlipidemia, and polycystic kidney disease. He presented with unstable angina in May 2021. He was found to have severe two-vessel coronary disease. He underwent coronary artery bypass grafting x4 on 07/25/2019. Postoperatively he did well and went home on day 4.  When I last saw him he had suffered a fall and injured his left foot. He was in a boot. He had some sternal incisional discomfort but was not taking any narcotics.  He feels well. He has been having some issues with taking his Lopressor as he is having to cut 50 mg pills into quarters and taking it twice a day. He is asking to go back on once a day metoprolol formulation. He does have some sensitivity around the sternum and also around the left wrist incision, but otherwise is doing well.  Past Medical History:  Diagnosis Date  . Anginal pain (Tyler Sweeney)   . Asthma    as a child  . CAD (coronary artery disease) 11/24/2012   11/24/12 Cath nl LM, 99%LAD post D1, 100% 1rst OM with collaterals, 40% circumflex, no RCA dz, s/p DES LAD & CFX/OM1 Dr. Tamala Julian    . Hyperlipidemia 11/24/2012  . MI, old 2014   Diagnosis based on left ventricular dysfunction seen on stress test  . Personal history of other diseases of digestive system   . Polycystic kidney disease    "dx'd but have never had any symptoms" (07/05/2013)    Current Outpatient Medications  Medication Sig Dispense Refill  . aspirin 81 MG chewable tablet Chew 1 tablet (81 mg total) by mouth daily. 30 tablet 12  . Cholecalciferol (VITAMIN D3 PO) Take 1 tablet by mouth daily.    . nitroGLYCERIN (NITROSTAT) 0.4 MG SL tablet Place 1 tablet (0.4 mg total) under the tongue every 5 (five) minutes as needed for chest pain. 25 tablet 11  . rosuvastatin (CRESTOR) 10 MG  tablet Take 1 tablet (10 mg total) by mouth daily. 90 tablet 1  . VITAMIN K PO Take 1 tablet by mouth daily.    . metoprolol succinate (TOPROL XL) 50 MG 24 hr tablet Take 1 tablet (50 mg total) by mouth daily. Take with or immediately following a meal. 30 tablet 5   No current facility-administered medications for this visit.    Physical Exam BP (!) 170/100   Pulse 80   Resp 20   Ht 5\' 11"  (1.803 m)   Wt 183 lb (83 kg)   SpO2 100% Comment: RA  BMI 25.56 kg/m  59 year old man in no acute distress Well-developed and well-nourished Alert and oriented x3 with no focal deficits Cardiac regular rate and rhythm normal S1-S2 Sternum stable, incision well-healed Left arm incision well-healed Lungs clear with equal breath sounds bilaterally  Diagnostic Tests: CHEST - 2 VIEW  COMPARISON:  08/21/2019  FINDINGS: Prior CABG. Small left pleural effusion. No confluent airspace opacities. Heart is normal size. No acute bony abnormality.  IMPRESSION: Small left pleural effusion.   Electronically Signed   By: Rolm Baptise M.D.   On: 09/18/2019 16:24  I personally reviewed the chest x-ray images. There is a tiny left pleural effusion that is clinically insignificant.  Impression: Tyler Sweeney  is a 59 year old man with known coronary disease who presented with unstable angina. He was found to have severe two-vessel disease. He underwent coronary bypass grafting x4 about 2 months ago.  Currently he is doing well. He does have some mild incisional discomfort but is not having to take any pain medication. He has returned to work. He had a lot of questions about how vigorously he could exercise. I encouraged him to gradually push himself.  His blood pressure was elevated at 170/100 today. He was on 50 mg of Toprol-XL daily prior to surgery. He has been on 12.5 mg of Lopressor twice daily since then. I am going to put him back on the 50 mg tablet of Toprol-XL once daily and stop the  Lopressor.  Plan: Toprol-XL 50 mg p.o. daily Follow-up with Dr. Bettina Gavia I will be happy to see Mr. Downum back anytime if I can be of any further assistance with his care  Melrose Nakayama, MD [facility] [phone]

## 2019-09-21 ENCOUNTER — Encounter (HOSPITAL_COMMUNITY): Payer: Self-pay

## 2019-09-21 ENCOUNTER — Telehealth (HOSPITAL_COMMUNITY): Payer: Self-pay

## 2019-09-21 NOTE — Telephone Encounter (Signed)
Attempted to call patient in regards to Cardiac Rehab - LM on VM Mailed letter 

## 2019-10-05 ENCOUNTER — Telehealth (HOSPITAL_COMMUNITY): Payer: Self-pay

## 2019-10-05 NOTE — Telephone Encounter (Signed)
No response from pt regarding CR.  Closed referral.  

## 2019-10-10 ENCOUNTER — Other Ambulatory Visit: Payer: Self-pay

## 2019-10-10 ENCOUNTER — Ambulatory Visit (INDEPENDENT_AMBULATORY_CARE_PROVIDER_SITE_OTHER): Payer: Commercial Managed Care - PPO | Admitting: Cardiology

## 2019-10-10 ENCOUNTER — Encounter: Payer: Self-pay | Admitting: Cardiology

## 2019-10-10 VITALS — BP 150/98 | HR 70 | Ht 71.0 in | Wt 185.0 lb

## 2019-10-10 DIAGNOSIS — E7841 Elevated Lipoprotein(a): Secondary | ICD-10-CM | POA: Diagnosis not present

## 2019-10-10 DIAGNOSIS — Z951 Presence of aortocoronary bypass graft: Secondary | ICD-10-CM

## 2019-10-10 DIAGNOSIS — E785 Hyperlipidemia, unspecified: Secondary | ICD-10-CM

## 2019-10-10 NOTE — Progress Notes (Signed)
Cardiology Office Note:    Date:  10/10/2019   ID:  Tyler Sweeney, DOB [birth year], MRN [id number]  PCP:  Orpah Melter, MD  Cardiologist:  Shirlee More, MD    Referring MD: Orpah Melter, MD    ASSESSMENT:    1. S/P CABG x 4   2. Dyslipidemia, goal LDL below 70   3. Hyperlipidemia, unspecified hyperlipidemia type   4. Elevated Lp(a)    PLAN:    In order of problems listed above:  1. He has made a good recovery from bypass surgery having no angina at this time is concerned about how to change the future with recurrent cardiac events and will intensify lipid-lowering therapy with excess LP(a) adding PCSK9 inhibitor to his high intensity statin.  Goal LDL will be 25-50.  He has to delay until September traveling to [place] and require follow-up lipid profile about 6 weeks after. 2. Stable after bypass surgery wounds healed continue medical treatment aspirin beta-blocker lipid-lowering I have asked him to check home blood pressures as I am concerned about his potential for hypertension needing another agent like ARB or ACE inhibitor   Next appointment: 3 months   Medication Adjustments/Labs and Tests Ordered: Current medicines are reviewed at length with the patient today.  Concerns regarding medicines are outlined above.  Orders Placed This Encounter  Procedures  . AMB Referral to [facility]   No orders of the defined types were placed in this encounter.   Chief Complaint  Patient presents with  . Follow-up  . Coronary Artery Disease    History of Present Illness:    Tyler Sweeney is a 59 y.o. male with a hx of CAD hyperlipidemia and polycystic kidney disease.  He was last seen by me 02/20/2019.  He has elevated lipoprotein a level on statin.  In May 2021 presented with unstable angina found to have severe two-vessel coronary artery disease and underwent CABG 07/25/2019.  Preoperative carotid duplex showed very mild 1 to 39% bilateral ICA  stenosis. Compliance with diet, lifestyle and medications: Yes  Is recovered from bypass surgery but is frustrated by the progressive nature of his CAD.  We had noted that excess LP(a) specific treatment is not available in the market however PCSK9 inhibitors are effective emigrant have 1 to a statin with a goal LDL of 25-50 and transition to the specific oligonucleotide messenger RNA inhibitor when available in the market.  Home blood pressures at target repeat by me is and asked him to pay close attention.  He is not having chest pain shortness of breath palpitation or syncope. Past Medical History:  Diagnosis Date  . Anginal pain ([place])   . Asthma    as a child  . CAD (coronary artery disease) 11/24/2012   11/24/12 Cath nl LM, 99%LAD post D1, 100% 1rst OM with collaterals, 40% circumflex, no RCA dz, s/p DES LAD & CFX/OM1 Dr. Tamala Julian    . Hyperlipidemia 11/24/2012  . MI, old 2014   Diagnosis based on left ventricular dysfunction seen on stress test  . Personal history of other diseases of digestive system   . Polycystic kidney disease    "dx'd but have never had any symptoms" (07/05/2013)    Past Surgical History:  Procedure Laterality Date  . COLONOSCOPY  11/02/2011   Procedure: COLONOSCOPY;  Surgeon: Winfield Cunas., MD;  Location: [facility];  Service: Endoscopy;  Laterality: N/A;  . CORONARY ANGIOPLASTY  07/05/2013  . CORONARY ANGIOPLASTY WITH STENT PLACEMENT  11/24/2012   "  3 stents"  . CORONARY ARTERY BYPASS GRAFT N/A 07/25/2019   Procedure: CORONARY ARTERY BYPASS GRAFTING (CABG) x4 WITH LEFT RADIAL ARTERY HARVEST.  LIMA TO LAD, RADIAL ARTERY TO OM1 AND OM2 SEQUENTIAL, SVG TO DIAGONAL.;  Surgeon: Melrose Nakayama, MD;  Location: [facility];  Service: Open Heart Surgery;  Laterality: N/A;  . ENDOVEIN HARVEST OF GREATER SAPHENOUS VEIN Right 07/25/2019   Procedure: [place] Of Greater Saphenous Vein;  Surgeon: Melrose Nakayama, MD;  Location: [facility];  Service: Open Heart  Surgery;  Laterality: Right;  . FOOT SURGERY Left 1984   "cyst"  . INGUINAL HERNIA REPAIR Right 1962  . LAPAROSCOPIC APPENDECTOMY N/A 09/19/2016   Procedure: APPENDECTOMY LAPAROSCOPIC;  Surgeon: Ralene Ok, MD;  Location: [facility];  Service: General;  Laterality: N/A;  . LEFT HEART CATH AND CORONARY ANGIOGRAPHY N/A 07/24/2019   Procedure: LEFT HEART CATH AND CORONARY ANGIOGRAPHY;  Surgeon: Burnell Blanks, MD;  Location: [facility];  Service: Cardiovascular;  Laterality: N/A;  . LEFT HEART CATHETERIZATION WITH CORONARY ANGIOGRAM N/A 11/24/2012   Procedure: LEFT HEART CATHETERIZATION WITH CORONARY ANGIOGRAM;  Surgeon: Jacolyn Reedy, MD;  Location: [facility] CATH LAB;  Service: Cardiovascular;  Laterality: N/A;  . LEFT HEART CATHETERIZATION WITH CORONARY ANGIOGRAM N/A 07/05/2013   Procedure: LEFT HEART CATHETERIZATION WITH CORONARY ANGIOGRAM;  Surgeon: Jacolyn Reedy, MD;  Location: [facility] CATH LAB;  Service: Cardiovascular;  Laterality: N/A;  . PERCUTANEOUS CORONARY STENT INTERVENTION (PCI-S)  11/24/2012   Procedure: PERCUTANEOUS CORONARY STENT INTERVENTION (PCI-S);  Surgeon: Jacolyn Reedy, MD;  Location: [facility];  Service: Cardiovascular;;  . PERCUTANEOUS CORONARY STENT INTERVENTION (PCI-S) Right 07/05/2013   Procedure: PERCUTANEOUS CORONARY STENT INTERVENTION (PCI-S);  Surgeon: Jacolyn Reedy, MD;  Location: [facility] CATH LAB;  Service: Cardiovascular;  Laterality: Right;  . TEE WITHOUT CARDIOVERSION N/A 07/25/2019   Procedure: TRANSESOPHAGEAL ECHOCARDIOGRAM (TEE);  Surgeon: Melrose Nakayama, MD;  Location: [facility];  Service: Open Heart Surgery;  Laterality: N/A;    Current Medications: Current Meds  Medication Sig  . aspirin 81 MG chewable tablet Chew 1 tablet (81 mg total) by mouth daily.  . Cholecalciferol (VITAMIN D3 PO) Take 1 tablet by mouth daily.  . metoprolol succinate (TOPROL XL) 50 MG 24 hr tablet Take 1 tablet (50 mg total) by mouth daily. Take with or immediately  following a meal.  . nitroGLYCERIN (NITROSTAT) 0.4 MG SL tablet Place 1 tablet (0.4 mg total) under the tongue every 5 (five) minutes as needed for chest pain.  . rosuvastatin (CRESTOR) 10 MG tablet Take 1 tablet (10 mg total) by mouth daily.  Marland Kitchen VITAMIN K PO Take 1 tablet by mouth daily.     Allergies:   Shellfish allergy, Gluten meal, Milk-related compounds, and Ramipril   Social History   Socioeconomic History  . Marital status: Married    Spouse name: Not on file  . Number of children: Not on file  . Years of education: Not on file  . Highest education level: Not on file  Occupational History  . 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 Smoking status: Never Smoker  . Smokeless tobacco: Never Used  Vaping Use  . Vaping Use: Never used  Substance and Sexual Activity  . Alcohol use: No  . Drug use: No  . Sexual activity: Yes  Other Topics Concern  . Not on file  Social History Narrative  . Not on file   Social Determinants  of Health   Financial Resource Strain:   . Difficulty of Paying Living Expenses:   Food Insecurity:   . Worried About Charity fundraiser in the Last Year:   . Arboriculturist in the Last Year:   Transportation Needs:   . Film/video editor (Medical):   Marland Kitchen Lack of Transportation (Non-Medical):   Physical Activity:   . Days of Exercise per Week:   . Minutes of Exercise per Session:   Stress:   . Feeling of Stress :   Social Connections:   . Frequency of Communication with Friends and Family:   . Frequency of Social Gatherings with Friends and Family:   . Attends Religious Services:   . Active Member of Clubs or Organizations:   . Attends Archivist Meetings:   Marland Kitchen Marital Status:      Family History: The patient's family history includes Coronary artery disease in an other family member; Heart attack (age of onset: 56) in his mother; Heart attack (age of onset: 39) in his father; Stroke (age of onset: 24)  in his mother. ROS:   Please see the history of present illness.    All other systems reviewed and are negative.  EKGs/Labs/Other Studies Reviewed:    The following studies were reviewed today:  EKG:  EKG 08/14/2019 independently reviewed sinus rhythm old septal MI  Recent Labs: 07/24/2019: ALT 18; TSH 1.279 07/26/2019: Magnesium 2.1 07/29/2019: Hemoglobin 8.7; Platelets 212 08/14/2019: BUN 14; Creatinine, Ser 1.29; Potassium 4.9; Sodium 133  Recent Lipid Panel    Component Value Date/Time   CHOL 130 07/24/2019 1946   CHOL 146 02/20/2019 1650   TRIG 73 07/24/2019 1946   HDL 56 07/24/2019 1946   HDL 44 02/20/2019 1650   CHOLHDL 2.3 07/24/2019 1946   VLDL 15 07/24/2019 1946   LDLCALC 59 07/24/2019 1946   LDLCALC 61 02/20/2019 1650    Physical Exam:    VS:  BP (!) 150/98 (BP Location: Right Arm, Patient Position: Sitting, Cuff Size: Normal)   Pulse 70   Ht 5\' 11"  (1.803 m)   Wt 185 lb (83.9 kg)   SpO2 99%   BMI 25.80 kg/m     Wt Readings from Last 3 Encounters:  10/10/19 185 lb (83.9 kg)  09/18/19 183 lb (83 kg)  08/21/19 185 lb (83.9 kg)     GEN: All of his wounds are healed without keloid well nourished, well developed in no acute distress HEENT: Normal NECK: No JVD; No carotid bruits LYMPHATICS: No lymphadenopathy CARDIAC: RRR, no murmurs, rubs, gallops RESPIRATORY:  Clear to auscultation without rales, wheezing or rhonchi  ABDOMEN: Soft, non-tender, non-distended MUSCULOSKELETAL:  No edema; No deformity  SKIN: Warm and dry NEUROLOGIC:  Alert and oriented x 3 PSYCHIATRIC:  Normal affect    Signed, Shirlee More, MD  10/10/2019 11:44 AM    [place]

## 2019-10-10 NOTE — Patient Instructions (Signed)
Medication Instructions:  Your physician recommends that you continue on your current medications as directed. Please refer to the Current Medication list given to you today.  *If you need a refill on your cardiac medications before your next appointment, please call your pharmacy*   Lab Work: None If you have labs (blood work) drawn today and your tests are completely normal, you will receive your results only by: Marland Kitchen MyChart Message (if you have MyChart) OR . A paper copy in the mail If you have any lab test that is abnormal or we need to change your treatment, we will call you to review the results.   Testing/Procedures: None   Follow-Up: At [facility], you and your health needs are our priority.  As part of our continuing mission to provide you with exceptional heart care, we have created designated Provider Care Teams.  These Care Teams include your primary Cardiologist (physician) and Advanced Practice Providers (APPs -  Physician Assistants and Nurse Practitioners) who all work together to provide you with the care you need, when you need it.  We recommend signing up for the patient portal called "MyChart".  Sign up information is provided on this After Visit Summary.  MyChart is used to connect with patients for Virtual Visits (Telemedicine).  Patients are able to view lab/test results, encounter notes, upcoming appointments, etc.  Non-urgent messages can be sent to your provider as well.   To learn more about what you can do with MyChart, go to [email or web address].    Your next appointment:   3 month(s)  The format for your next appointment:   In Person  Provider:   Shirlee More, MD   Other Instructions We have put in a referral for you to see our lipid clinic. They will call you to schedule this appointment.

## 2019-10-26 ENCOUNTER — Telehealth: Payer: Self-pay | Admitting: *Deleted

## 2019-10-26 MED ORDER — NITROGLYCERIN 0.4 MG SL SUBL: 0.4000 mg | SUBLINGUAL_TABLET | SUBLINGUAL | 11 refills | Status: DC | PRN

## 2019-10-26 NOTE — Telephone Encounter (Signed)
Rx refill sent to pharmacy. 

## 2019-11-15 ENCOUNTER — Ambulatory Visit (INDEPENDENT_AMBULATORY_CARE_PROVIDER_SITE_OTHER): Payer: Commercial Managed Care - PPO | Admitting: Pharmacist

## 2019-11-15 ENCOUNTER — Encounter: Payer: Self-pay | Admitting: Pharmacist

## 2019-11-15 ENCOUNTER — Other Ambulatory Visit: Payer: Self-pay

## 2019-11-15 DIAGNOSIS — E785 Hyperlipidemia, unspecified: Secondary | ICD-10-CM

## 2019-11-15 DIAGNOSIS — Z951 Presence of aortocoronary bypass graft: Secondary | ICD-10-CM | POA: Diagnosis not present

## 2019-11-15 MED ORDER — PRALUENT 75 MG/ML ~~LOC~~ SOAJ: 1.0000 mL | SUBCUTANEOUS | 1 refills | Status: AC

## 2019-11-15 NOTE — Patient Instructions (Signed)
It was great meeting you today  We would like to lower your LDL down closer to 25  Dr. Bettina Gavia would like to start a medication called Praluent which you will inject once every 2 weeks  Continue your rosuvastatin every day  Please call with any questions  Karren Cobble, PharmD, [facility], [place] [address], [place], [place] [address] Phone: [phone]; Fax: [phone] 11/15/2019 3:07 PM

## 2019-11-15 NOTE — Progress Notes (Addendum)
Patient ID: Tyler Sweeney                 DOB: 05-17-1960                    MRN: [id number]     HPI: Thelmer Legler is a 59 y.o. male patient referred to lipid clinic by Dr Bettina Gavia. PMH is significant for CABG x4, HLD, elevated Lp(A), angina, CAD, and chronic renal insufficiency.      Patient has history of stenting in CFX in 2014 and PTCA CFX in 2015.  Exercises 4 to 5 times a week with cardio and other exercises.  May 2021 woke up with chest pain , found to have 99% mid LAD stenosis.  Underwent successful CABG and at follow up cardiologist recommended PCSK9i in addition to rosuvastatin.  Patient presents today in good spirits. Continues to eat a heart healthy diet and exercises frequently.  Remains motivated to not have further cardiac procedures.    Current Medications: rosuvastatin 10 mg daily Intolerances: n/a Risk Factors: CAD, HTN, Hx of CABG and PCI LDL goal:  25-50 per Dr Bettina Gavia  Family History: The patient's family history includes Coronary artery disease in an other family member; Heart attack (age of onset: 32) in his mother; Heart attack (age of onset: 2) in his father; Stroke (age of onset: 55) in his mother.    Labs: TC 149, Trigs 134, LDL 73, HDL 49  HDL 56, LDL 59, lipoprotein a 81.2, Trigs 73, VLDL 15, TC 130 (07/24/19 on rosuvastatin)  Past Medical History:  Diagnosis Date  . Anginal pain ([place])   . Asthma    as a child  . CAD (coronary artery disease) 11/24/2012   11/24/12 Cath nl LM, 99%LAD post D1, 100% 1rst OM with collaterals, 40% circumflex, no RCA dz, s/p DES LAD & CFX/OM1 Dr. Tamala Julian    . Hyperlipidemia 11/24/2012  . MI, old 2014   Diagnosis based on left ventricular dysfunction seen on stress test  . Personal history of other diseases of digestive system   . Polycystic kidney disease    "dx'd but have never had any symptoms" (07/05/2013)    Current Outpatient Medications on File Prior to Visit  Medication Sig Dispense Refill  . aspirin 81 MG chewable tablet  Chew 1 tablet (81 mg total) by mouth daily. 30 tablet 12  . Cholecalciferol (VITAMIN D3 PO) Take 1 tablet by mouth daily.    . metoprolol succinate (TOPROL XL) 50 MG 24 hr tablet Take 1 tablet (50 mg total) by mouth daily. Take with or immediately following a meal. 30 tablet 5  . nitroGLYCERIN (NITROSTAT) 0.4 MG SL tablet Place 1 tablet (0.4 mg total) under the tongue every 5 (five) minutes as needed for chest pain. 25 tablet 11  . rosuvastatin (CRESTOR) 10 MG tablet Take 1 tablet (10 mg total) by mouth daily. 90 tablet 1  . VITAMIN K PO Take 1 tablet by mouth daily.     No current facility-administered medications on file prior to visit.    Allergies  Allergen Reactions  . Shellfish Allergy Anaphylaxis  . Gluten Meal Other (See Comments)    "can't have", "get inside vibration and get weaker"  . Milk-Related Compounds Other (See Comments)    Stomach issues, "get weaker"  . Ramipril Other (See Comments)    Head spinning and heart racing    Assessment/Plan:  1. Hyperlipidemia - Despite LDL at 59 on rosuvastatin, a heart healthy diet, and frequent  exercise, patient has had frequent cardiac events.   Needs further lipid lowering due to risk factors and excess LP(a).  Patient willing to try praluent every 2 weeks.  Using demo pen, educated patient on storage, site selection, and administration.  Patient was able to demonstrate in room with demo pen.  Printed copay card for patient.  Advised that Dr. Bettina Gavia will want to recheck lipid panel ~6 weeks after starting new medication.  Patient voiced understanding.  Karren Cobble, PharmD, BCACP, [facility] [address] [place], [place] [address] Phone: [phone]; Fax: [phone] 11/15/2019 4:37 PM

## 2019-11-22 ENCOUNTER — Telehealth: Payer: Self-pay

## 2019-11-22 NOTE — Telephone Encounter (Signed)
Called and spoke w/pt stated that the praluent 75mg  was approved and instructed them to use the copay card the pharmd gave them. Pt voiced understanding

## 2020-01-08 ENCOUNTER — Telehealth: Payer: Self-pay

## 2020-01-08 NOTE — Telephone Encounter (Signed)
° °  [facility] Pre-operative Risk Assessment    HEARTCARE STAFF: - Please ensure there is not already an duplicate clearance open for this procedure. - Under Visit Info/Reason for Call, type in Other and utilize the format Clearance MM/DD/YY or Clearance TBD. Do not use dashes or single digits. - If request is for dental extraction, please clarify the # of teeth to be extracted.  Request for surgical clearance:  1. What type of surgery is being performed? Left knee scope, removal great toe bony proximus   2. When is this surgery scheduled? 01-21-20   3. What type of clearance is required (medical clearance vs. Pharmacy clearance to hold med vs. Both)? Both  4. Are there any medications that need to be held prior to surgery and how long? Aspirin  5. Practice name and name of physician performing surgery? [facility]- Dr. Berenice Primas   6. What is the office phone number? [phone]   7.   What is the office fax number? [phone]  8.   Anesthesia type (None, local, MAC, general) ?    Lowella Grip 01/08/2020, 8:41 AM  _________________________________________________________________   (provider comments below)

## 2020-01-09 NOTE — Telephone Encounter (Signed)
Left message for the patient to call back and speak to the on call preop APP 

## 2020-01-10 NOTE — Telephone Encounter (Signed)
Went straight to voicemail

## 2020-01-11 NOTE — Telephone Encounter (Signed)
   Primary Cardiologist: Shirlee More, MD  Chart reviewed as part of pre-operative protocol coverage. Patient was last seen by Dr. Bettina Gavia in 10/2019 at which he was doing well from a cardiac standpoint and recovering from CABG in 07/2019 well. Patient was contacted today for further pre-op evaluation. He reports doing well since last visit. No chest pain, shortness of breath, orthopnea, PND, palpitations, lightheadedness, dizziness, or syncope. He is exercising 4-5 times a week without any problems. Given past medical history and time since last visit, based on ACC/AHA guidelines, Joash Tony would be at acceptable risk for the planned procedure without further cardiovascular testing.   Regarding Aspirin therapy, we recommend continuation of Aspirin throughout the perioperative period.  However, if the surgeon feels that cessation of Aspirin is required in the perioperative period, it may be stopped 5-7 days prior to surgery with a plan to resume it as soon as felt to be feasible from a surgical standpoint in the post-operative period.  I will route this recommendation to the requesting party via Epic fax function and remove from pre-op pool.  Please call with questions.  Darreld Mclean, PA-C 01/11/2020, 2:08 PM

## 2020-03-30 ENCOUNTER — Other Ambulatory Visit: Payer: Self-pay | Admitting: Thoracic Surgery (Cardiothoracic Vascular Surgery)

## 2020-04-01 DIAGNOSIS — I209 Angina pectoris, unspecified: Secondary | ICD-10-CM | POA: Insufficient documentation

## 2020-04-01 DIAGNOSIS — Q613 Polycystic kidney, unspecified: Secondary | ICD-10-CM | POA: Insufficient documentation

## 2020-04-01 DIAGNOSIS — J45909 Unspecified asthma, uncomplicated: Secondary | ICD-10-CM | POA: Insufficient documentation

## 2020-04-09 ENCOUNTER — Ambulatory Visit: Payer: Commercial Managed Care - PPO | Admitting: Cardiology

## 2020-04-10 NOTE — Progress Notes (Unsigned)
Cardiology Office Note:    Date:  04/11/2020   ID:  Tyler Sweeney, DOB Mar 01, 1961, MRN [id number]  PCP:  Orpah Melter, MD  Cardiologist:  Shirlee More, MD    Referring MD: Orpah Melter, MD    ASSESSMENT:    1. S/P CABG x 4   2. Elevated Lp(a)   3. Dyslipidemia, goal LDL below 70    PLAN:    In order of problems listed above:  1. Overall he is doing well he is having symptoms that are not usual exertional angina but are persistent after bypass surgery and raises concerns on his part of recurrent ischemia.  We will continue medical therapy with aspirin beta-blocker high intensity statin and PCSK9 therapy check lipid profile LP(a) level and for reassurance a stress Myoview test.  I told him I thought would be unlikely to have early postoperative graft failure.  Clinically I think he is having the usual discomfort from stripping the left thoracic artery   Next appointment: 6 months   Medication Adjustments/Labs and Tests Ordered: Current medicines are reviewed at length with the patient today.  Concerns regarding medicines are outlined above.  Orders Placed This Encounter  Procedures  . Comprehensive metabolic panel  . Lipoprotein A (LPA)  . Lipid panel  . MYOCARDIAL PERFUSION IMAGING  . EKG 12-Lead   No orders of the defined types were placed in this encounter.   Chief Complaint  Patient presents with  . Follow-up  . Coronary Artery Disease  . Hyperlipidemia    With elevated LP(a) on PCSK9 therapy    History of Present Illness:    Tyler Sweeney is a 60 y.o. male with a hx of CAD polycystic kidney disease hyperlipidemia with elevated lipoprotein a level on a statin and had CABG 07/25/2019.  Hewas last seen 10/10/2019. Compliance with diet, lifestyle and medications: Yes  On 1 hand he is doing well vigorously exercises strength and endurance are recovering tolerates his lipid-lowering therapy. On the other hand he has the dysesthesia from stripping the left  thoracic artery at times gets chest discomfort with and without activities not every day not severe or limiting but occurs enough that it makes him concerned that he is able recurrent cardiovascular symptoms and at times he describes it as tightness substernal.  No shortness of breath not pleuritic no radiation nausea vomiting or diaphoresis and not severe. Past Medical History:  Diagnosis Date  . Anginal pain ([place])   . Asthma    as a child  . CAD (coronary artery disease) 11/24/2012   11/24/12 Cath nl LM, 99%LAD post D1, 100% 1rst OM with collaterals, 40% circumflex, no RCA dz, s/p DES LAD & CFX/OM1 Dr. Tamala Julian    . Hyperlipidemia 11/24/2012  . MI, old 2014   Diagnosis based on left ventricular dysfunction seen on stress test  . Personal history of other diseases of digestive system   . Polycystic kidney disease    "dx'd but have never had any symptoms" (07/05/2013)    Past Surgical History:  Procedure Laterality Date  . COLONOSCOPY  11/02/2011   Procedure: COLONOSCOPY;  Surgeon: Winfield Cunas., MD;  Location: [facility];  Service: Endoscopy;  Laterality: N/A;  . CORONARY ANGIOPLASTY  07/05/2013  . CORONARY ANGIOPLASTY WITH STENT PLACEMENT  11/24/2012   "3 stents"  . CORONARY ARTERY BYPASS GRAFT N/A 07/25/2019   Procedure: CORONARY ARTERY BYPASS GRAFTING (CABG) x4 WITH LEFT RADIAL ARTERY HARVEST.  LIMA TO LAD, RADIAL ARTERY TO OM1 AND OM2 SEQUENTIAL,  SVG TO DIAGONAL.;  Surgeon: Melrose Nakayama, MD;  Location: [facility];  Service: Open Heart Surgery;  Laterality: N/A;  . ENDOVEIN HARVEST OF GREATER SAPHENOUS VEIN Right 07/25/2019   Procedure: [place] Of Greater Saphenous Vein;  Surgeon: Melrose Nakayama, MD;  Location: [facility];  Service: Open Heart Surgery;  Laterality: Right;  . FOOT SURGERY Left 1984   "cyst"  . INGUINAL HERNIA REPAIR Right 1962  . LAPAROSCOPIC APPENDECTOMY N/A 09/19/2016   Procedure: APPENDECTOMY LAPAROSCOPIC;  Surgeon: Ralene Ok, MD;  Location: [facility];  Service: General;  Laterality: N/A;  . LEFT HEART CATH AND CORONARY ANGIOGRAPHY N/A 07/24/2019   Procedure: LEFT HEART CATH AND CORONARY ANGIOGRAPHY;  Surgeon: Burnell Blanks, MD;  Location: [facility];  Service: Cardiovascular;  Laterality: N/A;  . LEFT HEART CATHETERIZATION WITH CORONARY ANGIOGRAM N/A 11/24/2012   Procedure: LEFT HEART CATHETERIZATION WITH CORONARY ANGIOGRAM;  Surgeon: Jacolyn Reedy, MD;  Location: [facility] CATH LAB;  Service: Cardiovascular;  Laterality: N/A;  . LEFT HEART CATHETERIZATION WITH CORONARY ANGIOGRAM N/A 07/05/2013   Procedure: LEFT HEART CATHETERIZATION WITH CORONARY ANGIOGRAM;  Surgeon: Jacolyn Reedy, MD;  Location: [facility] CATH LAB;  Service: Cardiovascular;  Laterality: N/A;  . PERCUTANEOUS CORONARY STENT INTERVENTION (PCI-S)  11/24/2012   Procedure: PERCUTANEOUS CORONARY STENT INTERVENTION (PCI-S);  Surgeon: Jacolyn Reedy, MD;  Location: [facility] CATH LAB;  Service: Cardiovascular;;  . PERCUTANEOUS CORONARY STENT INTERVENTION (PCI-S) Right 07/05/2013   Procedure: PERCUTANEOUS CORONARY STENT INTERVENTION (PCI-S);  Surgeon: Jacolyn Reedy, MD;  Location: [facility] CATH LAB;  Service: Cardiovascular;  Laterality: Right;  . TEE WITHOUT CARDIOVERSION N/A 07/25/2019   Procedure: TRANSESOPHAGEAL ECHOCARDIOGRAM (TEE);  Surgeon: Melrose Nakayama, MD;  Location: [facility];  Service: Open Heart Surgery;  Laterality: N/A;    Current Medications: Current Meds  Medication Sig  . aspirin 81 MG chewable tablet Chew 1 tablet (81 mg total) by mouth daily.  . Cholecalciferol (VITAMIN D3 PO) Take 1 tablet by mouth daily.  . metoprolol succinate (TOPROL-XL) 50 MG 24 hr tablet TAKE 1 TABLET (50 MG TOTAL) BY MOUTH DAILY. TAKE WITH OR IMMEDIATELY FOLLOWING A MEAL.  . nitroGLYCERIN (NITROSTAT) 0.4 MG SL tablet Place 1 tablet (0.4 mg total) under the tongue every 5 (five) minutes as needed for chest pain.  Marland Kitchen PRALUENT 75 MG/ML SOAJ Inject 75 mLs as directed every 14 (fourteen)  days.  . rosuvastatin (CRESTOR) 10 MG tablet Take 1 tablet (10 mg total) by mouth daily.  Marland Kitchen VITAMIN K PO Take 1 tablet by mouth daily.     Allergies:   Shellfish allergy, Gluten meal, Milk-related compounds, and Ramipril   Social History   Socioeconomic History  . Marital status: Married    Spouse name: Not on file  . Number of children: Not on file  . Years of education: Not on file  . Highest education level: Not on file  Occupational History  . 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 Smoking status: Never Smoker  . Smokeless tobacco: Never Used  Vaping Use  . Vaping Use: Never used  Substance and Sexual Activity  . Alcohol use: No  . Drug use: No  . Sexual activity: Yes  Other Topics Concern  . Not on file  Social History Narrative  . Not on file   Social Determinants of Health   Financial Resource Strain: Not on file  Food Insecurity: Not on file  Transportation Needs: Not on  file  Physical Activity: Not on file  Stress: Not on file  Social Connections: Not on file     Family History: The patient's family history includes Coronary artery disease in an other family member; Heart attack (age of onset: 23) in his mother; Heart attack (age of onset: 55) in his father; Stroke (age of onset: 44) in his mother. ROS:   Please see the history of present illness.    All other systems reviewed and are negative.  EKGs/Labs/Other Studies Reviewed:    The following studies were reviewed today:  EKG:  EKG ordered today and personally reviewed.  The ekg ordered today demonstrates sinus rhythm old anterior MI acute changes 08/14/2019  Recent Labs: 07/24/2019: ALT 18; TSH 1.279 07/26/2019: Magnesium 2.1 07/29/2019: Hemoglobin 8.7; Platelets 212 08/14/2019: BUN 14; Creatinine, Ser 1.29; Potassium 4.9; Sodium 133  Recent Lipid Panel    Component Value Date/Time   CHOL 130 07/24/2019 1946   CHOL 146 02/20/2019 1650   TRIG 73 07/24/2019 1946   HDL  56 07/24/2019 1946   HDL 44 02/20/2019 1650   CHOLHDL 2.3 07/24/2019 1946   VLDL 15 07/24/2019 1946   LDLCALC 59 07/24/2019 1946   LDLCALC 61 02/20/2019 1650    Physical Exam:    VS:  BP 114/78   Pulse 84   Ht 5\' 11"  (1.803 m)   Wt 188 lb 1.3 oz (85.3 kg)   SpO2 99%   BMI 26.23 kg/m     Wt Readings from Last 3 Encounters:  04/11/20 188 lb 1.3 oz (85.3 kg)  10/10/19 185 lb (83.9 kg)  09/18/19 183 lb (83 kg)     GEN: Sternum is well-healed well nourished, well developed in no acute distress HEENT: Normal NECK: No JVD; No carotid bruits LYMPHATICS: No lymphadenopathy CARDIAC: 3RRR, no murmurs, rubs, gallops RESPIRATORY:  Clear to auscultation without rales, wheezing or rhonchi  ABDOMEN: Soft, non-tender, non-distended MUSCULOSKELETAL:  No edema; No deformity  SKIN: Warm and dry NEUROLOGIC:  Alert and oriented x 3 PSYCHIATRIC:  Normal affect    Signed, Shirlee More, MD  04/11/2020 8:14 AM    [place]

## 2020-04-11 ENCOUNTER — Telehealth: Payer: Self-pay

## 2020-04-11 ENCOUNTER — Other Ambulatory Visit: Payer: Self-pay

## 2020-04-11 ENCOUNTER — Encounter: Payer: Self-pay | Admitting: Cardiology

## 2020-04-11 ENCOUNTER — Ambulatory Visit: Payer: Commercial Managed Care - PPO | Admitting: Cardiology

## 2020-04-11 VITALS — BP 114/78 | HR 84 | Ht 71.0 in | Wt 188.1 lb

## 2020-04-11 DIAGNOSIS — E785 Hyperlipidemia, unspecified: Secondary | ICD-10-CM

## 2020-04-11 DIAGNOSIS — E7841 Elevated Lipoprotein(a): Secondary | ICD-10-CM

## 2020-04-11 DIAGNOSIS — Z951 Presence of aortocoronary bypass graft: Secondary | ICD-10-CM

## 2020-04-11 NOTE — Patient Instructions (Signed)
Medication Instructions:  Your physician recommends that you continue on your current medications as directed. Please refer to the Current Medication list given to you today.  *If you need a refill on your cardiac medications before your next appointment, please call your pharmacy*   Lab Work: Your physician recommends that you return for lab work in: TODAY CMP, Lipids, Lpa If you have labs (blood work) drawn today and your tests are completely normal, you will receive your results only by: Marland Kitchen MyChart Message (if you have MyChart) OR . A paper copy in the mail If you have any lab test that is abnormal or we need to change your treatment, we will call you to review the results.   Testing/Procedures:   [facility] Cardiovascular Imaging at [facility] [address] [place], [place] [address] Phone: [phone]    Please arrive 15 minutes prior to your appointment time for registration and insurance purposes.  The test will take approximately 3 to 4 hours to complete; you may bring reading material.  If someone comes with you to your appointment, they will need to remain in the main lobby due to limited space in the testing area. **If you are pregnant or breastfeeding, please notify the nuclear lab prior to your appointment**  How to prepare for your Myocardial Perfusion Test: . Do not eat or drink 3 hours prior to your test, except you may have water. . Do not consume products containing caffeine (regular or decaffeinated) 12 hours prior to your test. (ex: coffee, chocolate, sodas, tea). . Do bring a list of your current medications with you.  If not listed below, you may take your medications as normal. . Do wear comfortable clothes (no dresses or overalls) and walking shoes, tennis shoes preferred (No heels or open toe shoes are allowed). . Do NOT wear cologne, perfume, aftershave, or lotions (deodorant is allowed). . If these instructions are not followed, your  test will have to be rescheduled.  Please report to [address], [address] for your test.  If you have questions or concerns about your appointment, you can call the Nuclear Lab at [phone].  If you cannot keep your appointment, please provide 24 hours notification to the Nuclear Lab, to avoid a possible $50 charge to your account.    Follow-Up: At [facility], you and your health needs are our priority.  As part of our continuing mission to provide you with exceptional heart care, we have created designated Provider Care Teams.  These Care Teams include your primary Cardiologist (physician) and Advanced Practice Providers (APPs -  Physician Assistants and Nurse Practitioners) who all work together to provide you with the care you need, when you need it.  We recommend signing up for the patient portal called "MyChart".  Sign up information is provided on this After Visit Summary.  MyChart is used to connect with patients for Virtual Visits (Telemedicine).  Patients are able to view lab/test results, encounter notes, upcoming appointments, etc.  Non-urgent messages can be sent to your provider as well.   To learn more about what you can do with MyChart, go to [email or web address].    Your next appointment:   6 month(s)  The format for your next appointment:   In Person  Provider:   Shirlee More, MD   Other Instructions

## 2020-04-11 NOTE — Addendum Note (Signed)
Addended by: Shirlee More on: 04/11/2020 12:11 PM   Modules accepted: Orders

## 2020-04-11 NOTE — Telephone Encounter (Signed)
-----   Message from Frederic Jericho sent at 04/11/2020  8:36 AM EST ----- Regarding: ATTESTATION Good Friday Morning!   Patient needs to have ATTESTATION created and signed. We will need this before we can schedule.   Thank you

## 2020-04-11 NOTE — Progress Notes (Unsigned)
MYO

## 2020-04-12 LAB — COMPREHENSIVE METABOLIC PANEL
ALT: 17 IU/L (ref 0–44)
AST: 19 IU/L (ref 0–40)
Albumin/Globulin Ratio: 1.7 (ref 1.2–2.2)
Albumin: 4.2 g/dL (ref 3.8–4.9)
Alkaline Phosphatase: 106 IU/L (ref 44–121)
BUN/Creatinine Ratio: 11 (ref 10–24)
BUN: 15 mg/dL (ref 8–27)
Bilirubin Total: 0.3 mg/dL (ref 0.0–1.2)
CO2: 20 mmol/L (ref 20–29)
Calcium: 9 mg/dL (ref 8.6–10.2)
Chloride: 103 mmol/L (ref 96–106)
Creatinine, Ser: 1.39 mg/dL — ABNORMAL HIGH (ref 0.76–1.27)
GFR calc Af Amer: 63 mL/min/{1.73_m2} (ref >59)
GFR calc non Af Amer: 55 mL/min/{1.73_m2} — ABNORMAL LOW (ref >59)
Globulin, Total: 2.5 g/dL (ref 1.5–4.5)
Glucose: 128 mg/dL — ABNORMAL HIGH (ref 65–99)
Potassium: 4.1 mmol/L (ref 3.5–5.2)
Sodium: 137 mmol/L (ref 134–144)
Total Protein: 6.7 g/dL (ref 6.0–8.5)

## 2020-04-12 LAB — LIPID PANEL
Chol/HDL Ratio: 2.2 ratio (ref 0.0–5.0)
Cholesterol, Total: 89 mg/dL — ABNORMAL LOW (ref 100–199)
HDL: 41 mg/dL (ref >39)
LDL Chol Calc (NIH): 24 mg/dL (ref 0–99)
Triglycerides: 143 mg/dL (ref 0–149)
VLDL Cholesterol Cal: 24 mg/dL (ref 5–40)

## 2020-04-12 LAB — LIPOPROTEIN A (LPA): Lipoprotein (a): 82.3 nmol/L — ABNORMAL HIGH (ref <75.0)

## 2020-04-15 ENCOUNTER — Telehealth: Payer: Self-pay

## 2020-04-15 NOTE — Telephone Encounter (Signed)
-----   Message from Richardo Priest, MD sent at 04/13/2020  1:26 PM EST ----- Good result his LDL is ideal continue the current treatment.

## 2020-04-15 NOTE — Telephone Encounter (Signed)
Left a message to return my call.

## 2020-04-15 NOTE — Telephone Encounter (Signed)
Left message on patients voicemail to please return our call.   

## 2020-04-16 ENCOUNTER — Encounter (HOSPITAL_COMMUNITY): Payer: Self-pay

## 2020-04-16 ENCOUNTER — Emergency Department (HOSPITAL_COMMUNITY): Payer: Commercial Managed Care - PPO

## 2020-04-16 ENCOUNTER — Other Ambulatory Visit: Payer: Self-pay

## 2020-04-16 ENCOUNTER — Encounter (HOSPITAL_COMMUNITY)
Admission: EM | Disposition: A | Discharge status: Home / Self Care | Payer: Self-pay | Source: Home / Self Care | Attending: Emergency Medicine

## 2020-04-16 ENCOUNTER — Observation Stay (HOSPITAL_COMMUNITY)
Admission: EM | Admit: 2020-04-16 | Discharge: 2020-04-17 | Disposition: A | Discharge status: Home / Self Care | Payer: Commercial Managed Care - PPO | Attending: Cardiology | Admitting: Cardiology

## 2020-04-16 DIAGNOSIS — J45909 Unspecified asthma, uncomplicated: Secondary | ICD-10-CM | POA: Diagnosis not present

## 2020-04-16 DIAGNOSIS — Z9861 Coronary angioplasty status: Secondary | ICD-10-CM

## 2020-04-16 DIAGNOSIS — I2510 Atherosclerotic heart disease of native coronary artery without angina pectoris: Secondary | ICD-10-CM

## 2020-04-16 DIAGNOSIS — E785 Hyperlipidemia, unspecified: Secondary | ICD-10-CM | POA: Diagnosis not present

## 2020-04-16 DIAGNOSIS — I25110 Atherosclerotic heart disease of native coronary artery with unstable angina pectoris: Secondary | ICD-10-CM | POA: Diagnosis not present

## 2020-04-16 DIAGNOSIS — Z20822 Contact with and (suspected) exposure to covid-19: Secondary | ICD-10-CM | POA: Diagnosis not present

## 2020-04-16 DIAGNOSIS — I25710 Atherosclerosis of autologous vein coronary artery bypass graft(s) with unstable angina pectoris: Secondary | ICD-10-CM

## 2020-04-16 DIAGNOSIS — I200 Unstable angina: Secondary | ICD-10-CM | POA: Diagnosis present

## 2020-04-16 DIAGNOSIS — R0789 Other chest pain: Secondary | ICD-10-CM | POA: Diagnosis not present

## 2020-04-16 DIAGNOSIS — I252 Old myocardial infarction: Secondary | ICD-10-CM | POA: Insufficient documentation

## 2020-04-16 DIAGNOSIS — K219 Gastro-esophageal reflux disease without esophagitis: Secondary | ICD-10-CM | POA: Insufficient documentation

## 2020-04-16 DIAGNOSIS — Z951 Presence of aortocoronary bypass graft: Secondary | ICD-10-CM | POA: Diagnosis not present

## 2020-04-16 DIAGNOSIS — Z79899 Other long term (current) drug therapy: Secondary | ICD-10-CM | POA: Insufficient documentation

## 2020-04-16 DIAGNOSIS — R079 Chest pain, unspecified: Secondary | ICD-10-CM

## 2020-04-16 DIAGNOSIS — Z7982 Long term (current) use of aspirin: Secondary | ICD-10-CM | POA: Insufficient documentation

## 2020-04-16 HISTORY — PX: CARDIAC CATHETERIZATION: SHX172

## 2020-04-16 HISTORY — PX: LEFT HEART CATH AND CORS/GRAFTS ANGIOGRAPHY: CATH118250

## 2020-04-16 LAB — BASIC METABOLIC PANEL
Anion gap: 11 (ref 5–15)
BUN: 12 mg/dL (ref 6–20)
CO2: 24 mmol/L (ref 22–32)
Calcium: 9.2 mg/dL (ref 8.9–10.3)
Chloride: 102 mmol/L (ref 98–111)
Creatinine, Ser: 1.38 mg/dL — ABNORMAL HIGH (ref 0.61–1.24)
GFR, Estimated: 59 mL/min — ABNORMAL LOW (ref >60)
Glucose, Bld: 92 mg/dL (ref 70–99)
Potassium: 3.9 mmol/L (ref 3.5–5.1)
Sodium: 137 mmol/L (ref 135–145)

## 2020-04-16 LAB — CBC
HCT: 42.7 % (ref 39.0–52.0)
Hemoglobin: 14.1 g/dL (ref 13.0–17.0)
MCH: 29.7 pg (ref 26.0–34.0)
MCHC: 33 g/dL (ref 30.0–36.0)
MCV: 89.9 fL (ref 80.0–100.0)
Platelets: 261 10*3/uL (ref 150–400)
RBC: 4.75 MIL/uL (ref 4.22–5.81)
RDW: 13.3 % (ref 11.5–15.5)
WBC: 8.5 10*3/uL (ref 4.0–10.5)
nRBC: 0 % (ref 0.0–0.2)

## 2020-04-16 LAB — RESP PANEL BY RT-PCR (FLU A&B, COVID) ARPGX2
Influenza A by PCR: NEGATIVE
Influenza B by PCR: NEGATIVE
SARS Coronavirus 2 by RT PCR: NEGATIVE

## 2020-04-16 LAB — TROPONIN I (HIGH SENSITIVITY)
Troponin I (High Sensitivity): 8 ng/L (ref <18)
Troponin I (High Sensitivity): 7 ng/L (ref <18)

## 2020-04-16 LAB — MAGNESIUM: Magnesium: 1.8 mg/dL (ref 1.7–2.4)

## 2020-04-16 SURGERY — LEFT HEART CATH AND CORS/GRAFTS ANGIOGRAPHY
Anesthesia: LOCAL

## 2020-04-16 MED ORDER — LIDOCAINE HCL (PF) 1 % IJ SOLN
INTRAMUSCULAR | Status: AC
  Filled 2020-04-16: qty 30

## 2020-04-16 MED ORDER — ACETAMINOPHEN 325 MG PO TABS: 650.0000 mg | ORAL_TABLET | ORAL | Status: DC | PRN

## 2020-04-16 MED ORDER — SODIUM CHLORIDE 0.9% FLUSH: 3.0000 mL | INTRAVENOUS | Status: DC | PRN

## 2020-04-16 MED ORDER — HEPARIN (PORCINE) IN NACL 1000-0.9 UT/500ML-% IV SOLN
INTRAVENOUS | Status: AC
  Filled 2020-04-16: qty 1000

## 2020-04-16 MED ORDER — HEPARIN (PORCINE) 25000 UT/250ML-% IV SOLN
1100.0000 [IU]/h | INTRAVENOUS | Status: DC
  Administered 2020-04-16: 1100 [IU]/h via INTRAVENOUS
  Filled 2020-04-16: qty 250

## 2020-04-16 MED ORDER — FENTANYL CITRATE (PF) 100 MCG/2ML IJ SOLN
INTRAMUSCULAR | Status: DC | PRN
  Administered 2020-04-16: 50 ug via INTRAVENOUS

## 2020-04-16 MED ORDER — HEPARIN BOLUS VIA INFUSION
4000.0000 [IU] | Freq: Once | INTRAVENOUS | Status: AC
  Administered 2020-04-16: 4000 [IU] via INTRAVENOUS
  Filled 2020-04-16: qty 4000

## 2020-04-16 MED ORDER — SODIUM CHLORIDE 0.9 % WEIGHT BASED INFUSION: 1.0000 mL/kg/h | INTRAVENOUS | Status: DC

## 2020-04-16 MED ORDER — MIDAZOLAM HCL 2 MG/2ML IJ SOLN
INTRAMUSCULAR | Status: DC | PRN
  Administered 2020-04-16: 1 mg via INTRAVENOUS

## 2020-04-16 MED ORDER — ASPIRIN EC 81 MG PO TBEC
81.0000 mg | DELAYED_RELEASE_TABLET | Freq: Every day | ORAL | Status: DC
  Administered 2020-04-17: 81 mg via ORAL
  Filled 2020-04-16: qty 1

## 2020-04-16 MED ORDER — NITROGLYCERIN 0.4 MG SL SUBL: 0.4000 mg | SUBLINGUAL_TABLET | SUBLINGUAL | Status: DC | PRN

## 2020-04-16 MED ORDER — SODIUM CHLORIDE 0.9 % IV SOLN: INTRAVENOUS | Status: DC

## 2020-04-16 MED ORDER — SODIUM CHLORIDE 0.9 % IV SOLN: 250.0000 mL | INTRAVENOUS | Status: DC | PRN

## 2020-04-16 MED ORDER — NITROGLYCERIN 2 % TD OINT
1.0000 [in_us] | TOPICAL_OINTMENT | Freq: Four times a day (QID) | TRANSDERMAL | Status: DC
  Administered 2020-04-16: 1 [in_us] via TOPICAL
  Filled 2020-04-16: qty 1

## 2020-04-16 MED ORDER — IOHEXOL 350 MG/ML SOLN
INTRAVENOUS | Status: DC | PRN
  Administered 2020-04-16: 110 mL

## 2020-04-16 MED ORDER — ONDANSETRON HCL 4 MG/2ML IJ SOLN: 4.0000 mg | Freq: Four times a day (QID) | INTRAMUSCULAR | Status: DC | PRN

## 2020-04-16 MED ORDER — LIDOCAINE HCL (PF) 1 % IJ SOLN
INTRAMUSCULAR | Status: DC | PRN
  Administered 2020-04-16: 15 mL

## 2020-04-16 MED ORDER — FENTANYL CITRATE (PF) 100 MCG/2ML IJ SOLN
INTRAMUSCULAR | Status: AC
  Filled 2020-04-16: qty 2

## 2020-04-16 MED ORDER — SODIUM CHLORIDE 0.9% FLUSH
3.0000 mL | Freq: Two times a day (BID) | INTRAVENOUS | Status: DC
  Administered 2020-04-16: 3 mL via INTRAVENOUS

## 2020-04-16 MED ORDER — SODIUM CHLORIDE 0.9 % WEIGHT BASED INFUSION: 3.0000 mL/kg/h | INTRAVENOUS | Status: DC

## 2020-04-16 MED ORDER — ISOSORBIDE MONONITRATE ER 30 MG PO TB24
30.0000 mg | ORAL_TABLET | Freq: Every day | ORAL | Status: DC
  Administered 2020-04-16 – 2020-04-17 (×2): 30 mg via ORAL
  Filled 2020-04-16 (×2): qty 1

## 2020-04-16 MED ORDER — METOPROLOL SUCCINATE ER 50 MG PO TB24
50.0000 mg | ORAL_TABLET | Freq: Every evening | ORAL | Status: DC
  Administered 2020-04-16: 50 mg via ORAL
  Filled 2020-04-16: qty 1

## 2020-04-16 MED ORDER — ASPIRIN 81 MG PO CHEW
81.0000 mg | CHEWABLE_TABLET | ORAL | Status: AC
  Administered 2020-04-16: 81 mg via ORAL
  Filled 2020-04-16: qty 1

## 2020-04-16 MED ORDER — IOHEXOL 350 MG/ML SOLN
INTRAVENOUS | Status: AC
  Filled 2020-04-16: qty 1

## 2020-04-16 MED ORDER — MIDAZOLAM HCL 2 MG/2ML IJ SOLN
INTRAMUSCULAR | Status: AC
  Filled 2020-04-16: qty 2

## 2020-04-16 MED ORDER — ROSUVASTATIN CALCIUM 5 MG PO TABS
10.0000 mg | ORAL_TABLET | Freq: Every evening | ORAL | Status: DC
  Administered 2020-04-16: 10 mg via ORAL
  Filled 2020-04-16: qty 2

## 2020-04-16 MED ORDER — HEPARIN (PORCINE) IN NACL 1000-0.9 UT/500ML-% IV SOLN
INTRAVENOUS | Status: DC | PRN
  Administered 2020-04-16 (×2): 500 mL

## 2020-04-16 SURGICAL SUPPLY — 13 items
BAG SNAP BAND KOVER 36X36 (MISCELLANEOUS) ×2 IMPLANT
CATH INFINITI 5 FR IM (CATHETERS) ×2 IMPLANT
CATH INFINITI 5FR MULTPACK ANG (CATHETERS) ×2 IMPLANT
COVER DOME SNAP 22 D (MISCELLANEOUS) ×2 IMPLANT
DEVICE CLOSURE MYNXGRIP 5F (Vascular Products) ×2 IMPLANT
KIT HEART LEFT (KITS) ×2 IMPLANT
KIT MICROPUNCTURE NIT STIFF (SHEATH) ×2 IMPLANT
PACK CARDIAC CATHETERIZATION (CUSTOM PROCEDURE TRAY) ×2 IMPLANT
SHEATH PINNACLE 5F 10CM (SHEATH) ×2 IMPLANT
TRANSDUCER W/STOPCOCK (MISCELLANEOUS) ×2 IMPLANT
TUBING CIL FLEX 10 FLL-RA (TUBING) ×2 IMPLANT
WIRE EMERALD 3MM-J .035X150CM (WIRE) ×2 IMPLANT
WIRE EMERALD 3MM-J .035X260CM (WIRE) ×2 IMPLANT

## 2020-04-16 NOTE — ED Notes (Signed)
Pt to cath lab.

## 2020-04-16 NOTE — ED Notes (Signed)
Cardiology at bedside.

## 2020-04-16 NOTE — Progress Notes (Addendum)
ANTICOAGULATION CONSULT NOTE - Initial Consult  Pharmacy Consult for heparin Indication: unstable angina  Allergies  Allergen Reactions  . Shellfish Allergy Anaphylaxis  . Gluten Meal Other (See Comments)    "can't have", "get inside vibration and get weaker"  . Milk-Related Compounds Other (See Comments)    Stomach issues, "get weaker"  . Ramipril Other (See Comments)    Head spinning and heart racing    Patient Measurements:   Heparin Dosing Weight: 85.3 kg  Vital Signs: Temp: 97.6 F (36.4 C) (02/09 0503) Temp Source: Oral (02/09 0503) BP: 129/96 (02/09 1100) Pulse Rate: 65 (02/09 1100)  Labs: Recent Labs    04/16/20 0517 04/16/20 0726  HGB 14.1  --   HCT 42.7  --   PLT 261  --   CREATININE 1.38*  --   TROPONINIHS 8 7    Estimated Creatinine Clearance: 60.6 mL/min (A) (by C-G formula based on SCr of 1.38 mg/dL (H)).   Medical History: Past Medical History:  Diagnosis Date  . Anginal pain ([place])   . Asthma    as a child  . CAD (coronary artery disease) 11/24/2012   11/24/12 Cath nl LM, 99%LAD post D1, 100% 1rst OM with collaterals, 40% circumflex, no RCA dz, s/p DES LAD & CFX/OM1 Dr. Tamala Julian    . Hyperlipidemia 11/24/2012  . MI, old 2014   Diagnosis based on left ventricular dysfunction seen on stress test  . Personal history of other diseases of digestive system   . Polycystic kidney disease    "dx'd but have never had any symptoms" (07/05/2013)    Medications:  Scheduled:  . aspirin  81 mg Oral Pre-Cath  . [START ON 04/17/2020] aspirin EC  81 mg Oral Daily  . heparin  4,000 Units Intravenous Once  . nitroGLYCERIN  1 inch Topical Q6H  . sodium chloride flush  3 mL Intravenous Q12H    Assessment: 60 yo male admitted for chest tightness.  Not on AC PTA. Consistent w/ unstable angina.  Hx of prior PCI in 2014 and NSTEMI in 2021. Cardiology planning for cath later today (2/9).  CBC stable. Will give heparin bolus and initiate heparin drip at 1100 units/hr  (~13units/kg/hr).   Goal of Therapy:  Heparin level 0.3-0.7 units/ml Monitor platelets by anticoagulation protocol: Yes   Plan:  Heparin 4000 units bolus x1, followed by Heparin 1100 units/hr F/u 6 hour HL (if not in cath) F/u post cath plans  Monitor s/sx bleeding, daily CBC  Dimple Nanas, PharmD PGY-1 Acute Care Pharmacy Resident Office: [phone] 04/16/2020 12:01 PM

## 2020-04-16 NOTE — ED Provider Notes (Signed)
[facility] DEPARTMENT Provider Note   CSN: [id number] Arrival date & time: 04/16/20  0447     History Chief Complaint  Patient presents with  . Chest Pain    Tyler Sweeney is a 60 y.o. male.  HPI   Patient with significant medical history of CAD, polycystic kidney disease, CABG 07/1919 presents with chief complaint of substernal chest pain that has been coming going since Friday.  Patient endorses generally he has chest pain after exertion but last night he started to have chest pain while at rest.  He describes a sharp pressure-like sensation in the middle of his chest, does not radiate, is not associated with shortness of breath becoming diaphoretic, becoming nauseous or vomiting.  Patient denies leg swelling orthopnea.   He states he took a nitro at 430 this morning which helped with his pain.  He still having pain at this time rates it at a 1.  Denies alleviating factors.  Patient was recently seen by his cardiologist on 02/04 for concern for possible recurrent ischemia and is scheduled for a stress test.  Patient denies headaches, fevers, chills, shortness of breath, abdominal pain, nausea, vomiting, diarrhea, pedal edema.  Past Medical History:  Diagnosis Date  . Anginal pain ([place])   . Asthma    as a child  . CAD (coronary artery disease) 11/24/2012   11/24/12 Cath nl LM, 99%LAD post D1, 100% 1rst OM with collaterals, 40% circumflex, no RCA dz, s/p DES LAD & CFX/OM1 Dr. Tamala Julian    . Hyperlipidemia 11/24/2012  . MI, old 2014   Diagnosis based on left ventricular dysfunction seen on stress test  . Personal history of other diseases of digestive system   . Polycystic kidney disease    "dx'd but have never had any symptoms" (07/05/2013)    Patient Active Problem List   Diagnosis Date Noted  . Polycystic kidney disease   . Asthma   . Anginal pain ([place])   . Injury of left foot 08/14/2019  . 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 Renal insufficiency 02/13/2018  .  S/P appendectomy 09/19/2016  . Chest pain 07/05/2013  . Unstable angina ([place]) 07/05/2013  . Vertigo   . CAD S/P PCI 2014-2015 11/24/2012  . Dyslipidemia, goal LDL below 70 11/24/2012  . Hyperlipidemia 11/24/2012  . CAD (coronary artery disease) 11/24/2012  . MI, old 2014  . GERD (gastroesophageal reflux disease) 05/08/2010    Past Surgical History:  Procedure Laterality Date  . COLONOSCOPY  11/02/2011   Procedure: COLONOSCOPY;  Surgeon: Winfield Cunas., MD;  Location: [facility];  Service: Endoscopy;  Laterality: N/A;  . CORONARY ANGIOPLASTY  07/05/2013  . CORONARY ANGIOPLASTY WITH STENT PLACEMENT  11/24/2012   "3 stents"  . CORONARY ARTERY BYPASS GRAFT N/A 07/25/2019   Procedure: CORONARY ARTERY BYPASS GRAFTING (CABG) x4 WITH LEFT RADIAL ARTERY HARVEST.  LIMA TO LAD, RADIAL ARTERY TO OM1 AND OM2 SEQUENTIAL, SVG TO DIAGONAL.;  Surgeon: Melrose Nakayama, MD;  Location: [place];  Service: Open Heart Surgery;  Laterality: N/A;  . ENDOVEIN HARVEST OF GREATER SAPHENOUS VEIN Right 07/25/2019   Procedure: [place] Of Greater Saphenous Vein;  Surgeon: Melrose Nakayama, MD;  Location: [place];  Service: Open Heart Surgery;  Laterality: Right;  . FOOT SURGERY Left 1984   "cyst"  . INGUINAL HERNIA REPAIR Right 1962  . LAPAROSCOPIC APPENDECTOMY N/A 09/19/2016   Procedure: APPENDECTOMY LAPAROSCOPIC;  Surgeon: Ralene Ok, MD;  Location: [place];  Service:  General;  Laterality: N/A;  . LEFT HEART CATH AND CORONARY ANGIOGRAPHY N/A 07/24/2019   Procedure: LEFT HEART CATH AND CORONARY ANGIOGRAPHY;  Surgeon: Burnell Blanks, MD;  Location: [facility];  Service: Cardiovascular;  Laterality: N/A;  . LEFT HEART CATHETERIZATION WITH CORONARY ANGIOGRAM N/A 11/24/2012   Procedure: LEFT HEART CATHETERIZATION WITH CORONARY ANGIOGRAM;  Surgeon: Jacolyn Reedy, MD;  Location[facility] CATH LAB;  Service: Cardiovascular;  Laterality: N/A;  . LEFT HEART CATHETERIZATION WITH CORONARY  ANGIOGRAM N/A 07/05/2013   Procedure: LEFT HEART CATHETERIZATION WITH CORONARY ANGIOGRAM;  Surgeon: Jacolyn Reedy, MD;  Location: [facility] CATH LAB;  Service: Cardiovascular;  Laterality: N/A;  . PERCUTANEOUS CORONARY STENT INTERVENTION (PCI-S)  11/24/2012   Procedure: PERCUTANEOUS CORONARY STENT INTERVENTION (PCI-S);  Surgeon: Jacolyn Reedy, MD;  Location: [facility] CATH LAB;  Service: Cardiovascular;;  . PERCUTANEOUS CORONARY STENT INTERVENTION (PCI-S) Right 07/05/2013   Procedure: PERCUTANEOUS CORONARY STENT INTERVENTION (PCI-S);  Surgeon: Jacolyn Reedy, MD;  Location: [facility] CATH LAB;  Service: Cardiovascular;  Laterality: Right;  . TEE WITHOUT CARDIOVERSION N/A 07/25/2019   Procedure: TRANSESOPHAGEAL ECHOCARDIOGRAM (TEE);  Surgeon: Melrose Nakayama, MD;  Location: [place];  Service: Open Heart Surgery;  Laterality: N/A;       Family History  Problem Relation Age of Onset  . Coronary artery disease Other   . Heart attack Father 8  . Heart attack Mother 65  . Stroke Mother 59    Social History   Tobacco Use  . Smoking status: Never Smoker  . Smokeless tobacco: Never Used  Vaping Use  . Vaping Use: Never used  Substance Use Topics  . Alcohol use: No  . Drug use: No    Home Medications Prior to Admission medications   Medication Sig Start Date End Date Taking? Authorizing Provider  aspirin 81 MG chewable tablet Chew 1 tablet (81 mg total) by mouth daily. Patient taking differently: Chew 81 mg by mouth every evening. 11/25/12  Yes Skains, Thana Farr, MD  Cholecalciferol (VITAMIN D3 PO) Take 1 tablet by mouth every 7 (seven) days.   Yes [provider]  metoprolol succinate (TOPROL-XL) 50 MG 24 hr tablet TAKE 1 TABLET (50 MG TOTAL) BY MOUTH DAILY. TAKE WITH OR IMMEDIATELY FOLLOWING A MEAL. Patient taking differently: Take 50 mg by mouth every evening. 03/31/20  Yes Melrose Nakayama, MD  Multiple Vitamins-Minerals (ZINC PO) Take 1 tablet by mouth See admin instructions. Takes  every 3-4 days   Yes [provider]  nitroGLYCERIN (NITROSTAT) 0.4 MG SL tablet Place 1 tablet (0.4 mg total) under the tongue every 5 (five) minutes as needed for chest pain. 10/26/19  Yes Richardo Priest, MD  PRALUENT 75 MG/ML SOAJ Inject 75 mLs as directed every 14 (fourteen) days. 04/08/20  Yes [provider]  rosuvastatin (CRESTOR) 10 MG tablet Take 1 tablet (10 mg total) by mouth daily. Patient taking differently: Take 10 mg by mouth every evening. 08/14/19  Yes Kilroy, Lurena Joiner K, PA-C    Allergies    Shellfish allergy, Gluten meal, Milk-related compounds, and Ramipril  Review of Systems   Review of Systems  Constitutional: Negative for chills and fever.  HENT: Negative for congestion and sore throat.   Respiratory: Negative for shortness of breath.   Cardiovascular: Positive for chest pain. Negative for palpitations and leg swelling.  Gastrointestinal: Negative for abdominal pain, diarrhea, nausea and vomiting.  Genitourinary: Negative for enuresis.  Musculoskeletal: Negative for back pain.  Skin: Negative for rash.  Neurological: Negative for dizziness and headaches.  Hematological: Does not bruise/bleed easily.    Physical Exam Updated Vital Signs BP (!) 129/96   Pulse 65   Temp 97.6 F (36.4 C) (Oral)   Resp 16   SpO2 100%   Physical Exam Vitals and nursing note reviewed.  Constitutional:      General: He is not in acute distress.    Appearance: He is not ill-appearing.  HENT:     Head: Normocephalic and atraumatic.     Nose: No congestion.  Eyes:     Conjunctiva/sclera: Conjunctivae normal.  Cardiovascular:     Rate and Rhythm: Normal rate and regular rhythm.     Pulses: Normal pulses.     Heart sounds: No murmur heard. No friction rub. No gallop.   Pulmonary:     Effort: No respiratory distress.     Breath sounds: Rales present. No wheezing or rhonchi.     Comments: Patient has noted bibasilar rales in the left lower lung, no wheezing,  rhonchi, stridor present. Abdominal:     Palpations: Abdomen is soft.     Tenderness: There is no abdominal tenderness.  Musculoskeletal:     Right lower leg: No edema.     Left lower leg: No edema.     Comments: Patient is moving all 4 extremities at difficulty.  Patient's chest was visualized there is no gross abnormalities noted, he was nontender to palpation, no crepitus or deformities present.  Cannot reproduce chest pain with palpation  Skin:    General: Skin is warm and dry.  Neurological:     Mental Status: He is alert.  Psychiatric:        Mood and Affect: Mood normal.     ED Results / Procedures / Treatments   Labs (all labs ordered are listed, but only abnormal results are displayed) Labs Reviewed  BASIC METABOLIC PANEL - Abnormal; Notable for the following components:      Result Value   Creatinine, Ser 1.38 (*)    GFR, Estimated 59 (*)    All other components within normal limits  RESP PANEL BY RT-PCR (FLU A&B, COVID) ARPGX2  CBC  MAGNESIUM  TROPONIN I (HIGH SENSITIVITY)  TROPONIN I (HIGH SENSITIVITY)    EKG EKG Interpretation  Date/Time:  Wednesday April 16 2020 04:56:27 EST Ventricular Rate:  68 PR Interval:  192 QRS Duration: 86 QT Interval:  398 QTC Calculation: 423 R Axis:   74 Text Interpretation: Normal sinus rhythm Possible Anterior infarct , age undetermined Abnormal ECG When compared with ECG of 07/26/2019, No significant change was found Confirmed by Delora Fuel ([id number]) on 04/16/2020 5:09:22 AM   Radiology DG Chest 2 View  Result Date: 04/16/2020 CLINICAL DATA:  Chest pain EXAM: CHEST - 2 VIEW COMPARISON:  09/18/2019 FINDINGS: The cardiopericardial silhouette is within normal limits for size. Status post CABG. Stable blunting of the left costophrenic angle compatible with chronic tiny effusion or scarring. Lungs otherwise clear. The visualized bony structures of the thorax show no acute abnormality. IMPRESSION: No acute cardiopulmonary  findings. Stable tiny left effusion versus pleuroparenchymal scarring. Electronically Signed   By: Misty Stanley M.D.   On: 04/16/2020 05:57    Procedures Procedures   Medications Ordered in ED Medications  aspirin EC tablet 81 mg (has no administration in time range)  nitroGLYCERIN (NITROGLYN) 2 % ointment 1 inch (has no administration in time range)  sodium chloride flush (NS) 0.9 % injection 3 mL (has no administration in time  range)  sodium chloride flush (NS) 0.9 % injection 3 mL (has no administration in time range)  0.9 %  sodium chloride infusion (has no administration in time range)  aspirin chewable tablet 81 mg (has no administration in time range)  0.9% sodium chloride infusion (has no administration in time range)    Followed by  0.9% sodium chloride infusion (has no administration in time range)    ED Course  I have reviewed the triage vital signs and the nursing notes.  Pertinent labs & imaging results that were available during my care of the patient were reviewed by me and considered in my medical decision making (see chart for details).    MDM Rules/Calculators/A&P                          Initial impression-patient presents with chest pain, he is alert, does not appear in acute distress, vital signs reassuring.  Triage obtain basic lab work, will obtain second troponin, magnesium, and will consult with cardiology for further recommendations.  Work-up-patient has a heart score of 4.  CBC unremarkable.  BMP unremarkable from prior, creatinine 1.38 appears to be a baseline for patient.  First troponin is 8, second troponin is 7, magnesium 1.8. chest x-ray does not reveal any acute findings.  EKG sinus rhythm without signs of ischemia no ST elevation depression noted.  Consult due to patient's extensive cardiac history recent CABG 05 17 will consult with cardiology for further recommendations.  Spoke with Dr. Irish Lack of cardiology he will come down and assess the  patient and determine ultimate disposition.  Patient will be admitted to cardiology service for further evaluation.  They have ordered IV heparin, Nitropaste and will continue to monitor.  Reassessment patient is reassessed, continues to have chest pain, rates it a 2 out of 10, vital signs remained stable.  Patient was made aware that he will be staying here in the hospital for further evaluation.  Patient is agreeable to this.  Rule out- I have low suspicion for ACS as EKG was sinus rhythm without signs of ischemia, patient had a delta troponin.  Low suspicion for PE as patient denies pleuritic chest pain, shortness of breath, patient denies leg pain, no pedal edema noted on exam, vital signs reassuring.  Low suspicion for AAA or aortic dissection as history is atypical, patient has low risk factors.  Low suspicion for systemic infection as patient is nontoxic-appearing, vital signs reassuring, no obvious source infection noted on exam.    Plan-suspect patient suffering from unstable angina, and will need further observation.  Patient care will be transferred to admitting team.     Final Clinical Impression(s) / ED Diagnoses Final diagnoses:  Chest pain, unspecified type    Rx / DC Orders ED Discharge Orders    None       Marcello Fennel, PA-C 04/16/20 1137    Elnora Morrison, MD 04/18/20 1151

## 2020-04-16 NOTE — H&P (Addendum)
Cardiology Admission History and Physical:   Patient ID: Tyler Sweeney MRN: [id number]; DOB: 1960/04/26   Admission date: 04/16/2020  Primary Care Provider: Orpah Melter, MD [facility] Cardiologist: Shirlee More, MD  Chief Complaint: Chest tightness  Patient Profile:   Tyler Sweeney is a 60 y.o. male with CAD and hyperlipidemia presented to [facility], ER for evaluation of chest tightness consistent with unstable angina.  History of CAD with prior PCI to LAD and OM1 in 2014.  He had angioplasty of restenosis in the circumflex beyond the stent site in 2015. Admitted 07/2019 with NSTEMI. He underwent diagnostic catheterization and subsequent CABG x4 with an LIMA to LAD, left radial artery to OM1 OM 2 sequentially, and SVG to the diagonal on 07/25/2019.  Echocardiogram showed preserved LV function.   Follow-up at lipid clinic for statin intolerance.  Most recently seen by Dr. Bettina Gavia April 11, 2020.  Intermittent chest pain.  Recommended stress test, pending.   History of Present Illness:   Tyler Sweeney presented with worsening chest tightness.  Patient uses stationary bicycle 5 times per week.  After about 30 minutes on bicycle he gets chest tightness and needed to stop.  Symptoms resolved within 30 to 90 minutes.  Never required to take sublingual nitroglycerin.  Last Friday he had worsened symptoms at rest and felt like gas.  This morning around 2 AM he woke up from sleep with 7 out of 10 chest tightness on left side.  Associated with shortness of breath.  No nausea, vomiting, diaphoresis or radiation.  He took 1 sublingual nitroglycerin with improvement but chest tightness never resolved and came to ER for further evaluation.  Currently having 2 out of 10 left-sided chest tightness.  Symptoms similar to prior angina but less intensity.  High-sensitivity troponin negative x2.  Serum creatinine 1.38.  Respiratory panel negative for COVID and influenza.  Checks x-ray without acute  finding.  Past Medical History:  Diagnosis Date  . Anginal pain ([place])   . Asthma    as a child  . CAD (coronary artery disease) 11/24/2012   11/24/12 Cath nl LM, 99%LAD post D1, 100% 1rst OM with collaterals, 40% circumflex, no RCA dz, s/p DES LAD & CFX/OM1 Dr. Tamala Julian    . Hyperlipidemia 11/24/2012  . MI, old 2014   Diagnosis based on left ventricular dysfunction seen on stress test  . Personal history of other diseases of digestive system   . Polycystic kidney disease    "dx'd but have never had any symptoms" (07/05/2013)    Past Surgical History:  Procedure Laterality Date  . COLONOSCOPY  11/02/2011   Procedure: COLONOSCOPY;  Surgeon: Winfield Cunas., MD;  Location: [facility];  Service: Endoscopy;  Laterality: N/A;  . CORONARY ANGIOPLASTY  07/05/2013  . CORONARY ANGIOPLASTY WITH STENT PLACEMENT  11/24/2012   "3 stents"  . CORONARY ARTERY BYPASS GRAFT N/A 07/25/2019   Procedure: CORONARY ARTERY BYPASS GRAFTING (CABG) x4 WITH LEFT RADIAL ARTERY HARVEST.  LIMA TO LAD, RADIAL ARTERY TO OM1 AND OM2 SEQUENTIAL, SVG TO DIAGONAL.;  Surgeon: Melrose Nakayama, MD;  Location: [place];  Service: Open Heart Surgery;  Laterality: N/A;  . ENDOVEIN HARVEST OF GREATER SAPHENOUS VEIN Right 07/25/2019   Procedure: [place] Of Greater Saphenous Vein;  Surgeon: Melrose Nakayama, MD;  Location: [place];  Service: Open Heart Surgery;  Laterality: Right;  . FOOT SURGERY Left 1984   "cyst"  . INGUINAL HERNIA REPAIR Right 1962  . LAPAROSCOPIC APPENDECTOMY N/A 09/19/2016  Procedure: APPENDECTOMY LAPAROSCOPIC;  Surgeon: Ralene Ok, MD;  Location: [place];  Service: General;  Laterality: N/A;  . LEFT HEART CATH AND CORONARY ANGIOGRAPHY N/A 07/24/2019   Procedure: LEFT HEART CATH AND CORONARY ANGIOGRAPHY;  Surgeon: Burnell Blanks, MD;  Location: [place] [facility];  Service: Cardiovascular;  Laterality: N/A;  . LEFT HEART CATHETERIZATION WITH CORONARY ANGIOGRAM N/A 11/24/2012    Procedure: LEFT HEART CATHETERIZATION WITH CORONARY ANGIOGRAM;  Surgeon: Jacolyn Reedy, MD;  Location: [facility] CATH LAB;  Service: Cardiovascular;  Laterality: N/A;  . LEFT HEART CATHETERIZATION WITH CORONARY ANGIOGRAM N/A 07/05/2013   Procedure: LEFT HEART CATHETERIZATION WITH CORONARY ANGIOGRAM;  Surgeon: Jacolyn Reedy, MD;  Location: [facility] CATH LAB;  Service: Cardiovascular;  Laterality: N/A;  . PERCUTANEOUS CORONARY STENT INTERVENTION (PCI-S)  11/24/2012   Procedure: PERCUTANEOUS CORONARY STENT INTERVENTION (PCI-S);  Surgeon: Jacolyn Reedy, MD;  Location: [facility] CATH LAB;  Service: Cardiovascular;;  . PERCUTANEOUS CORONARY STENT INTERVENTION (PCI-S) Right 07/05/2013   Procedure: PERCUTANEOUS CORONARY STENT INTERVENTION (PCI-S);  Surgeon: Jacolyn Reedy, MD;  Location: [facility] CATH LAB;  Service: Cardiovascular;  Laterality: Right;  . TEE WITHOUT CARDIOVERSION N/A 07/25/2019   Procedure: TRANSESOPHAGEAL ECHOCARDIOGRAM (TEE);  Surgeon: Melrose Nakayama, MD;  Location: [place];  Service: Open Heart Surgery;  Laterality: N/A;     Medications Prior to Admission: Prior to Admission medications   Medication Sig Start Date End Date Taking? Authorizing Provider  aspirin 81 MG chewable tablet Chew 1 tablet (81 mg total) by mouth daily. Patient taking differently: Chew 81 mg by mouth every evening. 11/25/12  Yes Quadir Muns, Thana Farr, MD  Cholecalciferol (VITAMIN D3 PO) Take 1 tablet by mouth every 7 (seven) days.   Yes [provider]  metoprolol succinate (TOPROL-XL) 50 MG 24 hr tablet TAKE 1 TABLET (50 MG TOTAL) BY MOUTH DAILY. TAKE WITH OR IMMEDIATELY FOLLOWING A MEAL. Patient taking differently: Take 50 mg by mouth every evening. 03/31/20  Yes Melrose Nakayama, MD  Multiple Vitamins-Minerals (ZINC PO) Take 1 tablet by mouth See admin instructions. Takes every 3-4 days   Yes [provider]  nitroGLYCERIN (NITROSTAT) 0.4 MG SL tablet Place 1 tablet (0.4 mg total) under the tongue every 5  (five) minutes as needed for chest pain. 10/26/19  Yes Richardo Priest, MD  PRALUENT 75 MG/ML SOAJ Inject 75 mLs as directed every 14 (fourteen) days. 04/08/20  Yes [provider]  rosuvastatin (CRESTOR) 10 MG tablet Take 1 tablet (10 mg total) by mouth daily. Patient taking differently: Take 10 mg by mouth every evening. 08/14/19  Yes Erlene Quan, PA-C     Allergies:    Allergies  Allergen Reactions  . Shellfish Allergy Anaphylaxis  . Gluten Meal Other (See Comments)    "can't have", "get inside vibration and get weaker"  . Milk-Related Compounds Other (See Comments)    Stomach issues, "get weaker"  . Ramipril Other (See Comments)    Head spinning and heart racing    Social History:   Social History   Socioeconomic History  . Marital status: Married    Spouse name: Not on file  . Number of children: Not on file  . Years of education: Not on file  . Highest education level: Not on file  Occupational History  . 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 Smoking status: Never Smoker  . Smokeless tobacco: Never Used  Vaping Use  . Vaping Use: Never used  Substance and Sexual Activity  . Alcohol use: No  . Drug use: No  . Sexual activity: Yes  Other Topics Concern  . Not on file  Social History Narrative  . Not on file   Social Determinants of Health   Financial Resource Strain: Not on file  Food Insecurity: Not on file  Transportation Needs: Not on file  Physical Activity: Not on file  Stress: Not on file  Social Connections: Not on file  Intimate Partner Violence: Not on file    Family History:   The patient's family history includes Coronary artery disease in an other family member; Heart attack (age of onset: 71) in his mother; Heart attack (age of onset: 42) in his father; Stroke (age of onset: 40) in his mother.    ROS:  Please see the history of present illness.  All other ROS reviewed and negative.     Physical  Exam/Data:   Vitals:   04/16/20 0948 04/16/20 1000 04/16/20 1030 04/16/20 1100  BP: (!) 127/93 127/84 125/87 (!) 129/96  Pulse: 60 61 63 65  Resp: 15 15 (!) 7 16  Temp:      TempSrc:      SpO2: 100% 100% 99% 100%   No intake or output data in the 24 hours ending 04/16/20 1109 Last 3 Weights 04/11/2020 10/10/2019 09/18/2019  Weight (lbs) 188 lb 1.3 oz 185 lb 183 lb  Weight (kg) 85.313 kg 83.915 kg 83.008 kg     There is no height or weight on file to calculate BMI.  General:  Well nourished, well developed, in no acute distress HEENT: normal Lymph: no adenopathy Neck: no JVD Endocrine:  No thryomegaly Vascular: No carotid bruits; FA pulses 2+ bilaterally without bruits  Cardiac:  normal S1, S2; RRR; no murmur  Lungs:  clear to auscultation bilaterally, no wheezing, rhonchi or rales  Abd: soft, nontender, no hepatomegaly  Ext: no edema Musculoskeletal:  No deformities, BUE and BLE strength normal and equal Skin: warm and dry  Neuro:  CNs 2-12 intact, no focal abnormalities noted Psych:  Normal affect    EKG:  The ECG that was done today  was personally reviewed and demonstrates NSR  Relevant CV Studies:  Echo 07/24/2019   1. Left ventricular ejection fraction, by estimation, is 55 to 60%. The  left ventricle has normal function. The left ventricle has no regional  wall motion abnormalities. There is mild left ventricular hypertrophy.  Left ventricular diastolic parameters  are consistent with Grade I diastolic dysfunction (impaired relaxation).  2. Right ventricular systolic function is normal. The right ventricular  size is normal.  3. Moderate pleural effusion in the left lateral region.  4. The mitral valve is abnormal. Trivial mitral valve regurgitation.  5. The aortic valve is tricuspid. Aortic valve regurgitation is not  visualized.  6. The inferior vena cava is normal in size with greater than 50%  respiratory variability, suggesting right atrial pressure of 3  mmHg.   LEFT HEART CATH AND CORONARY ANGIOGRAPHY  07/24/2019    Conclusion    Prox RCA to Mid RCA lesion is 40% stenosed.  Ost Cx to Prox Cx lesion is 60% stenosed.  1st Mrg lesion is 70% stenosed.  Mid Cx lesion is 80% stenosed.  Ost LAD to Prox LAD lesion is 95% stenosed.  Mid LAD lesion is 20% stenosed.  2nd Diag lesion is 70% stenosed.  There is mild left ventricular systolic dysfunction.  LV end diastolic pressure is normal.  The left ventricular ejection fraction is 45-50% by visual estimate.  There is no mitral valve regurgitation.   1. Severe ostial LAD stenosis. This arises just beyond the ostial Circumflex stent that seems to extend into the left main.  Patent mid LAD stent with minimal restenosis. The moderate caliber Diagonal branch that arises from the mid LAD stented segment has moderately severe ostial stenosis.  2. The ostial/proximal Circumflex stented segment is patent with moderate stent restenosis. The stented segment of the ostial OM1 branch is patent with moderately severe stenosis.  The mid Circumflex has a severe stenosis just before the takeoff of OM2.  3. The RCA is a large, dominant vessel with mild mid stenosis.  4. Mild segmental LV systolic dysfunction.   Recommendations: He has progression of his CAD. The ostial LAD disease is complicated by the ostial Circumflex stent that extends back into the left main. The stented segment of the ostial/proximal Circumflex and OM has moderate restenosis. The mid Circumflex has severe de novo disease. The Diagonal is affected by the prior mid LAD stent. His anatomy and disease is not favorable for treatment with PCI as any attempt at stenting of the LAD would potentially compromise flow into the Circumflex system. I have reviewed his cath films with Dr. Tamala Julian. Will consult CT surgery for CABG. Resume IV Heparin 6 hours post sheath pull and continue IV NTG. Continue statin, beta blocker and ASA. Will admit to ICU  given mild chest pain.  Laboratory Data:  High Sensitivity Troponin:   Recent Labs  Lab 04/16/20 0517 04/16/20 0726  TROPONINIHS 8 7      Chemistry Recent Labs  Lab 04/11/20 0825 04/16/20 0517  NA 137 137  K 4.1 3.9  CL 103 102  CO2 20 24  GLUCOSE 128* 92  BUN 15 12  CREATININE 1.39* 1.38*  CALCIUM 9.0 9.2  GFRNONAA 55* 59*  GFRAA 63  --   ANIONGAP  --  11    Recent Labs  Lab 04/11/20 0825  PROT 6.7  ALBUMIN 4.2  AST 19  ALT 17  ALKPHOS 106  BILITOT 0.3   Hematology Recent Labs  Lab 04/16/20 0517  WBC 8.5  RBC 4.75  HGB 14.1  HCT 42.7  MCV 89.9  MCH 29.7  MCHC 33.0  RDW 13.3  PLT 261    Radiology/Studies:  DG Chest 2 View  Result Date: 04/16/2020 CLINICAL DATA:  Chest pain EXAM: CHEST - 2 VIEW COMPARISON:  09/18/2019 FINDINGS: The cardiopericardial silhouette is within normal limits for size. Status post CABG. Stable blunting of the left costophrenic angle compatible with chronic tiny effusion or scarring. Lungs otherwise clear. The visualized bony structures of the thorax show no acute abnormality. IMPRESSION: No acute cardiopulmonary findings. Stable tiny left effusion versus pleuroparenchymal scarring. Electronically Signed   By: Misty Stanley M.D.   On: 04/16/2020 05:57    Assessment and Plan:   1. Unstable angina with history of CAD s/p prior stenting and recent CABG 07/2019 -Few weeks history of exertional chest tightness relieving with rest.  Episode of angina at rest on Friday.  This morning he woke up from sleep with chest tightness.  Symptoms improved after sublingual nitroglycerin x1.  Still having 2 out of 10 chest tightness.  His symptoms consistent with unstable angina.  This is similar to prior cardiac intervention but less intense. -Enzymes negative.  EKG without acute ischemic changes. -Start IV heparin and nitro past -Continue aspirin 81 mg daily, Toprol-XL 50 and statin -  The patient understands that risks include but are not limited  to stroke (1 in 1000), death (1 in 33), kidney failure [usually temporary] (1 in 500), bleeding (1 in 200), allergic reaction [possibly serious] (1 in 200), and agrees to proceed.   2.  Hyperlipidemia -Statin intolerance but currently taking Crestor 10mg  qd and on Praluent   Risk Assessment/Risk Scores:  }   TIMI Risk Score for Unstable Angina or Non-ST Elevation MI:   The patient's TIMI risk score is 1, which indicates a 5% risk of all cause mortality, new or recurrent myocardial infarction or need for urgent revascularization in the next 14 days.{   Severity of Illness: The appropriate patient status for this patient is OBSERVATION. Observation status is judged to be reasonable and necessary in order to provide the required intensity of service to ensure the patient's safety. The patient's presenting symptoms, physical exam findings, and initial radiographic and laboratory data in the context of their medical condition is felt to place them at decreased risk for further clinical deterioration. Furthermore, it is anticipated that the patient will be medically stable for discharge from the hospital within 2 midnights of admission. The following factors support the patient status of observation.   " The patient's presenting symptoms include Chest tightness. " The physical exam findings include normal " The initial radiographic and laboratory data are stable.     For questions or updates, please contact [place] Please consult [email or web address] for contact info under     Jarrett Soho, PA  04/16/2020 11:09 AM   Personally seen and examined. Agree with above.   60 year old male with unstable angina prior bypass.  Exertional type symptoms previously while on bicycle.  This morning 2 AM chest discomfort.  Nitroglycerin improvement but not completely resolved.  Troponin has been negative.  Creatinine 1.38.  Negative Covid.  Chest x-ray personally reviewed no  abnormalities.  GEN: Well nourished, well developed, in no acute distress  HEENT: normal  Neck: no JVD, carotid bruits, or masses Cardiac: RRR; no murmurs, rubs, or gallops,no edema  Respiratory:  clear to auscultation bilaterally, normal work of breathing GI: soft, nontender, nondistended, + BS MS: no deformity or atrophy  Skin: warm and dry, no rash Neuro:  Alert and Oriented x 3, Strength and sensation are intact Psych: euthymic mood, full affect  Spoke with his wife as well. They are contemplating moving at some point, perhaps back to [place].  They live in [place]. He works for [employer].  Assessment and plan:  Unstable angina -Left heart catheterization.  Risks and benefits have been explained.  He is willing to proceed.  All questions answered. -IV heparin, aspirin, nitroglycerin, PCSK9 inhibitor, beta-blocker -Creatinine minimally elevated, continue to monitor.  CAD -LIMA to LAD, left radial artery to OM1 OM 2 sequentially, and SVG to the diagonal on 07/25/2019.Echocardiogram showed preserved LV function.   Candee Furbish, MD

## 2020-04-16 NOTE — ED Triage Notes (Signed)
Pt reports that he has been having CP on and off since Friday, took nitro with relief. Denies nausea or SOB, pain radiates to R arm

## 2020-04-16 NOTE — Interval H&P Note (Signed)
Cath Lab Visit (complete for each Cath Lab visit)  Clinical Evaluation Leading to the Procedure:   ACS: No.  Non-ACS:    Anginal Classification: CCS III  Anti-ischemic medical therapy: Maximal Therapy (2 or more classes of medications)  Non-Invasive Test Results: No non-invasive testing performed  Prior CABG: Previous CABG      History and Physical Interval Note:  04/16/2020 1:51 PM  Tyler Sweeney  has presented today for surgery, with the diagnosis of nstemi.  The various methods of treatment have been discussed with the patient and family. After consideration of risks, benefits and other options for treatment, the patient has consented to  Procedure(s): LEFT HEART CATH AND CORS/GRAFTS ANGIOGRAPHY (N/A) as a surgical intervention.  The patient's history has been reviewed, patient examined, no change in status, stable for surgery.  I have reviewed the patient's chart and labs.  Questions were answered to the patient's satisfaction.     Kathlyn [place]

## 2020-04-17 ENCOUNTER — Encounter (HOSPITAL_COMMUNITY): Payer: Self-pay | Admitting: Cardiovascular Disease

## 2020-04-17 DIAGNOSIS — I200 Unstable angina: Secondary | ICD-10-CM | POA: Diagnosis not present

## 2020-04-17 LAB — CBC
HCT: 37.7 % — ABNORMAL LOW (ref 39.0–52.0)
Hemoglobin: 13.2 g/dL (ref 13.0–17.0)
MCH: 31 pg (ref 26.0–34.0)
MCHC: 35 g/dL (ref 30.0–36.0)
MCV: 88.5 fL (ref 80.0–100.0)
Platelets: 224 10*3/uL (ref 150–400)
RBC: 4.26 MIL/uL (ref 4.22–5.81)
RDW: 13.6 % (ref 11.5–15.5)
WBC: 9 10*3/uL (ref 4.0–10.5)
nRBC: 0 % (ref 0.0–0.2)

## 2020-04-17 LAB — BASIC METABOLIC PANEL
Anion gap: 8 (ref 5–15)
BUN: 15 mg/dL (ref 6–20)
CO2: 21 mmol/L — ABNORMAL LOW (ref 22–32)
Calcium: 8.8 mg/dL — ABNORMAL LOW (ref 8.9–10.3)
Chloride: 106 mmol/L (ref 98–111)
Creatinine, Ser: 1.51 mg/dL — ABNORMAL HIGH (ref 0.61–1.24)
GFR, Estimated: 53 mL/min — ABNORMAL LOW (ref >60)
Glucose, Bld: 86 mg/dL (ref 70–99)
Potassium: 4 mmol/L (ref 3.5–5.1)
Sodium: 135 mmol/L (ref 135–145)

## 2020-04-17 MED ORDER — FAMOTIDINE 10 MG PO TABS: 10.0000 mg | ORAL_TABLET | Freq: Two times a day (BID) | ORAL | 0 refills | Status: DC

## 2020-04-17 MED ORDER — ISOSORBIDE MONONITRATE ER 30 MG PO TB24: 30.0000 mg | ORAL_TABLET | Freq: Every day | ORAL | 2 refills | Status: DC

## 2020-04-17 NOTE — Discharge Summary (Addendum)
Discharge Summary    Patient ID: Tyler Sweeney MRN: [id number]; DOB: 05-10-1960  Admit date: 04/16/2020 Discharge date: 04/17/2020  Primary Care Provider: Orpah Melter, MD  Primary Cardiologist: Shirlee More, MD  Primary Electrophysiologist:  None   Discharge Diagnoses    Principal Problem:   Unstable angina [facility]) Active Problems:   GERD (gastroesophageal reflux disease)   CAD S/P PCI 2014-2015   Dyslipidemia, goal LDL below 70   S/P CABG x 4    Diagnostic Studies/Procedures    Left Cardiac Catheterization 04/16/2020:  Ost LAD to Prox LAD lesion is 60% stenosed.  Mid LAD lesion is 20% stenosed.  2nd Diag lesion is 70% stenosed.  1st Mrg lesion is 60% stenosed.  Ost Cx to Prox Cx lesion is 50% stenosed.  Mid Cx lesion is 60% stenosed.  Prox RCA to Mid RCA lesion is 30% stenosed.  The left ventricular systolic function is normal.  LV end diastolic pressure is normal.  The left ventricular ejection fraction is 55-65% by visual estimate.  SVG and is normal in caliber.  The graft exhibits no disease.  Left radial artery.  Dist Graft lesion is 100% stenosed.  Mid Graft lesion is 100% stenosed.   1.  Significant underlying three-vessel coronary artery disease with atretic LIMA to LAD and radial artery graft to OM 2.  Patent SVG to diagonal which supplies retrograde flow to the LAD.  In addition, the ostial LAD stenosis that is caused by the ostial left circumflex stent does not seem to be obstructive given that there is very good antegrade flow.  The left circumflex itself has moderate nonobstructive disease due to in-stent restenosis as well as moderate stenosis distal to the stent.  2.  Normal LV systolic function and normal left ventricular end-diastolic pressure.  Recommendations: Suspect failure of LIMA to LAD and radial artery graft to OM is due to competitive flow from the native vessels as well as competitive flow to the LAD from the SVG to  diagonal. I do not see a need for revascularization at this point given that the disease in the left circumflex appears to be moderate and the LAD distribution is getting adequate antegrade flow as well as retrograde flow from the SVG to diagonal.  Recommend aggressive medical therapy.  Diagnostic Dominance: Right    _____________   History of Present Illness     Tyler Sweeney is a 60 y.o. male with a history of CAD s/p prior stenting to LAD and OM1 and then CABG x4 in 07/2019, hyperlipidemia, CKD stage III, and GERD who was admitted on 04/16/2020 with chest pain concerning for unstable angina.   Patient reported worsening chest tightness with activity. He uses a stationary bicycle 5 times per week and had noticed chest tightness after about 30 minutes on the bike. He would have stop and symptoms would resolve within 30-90 minutes. He never had to take any sublingual Nitro. On 04/11/2020, he had worsened symptoms at rest but stated it felt like gas. On day of presentation, he woke up from sleep with 7/10 chest pain on the left sided with associated shortness of breath. No nausea, vomiting, diaphoresis, or radiation of pain. He took one dose of sublingual Nitro with improvement but chest tightness never fully resolved so he came to the ED for further evaluation. Symptoms similar to prior angina but less intense.  EKG showed no acute changes. High-sensitivity troponin negative x2. Given concern for unstable, he was started on IV Heparin and admitted.  Hospital Course  Consultants: None.  Patient admitted with unstable angina as stated above. High-senstivity troponin remained negative. Patient underwent cardiac catheterization which showed significant underlying three vessel CAD with atretic LIMA to LAD and radial artery graft to OM2. SVG to Diag was patent and supplied retrograde flow to LAD. He ostial LAD stenosis caused by the ostial LCX stent did not seem to be obstructive and had very good  antegrade flow. It was felt that the failure of LIMA to LAD and radial artery graft to OM is due to competitive flow from the native vessels as well as competitive flow to the LAD from the SVG to Diagonal. There was not felt to be a need for revascularization. Patient tolerated procedure well with no recurrent chest pain. We will continue medical therapy. Imdur was started and we will discharge on 30mg  daily. Will also give a prescription of Pepcid for possible GERD component. Continue home aspirin, beta-blocker, and statin. Also on Praluent. Creatinine slightly up from today after cath at 1.51 compared to admission but still around baseline. Can recheck BMET at follow-up visit.  Patient seen and examined by Dr. Marlou Porch today and determined to be stable for discharge. Outpatient follow-up has been arranged. Medications as below.  Did the patient have an acute coronary syndrome (MI, NSTEMI, STEMI, etc) this admission?:  No                               Did the patient have a percutaneous coronary intervention (stent / angioplasty)?:  No.       _____________  Discharge Vitals Blood pressure (!) 108/91, pulse 68, temperature 98.6 F (37 C), temperature source Oral, resp. rate 17, height 5\' 11"  (1.803 m), weight 81.3 kg, SpO2 96 %.  Filed Weights   04/17/20 0556  Weight: 81.3 kg   General: 60 y.o. male resting comfortably in no acute distress. HEENT: Normocephalic and atraumatic.  Neck: Supple. No JVD. Heart: RRR. Distinct S1 and S2. No murmurs, gallops, or rubs. Right femoral cath site soft and stable with no signs of hematoma. Lungs: No increased work of breathing. Clear to ausculation bilaterally. No wheezes, rhonchi, or rales.  Abdomen: Soft, non-distended, and non-tender to palpation.  MSK: Normal strength and tone for age. Extremities: No lower extremity edema.    Skin: Warm and dry. Neuro: Alert and oriented x3. No focal deficits. Psych: Normal affect. Responds appropriately.  Labs &  Radiologic Studies    CBC Recent Labs    04/16/20 0517 04/17/20 0202  WBC 8.5 9.0  HGB 14.1 13.2  HCT 42.7 37.7*  MCV 89.9 88.5  PLT 261 720   Basic Metabolic Panel Recent Labs    04/16/20 0517 04/16/20 0726 04/17/20 0202  NA 137  --  135  K 3.9  --  4.0  CL 102  --  106  CO2 24  --  21*  GLUCOSE 92  --  86  BUN 12  --  15  CREATININE 1.38*  --  1.51*  CALCIUM 9.2  --  8.8*  MG  --  1.8  --    Liver Function Tests No results for input(s): AST, ALT, ALKPHOS, BILITOT, PROT, ALBUMIN in the last 72 hours. No results for input(s): LIPASE, AMYLASE in the last 72 hours. High Sensitivity Troponin:   Recent Labs  Lab 04/16/20 0517 04/16/20 0726  TROPONINIHS 8 7    BNP Invalid input(s): POCBNP D-Dimer No results for input(s): DDIMER in  the last 72 hours. Hemoglobin A1C No results for input(s): HGBA1C in the last 72 hours. Fasting Lipid Panel No results for input(s): CHOL, HDL, LDLCALC, TRIG, CHOLHDL, LDLDIRECT in the last 72 hours. Thyroid Function Tests No results for input(s): TSH, T4TOTAL, T3FREE, THYROIDAB in the last 72 hours.  Invalid input(s): FREET3 _____________  DG Chest 2 View  Result Date: 04/16/2020 CLINICAL DATA:  Chest pain EXAM: CHEST - 2 VIEW COMPARISON:  09/18/2019 FINDINGS: The cardiopericardial silhouette is within normal limits for size. Status post CABG. Stable blunting of the left costophrenic angle compatible with chronic tiny effusion or scarring. Lungs otherwise clear. The visualized bony structures of the thorax show no acute abnormality. IMPRESSION: No acute cardiopulmonary findings. Stable tiny left effusion versus pleuroparenchymal scarring. Electronically Signed   By: Misty Stanley M.D.   On: 04/16/2020 05:57   CARDIAC CATHETERIZATION  Result Date: 04/16/2020  Ost LAD to Prox LAD lesion is 60% stenosed.  Mid LAD lesion is 20% stenosed.  2nd Diag lesion is 70% stenosed.  1st Mrg lesion is 60% stenosed.  Ost Cx to Prox Cx lesion is 50%  stenosed.  Mid Cx lesion is 60% stenosed.  Prox RCA to Mid RCA lesion is 30% stenosed.  The left ventricular systolic function is normal.  LV end diastolic pressure is normal.  The left ventricular ejection fraction is 55-65% by visual estimate.  SVG and is normal in caliber.  The graft exhibits no disease.  Left radial artery.  Dist Graft lesion is 100% stenosed.  Mid Graft lesion is 100% stenosed.  1.  Significant underlying three-vessel coronary artery disease with atretic LIMA to LAD and radial artery graft to OM 2.  Patent SVG to diagonal which supplies retrograde flow to the LAD.  In addition, the ostial LAD stenosis that is caused by the ostial left circumflex stent does not seem to be obstructive given that there is very good antegrade flow.  The left circumflex itself has moderate nonobstructive disease due to in-stent restenosis as well as moderate stenosis distal to the stent. 2.  Normal LV systolic function and normal left ventricular end-diastolic pressure. Recommendations: Suspect failure of LIMA to LAD and radial artery graft to OM is due to competitive flow from the native vessels as well as competitive flow to the LAD from the SVG to diagonal. I do not see a need for revascularization at this point given that the disease in the left circumflex appears to be moderate and the LAD distribution is getting adequate antegrade flow as well as retrograde flow from the SVG to diagonal. Recommend aggressive medical therapy.   Disposition   Patient is being discharged home today in good condition.  Follow-up Plans & Appointments     Follow-up Information    Richardo Priest, MD Follow up.   Specialties: Cardiology, Radiology Why: Hospital follow-up scheduled for 05/07/2020 at 9:20am. Please arrive 15 minutes early for check-in. If this date/time does not work for you, please call our office to reschedule.  Contact information: [address] [place]  [id number] [phone]              Discharge Instructions    Diet - low sodium heart healthy   Complete by: As directed    Increase activity slowly   Complete by: As directed       Discharge Medications   Allergies as of 04/17/2020      Reactions   Shellfish Allergy Anaphylaxis   Gluten Meal  Other (See Comments)   "can't have", "get inside vibration and get weaker"   Milk-related Compounds Other (See Comments)   Stomach issues, "get weaker"   Ramipril Other (See Comments)   Head spinning and heart racing      Medication List    TAKE these medications   aspirin 81 MG chewable tablet Chew 1 tablet (81 mg total) by mouth daily. What changed: when to take this   famotidine 10 MG tablet Commonly known as: PEPCID Take 1 tablet (10 mg total) by mouth 2 (two) times daily.   isosorbide mononitrate 30 MG 24 hr tablet Commonly known as: IMDUR Take 1 tablet (30 mg total) by mouth daily.   metoprolol succinate 50 MG 24 hr tablet Commonly known as: TOPROL-XL TAKE 1 TABLET (50 MG TOTAL) BY MOUTH DAILY. TAKE WITH OR IMMEDIATELY FOLLOWING A MEAL. What changed:   when to take this  additional instructions   nitroGLYCERIN 0.4 MG SL tablet Commonly known as: NITROSTAT Place 1 tablet (0.4 mg total) under the tongue every 5 (five) minutes as needed for chest pain.   Praluent 75 MG/ML Soaj Generic drug: Alirocumab Inject 75 mLs as directed every 14 (fourteen) days.   rosuvastatin 10 MG tablet Commonly known as: CRESTOR Take 1 tablet (10 mg total) by mouth daily. What changed: when to take this   VITAMIN D3 PO Take 1 tablet by mouth every 7 (seven) days.   ZINC PO Take 1 tablet by mouth See admin instructions. Takes every 3-4 days          Outstanding Labs/Studies   Recheck BMET at follow-up.  Duration of Discharge Encounter   Greater than 30 minutes including physician time.  Signed, Darreld Mclean, PA-C 04/17/2020, 9:40 AM  Personally seen and  examined. Agree with above.  60 year old male with known coronary artery disease status post recent CABG who came in with unstable anginal type symptoms, normal troponin, no ischemic changes on ECG.  Been riding his bicycle and noting some chest discomfort relieved with rest.  We went ahead and performed cardiac catheterization as described above.  No PCI was performed.  LIMA to LAD was atretic and SVG to obtuse marginal was down however SVG to diagonal was wide open filling the LAD retrograde.  He had excellent antegrade flow, normal flow, down the LAD itself.  The proximal region did not appear to be flow-limiting.  We started him on isosorbide 30 mg here.  Also encouraged Pepcid.  Overall feels well catheterization site clean dry and intact.  Okay for discharge with close follow-up.  Candee Furbish, MD

## 2020-04-17 NOTE — Discharge Instructions (Signed)
Medication Changes: - START Imdur 30mg  daily. - We will also give you a prescription of Pepcid 10mg  twice daily to help reflux. You can get this over-the-counter as well if that is cheaper. - Continue all other home medications.  Post Cardiac Catheterization: NO HEAVY LIFTING OR SEXUAL ACTIVITY X 7 DAYS. NO DRIVING X 2-3 DAYS. NO SOAKING BATHS, HOT TUBS, POOLS, ETC., X 7 DAYS.  Groin Site Care: Refer to this sheet in the next few weeks. These instructions provide you with information on caring for yourself after your procedure. Your caregiver may also give you more specific instructions. Your treatment has been planned according to current medical practices, but problems sometimes occur. Call your caregiver if you have any problems or questions after your procedure. HOME CARE INSTRUCTIONS  You may shower 24 hours after the procedure. Remove the bandage (dressing) and gently wash the site with plain soap and water. Gently pat the site dry.   Do not apply powder or lotion to the site.   Do not sit in a bathtub, swimming pool, or whirlpool for 5 to 7 days.   No bending, squatting, or lifting anything over 10 pounds (4.5 kg) as directed by your caregiver.   Inspect the site at least twice daily.   Do not drive home if you are discharged the same day of the procedure. Have someone else drive you.  What to expect:  Any bruising will usually fade within 1 to 2 weeks.   Blood that collects in the tissue (hematoma) may be painful to the touch. It should usually decrease in size and tenderness within 1 to 2 weeks.  SEEK IMMEDIATE MEDICAL CARE IF:  You have unusual pain at the groin site or down the affected leg.   You have redness, warmth, swelling, or pain at the groin site.   You have drainage (other than a small amount of blood on the dressing).   You have chills.   You have a fever or persistent symptoms for more than 72 hours.   You have a fever and your symptoms suddenly get  worse.   Your leg becomes pale, cool, tingly, or numb.   You have heavy bleeding from the site. Hold pressure on the site.

## 2020-04-25 ENCOUNTER — Telehealth: Payer: Self-pay | Admitting: Cardiology

## 2020-04-25 ENCOUNTER — Ambulatory Visit (INDEPENDENT_AMBULATORY_CARE_PROVIDER_SITE_OTHER): Payer: Commercial Managed Care - PPO | Admitting: Cardiology

## 2020-04-25 ENCOUNTER — Encounter: Payer: Self-pay | Admitting: Cardiology

## 2020-04-25 ENCOUNTER — Other Ambulatory Visit (HOSPITAL_COMMUNITY): Payer: Commercial Managed Care - PPO

## 2020-04-25 ENCOUNTER — Other Ambulatory Visit: Payer: Self-pay

## 2020-04-25 VITALS — BP 120/90 | HR 74 | Ht 71.0 in | Wt 190.0 lb

## 2020-04-25 DIAGNOSIS — I2510 Atherosclerotic heart disease of native coronary artery without angina pectoris: Secondary | ICD-10-CM

## 2020-04-25 DIAGNOSIS — R1031 Right lower quadrant pain: Secondary | ICD-10-CM | POA: Diagnosis not present

## 2020-04-25 NOTE — Telephone Encounter (Signed)
Tyler Sweeney is calling requesting to speak with a nurse due to having some pain at the sight of his incision from his recent procedure. Please advise.

## 2020-04-25 NOTE — Telephone Encounter (Signed)
Left message on patients voicemail to please return our call.   

## 2020-04-25 NOTE — Progress Notes (Signed)
Cardiology Office Note:    Date:  04/26/2020   ID:  Tyler Sweeney, DOB 06-11-1960, MRN [id number]  PCP:  Orpah Melter, MD  Cardiologist:  Shirlee More, MD  Electrophysiologist:  None   Referring MD: Orpah Melter, MD   Chief Complaint  Patient presents with  . Follow-up    Cath site    History of Present Illness:    Tyler Sweeney is a 60 y.o. male with a hx of  CAD polycystic kidney disease hyperlipidemia with elevated lipoprotein a level on a statin and had CABG 07/25/2019.  He recently underwent left heart catheterization which was through his right groin at the time no PCI was performed.  He requested to be seen today because he has been having pain in the right groin.  He reports that the pain is mostly when he lies down at night to sleep.   He notes that this has been going on since Monday.   He usually follows with Dr. Bettina Gavia and requested to be seen today.  Since I am  in [place] today Dr. Bettina Gavia asked me to see the patient.   Past Medical History:  Diagnosis Date  . Anginal pain ([place])   . Asthma    as a child  . CAD (coronary artery disease) 11/24/2012   11/24/12 Cath nl LM, 99%LAD post D1, 100% 1rst OM with collaterals, 40% circumflex, no RCA dz, s/p DES LAD & CFX/OM1 Dr. Tamala Julian    . Hyperlipidemia 11/24/2012  . MI, old 2014   Diagnosis based on left ventricular dysfunction seen on stress test  . Personal history of other diseases of digestive system   . Polycystic kidney disease    "dx'd but have never had any symptoms" (07/05/2013)    Past Surgical History:  Procedure Laterality Date  . CARDIAC CATHETERIZATION  04/16/2020  . COLONOSCOPY  11/02/2011   Procedure: COLONOSCOPY;  Surgeon: Winfield Cunas., MD;  Location: [facility];  Service: Endoscopy;  Laterality: N/A;  . CORONARY ANGIOPLASTY  07/05/2013  . CORONARY ANGIOPLASTY WITH STENT PLACEMENT  11/24/2012   "3 stents"  . CORONARY ARTERY BYPASS GRAFT N/A 07/25/2019   Procedure: CORONARY ARTERY BYPASS GRAFTING  (CABG) x4 WITH LEFT RADIAL ARTERY HARVEST.  LIMA TO LAD, RADIAL ARTERY TO OM1 AND OM2 SEQUENTIAL, SVG TO DIAGONAL.;  Surgeon: Melrose Nakayama, MD;  Location: [place];  Service: Open Heart Surgery;  Laterality: N/A;  . ENDOVEIN HARVEST OF GREATER SAPHENOUS VEIN Right 07/25/2019   Procedure: [place] Of Greater Saphenous Vein;  Surgeon: Melrose Nakayama, MD;  Location: [place];  Service: Open Heart Surgery;  Laterality: Right;  . FOOT SURGERY Left 1984   "cyst"  . INGUINAL HERNIA REPAIR Right 1962  . LAPAROSCOPIC APPENDECTOMY N/A 09/19/2016   Procedure: APPENDECTOMY LAPAROSCOPIC;  Surgeon: Ralene Ok, MD;  Location: [place];  Service: General;  Laterality: N/A;  . LEFT HEART CATH AND CORONARY ANGIOGRAPHY N/A 07/24/2019   Procedure: LEFT HEART CATH AND CORONARY ANGIOGRAPHY;  Surgeon: Burnell Blanks, MD;  Location: [facility];  Service: Cardiovascular;  Laterality: N/A;  . LEFT HEART CATH AND CORS/GRAFTS ANGIOGRAPHY N/A 04/16/2020   Procedure: LEFT HEART CATH AND CORS/GRAFTS ANGIOGRAPHY;  Surgeon: Wellington Hampshire, MD;  Location: [facility];  Service: Cardiovascular;  Laterality: N/A;  . LEFT HEART CATHETERIZATION WITH CORONARY ANGIOGRAM N/A 11/24/2012   Procedure: LEFT HEART CATHETERIZATION WITH CORONARY ANGIOGRAM;  Surgeon: Jacolyn Reedy, MD;  Location: [facility];  Service: Cardiovascular;  Laterality: N/A;  . LEFT HEART CATHETERIZATION WITH CORONARY ANGIOGRAM N/A 07/05/2013   Procedure: LEFT HEART CATHETERIZATION WITH CORONARY ANGIOGRAM;  Surgeon: Jacolyn Reedy, MD;  Location: [facility] CATH LAB;  Service: Cardiovascular;  Laterality: N/A;  . PERCUTANEOUS CORONARY STENT INTERVENTION (PCI-S)  11/24/2012   Procedure: PERCUTANEOUS CORONARY STENT INTERVENTION (PCI-S);  Surgeon: Jacolyn Reedy, MD;  Location: [facility] C[facility] LAB;  Service: Cardiovascular;;  . PERCUTANEOUS CORONARY STENT INTERVENTION (PCI-S) Right 07/05/2013   Procedure: PERCUTANEOUS CORONARY STENT  INTERVENTION (PCI-S);  Surgeon: Jacolyn Reedy, MD;  Location: [facility] CATH LAB;  Service: Cardiovascular;  Laterality: Right;  . TEE WITHOUT CARDIOVERSION N/A 07/25/2019   Procedure: TRANSESOPHAGEAL ECHOCARDIOGRAM (TEE);  Surgeon: Melrose Nakayama, MD;  Location: [place];  Service: Open Heart Surgery;  Laterality: N/A;    Current Medications: Current Meds  Medication Sig  . aspirin 81 MG chewable tablet Chew 1 tablet (81 mg total) by mouth daily.  . Cholecalciferol (VITAMIN D3 PO) Take 1 tablet by mouth every 7 (seven) days.  . famotidine (PEPCID) 10 MG tablet Take 1 tablet (10 mg total) by mouth 2 (two) times daily.  . isosorbide mononitrate (IMDUR) 30 MG 24 hr tablet Take 1 tablet (30 mg total) by mouth daily.  . metoprolol succinate (TOPROL-XL) 50 MG 24 hr tablet TAKE 1 TABLET (50 MG TOTAL) BY MOUTH DAILY. TAKE WITH OR IMMEDIATELY FOLLOWING A MEAL.  . Multiple Vitamins-Minerals (ZINC PO) Take 1 tablet by mouth See admin instructions. Takes every 3-4 days  . nitroGLYCERIN (NITROSTAT) 0.4 MG SL tablet Place 1 tablet (0.4 mg total) under the tongue every 5 (five) minutes as needed for chest pain.  Marland Kitchen PRALUENT 75 MG/ML SOAJ Inject 75 mLs as directed every 14 (fourteen) days.  . rosuvastatin (CRESTOR) 10 MG tablet Take 1 tablet (10 mg total) by mouth daily.     Allergies:   Shellfish allergy, Gluten meal, Milk-related compounds, and Ramipril   Social History   Socioeconomic History  . Marital status: Married    Spouse name: Not on file  . Number of children: Not on file  . Years of education: Not on file  . Highest education level: Not on file  Occupational History  . 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 Smoking status: Never Smoker  . Smokeless tobacco: Never Used  Vaping Use  . Vaping Use: Never used  Substance and Sexual Activity  . Alcohol use: No  . Drug use: No  . Sexual activity: Yes  Other Topics Concern  . Not on file  Social History  Narrative  . Not on file   Social Determinants of Health   Financial Resource Strain: Not on file  Food Insecurity: Not on file  Transportation Needs: Not on file  Physical Activity: Not on file  Stress: Not on file  Social Connections: Not on file     Family History: The patient's family history includes Coronary artery disease in an other family member; Heart attack (age of onset: 4) in his mother; Heart attack (age of onset: 15) in his father; Stroke (age of onset: 64) in his mother.  ROS:   Review of Systems  Constitution: Negative for decreased appetite, fever and weight gain.  HENT: Negative for congestion, ear discharge, hoarse voice and sore throat.   Eyes: Negative for discharge, redness, vision loss in right eye and visual halos.  Cardiovascular: Negative for chest pain, dyspnea on exertion, leg swelling, orthopnea and palpitations.  Respiratory:  Negative for cough, hemoptysis, shortness of breath and snoring.   Endocrine: Negative for heat intolerance and polyphagia.  Hematologic/Lymphatic: Negative for bleeding problem. Does not bruise/bleed easily.  Skin: Negative for flushing, nail changes, rash and suspicious lesions.  Musculoskeletal: Negative for arthritis, joint pain, muscle cramps, myalgias, neck pain and stiffness.  Gastrointestinal: Negative for abdominal pain, bowel incontinence, diarrhea and excessive appetite.  Genitourinary: Negative for decreased libido, genital sores and incomplete emptying.  Neurological: Negative for brief paralysis, focal weakness, headaches and loss of balance.  Psychiatric/Behavioral: Negative for altered mental status, depression and suicidal ideas.  Allergic/Immunologic: Negative for HIV exposure and persistent infections.  Right groin pain.  EKGs/Labs/Other Studies Reviewed:    The following studies were reviewed today:   EKG:  None today  LHC 04/16/2020  Ost LAD to Prox LAD lesion is 60% stenosed.  Mid LAD lesion is 20%  stenosed.  2nd Diag lesion is 70% stenosed.  1st Mrg lesion is 60% stenosed.  Ost Cx to Prox Cx lesion is 50% stenosed.  Mid Cx lesion is 60% stenosed.  Prox RCA to Mid RCA lesion is 30% stenosed.  The left ventricular systolic function is normal.  LV end diastolic pressure is normal.  The left ventricular ejection fraction is 55-65% by visual estimate.  SVG and is normal in caliber.  The graft exhibits no disease.  Left radial artery.  Dist Graft lesion is 100% stenosed.  Mid Graft lesion is 100% stenosed.   1.  Significant underlying three-vessel coronary artery disease with atretic LIMA to LAD and radial artery graft to OM 2.  Patent SVG to diagonal which supplies retrograde flow to the LAD.  In addition, the ostial LAD stenosis that is caused by the ostial left circumflex stent does not seem to be obstructive given that there is very good antegrade flow.  The left circumflex itself has moderate nonobstructive disease due to in-stent restenosis as well as moderate stenosis distal to the stent.  2.  Normal LV systolic function and normal left ventricular end-diastolic pressure.  Recommendations: Suspect failure of LIMA to LAD and radial artery graft to OM is due to competitive flow from the native vessels as well as competitive flow to the LAD from the SVG to diagonal. I do not see a need for revascularization at this point given that the disease in the left circumflex appears to be moderate and the LAD distribution is getting adequate antegrade flow as well as retrograde flow from the SVG to diagonal.  Recommend aggressive medical therapy.    Recent Labs: 07/24/2019: TSH 1.279 04/11/2020: ALT 17 04/16/2020: Magnesium 1.8 04/17/2020: BUN 15; Creatinine, Ser 1.51; Hemoglobin 13.2; Platelets 224; Potassium 4.0; Sodium 135  Recent Lipid Panel    Component Value Date/Time   CHOL 89 (L) 04/11/2020 0825   TRIG 143 04/11/2020 0825   HDL 41 04/11/2020 0825   CHOLHDL 2.2  04/11/2020 0825   CHOLHDL 2.3 07/24/2019 1946   VLDL 15 07/24/2019 1946   LDLCALC 24 04/11/2020 0825    Physical Exam:    VS:  BP 120/90 (BP Location: Right Arm, Patient Position: Sitting, Cuff Size: Normal)   Pulse 74   Ht 5\' 11"  (1.803 m)   Wt 190 lb (86.2 kg)   SpO2 99%   BMI 26.50 kg/m     Wt Readings from Last 3 Encounters:  04/25/20 190 lb (86.2 kg)  04/17/20 179 lb 3.2 oz (81.3 kg)  04/11/20 188 lb 1.3 oz (85.3 kg)  GEN: Well nourished, well developed in no acute distress HEENT: Normal NECK: No JVD; No carotid bruits LYMPHATICS: No lymphadenopathy CARDIAC: S1S2 noted,RRR, no murmurs, rubs, gallops RESPIRATORY:  Clear to auscultation without rales, wheezing or rhonchi  ABDOMEN: Soft, non-tender, non-distended, +bowel sounds, no guarding. EXTREMITIES: No edema, No cyanosis, no clubbing MUSCULOSKELETAL:  No deformity  SKIN: Warm and dry NEUROLOGIC:  Alert and oriented x 3, non-focal PSYCHIATRIC:  Normal affect, good insight Right  Groin: Femoral arterial right groin suspected for a small pseudoaneurysm   ASSESSMENT:    1. Right groin pain   2. Coronary artery disease involving native coronary artery of native heart without angina pectoris    PLAN:    Suspect that there is a small pseudoaneurysm.  I will order a right femoral arterial Doppler to investigate this. I have  asked the patient to use Tylenol for pain.  There are no significant blood clots and no evidence of any compartment syndrome. I encouraged the patient and if the symptoms of pain worsens to notify the office.  In terms of his coronary artery disease he has no angina symptoms at this time. No medications changes at this time.  The patient is in agreement with the above plan. The patient left the office in stable condition.  The patient will follow up in 4 weeks with Dr. Bettina Gavia.   Medication Adjustments/Labs and Tests Ordered: Current medicines are reviewed at length with the patient today.   Concerns regarding medicines are outlined above.  Orders Placed This Encounter  Procedures  . VAS [place] LOWER EXTREMITY ARTERIAL DUPLEX   No orders of the defined types were placed in this encounter.   Patient Instructions  Medication Instructions:  Your physician recommends that you continue on your current medications as directed. Please refer to the Current Medication list given to you today.  *If you need a refill on your cardiac medications before your next appointment, please call your pharmacy*   Lab Work: None If you have labs (blood work) drawn today and your tests are completely normal, you will receive your results only by: Marland Kitchen MyChart Message (if you have MyChart) OR . A paper copy in the mail If you have any lab test that is abnormal or we need to change your treatment, we will call you to review the results.   Testing/Procedures: Your physician has requested that you have a lower extremity arterial duplex. This test is an ultrasound of the arteries in the legs. It looks at arterial blood flow in the legs. Allow one hour for Lower and Upper Arterial scans. There are no restrictions or special instructions    Follow-Up: At [facility], you and your health needs are our priority.  As part of our continuing mission to provide you with exceptional heart care, we have created designated Provider Care Teams.  These Care Teams include your primary Cardiologist (physician) and Advanced Practice Providers (APPs -  Physician Assistants and Nurse Practitioners) who all work together to provide you with the care you need, when you need it.  We recommend signing up for the patient portal called "MyChart".  Sign up information is provided on this After Visit Summary.  MyChart is used to connect with patients for Virtual Visits (Telemedicine).  Patients are able to view lab/test results, encounter notes, upcoming appointments, etc.  Non-urgent messages can be sent to your provider as well.    To learn more about what you can do with MyChart, go to [email or web address].  Your next appointment:   4 week(s)  The format for your next appointment:   In Person  Provider:   Shirlee More, MD   Other Instructions     Adopting a Healthy Lifestyle.  Know what a healthy weight is for you (roughly BMI <25) and aim to maintain this   Aim for 7+ servings of fruits and vegetables daily   65-80+ fluid ounces of water or unsweet tea for healthy kidneys   Limit to max 1 drink of alcohol per day; avoid smoking/tobacco   Limit animal fats in diet for cholesterol and heart health - choose grass fed whenever available   Avoid highly processed foods, and foods high in saturated/trans fats   Aim for low stress - take time to unwind and care for your mental health   Aim for 150 min of moderate intensity exercise weekly for heart health, and weights twice weekly for bone health   Aim for 7-9 hours of sleep daily   When it comes to diets, agreement about the perfect plan isnt easy to find, even among the experts. Experts at the [place] developed an idea known as the Healthy Eating Plate. Just imagine a plate divided into logical, healthy portions.   The emphasis is on diet quality:   Load up on vegetables and fruits - one-half of your plate: Aim for color and variety, and remember that potatoes dont count.   Go for whole grains - one-quarter of your plate: Whole wheat, barley, wheat berries, quinoa, oats, brown rice, and foods made with them. If you want pasta, go with whole wheat pasta.   Protein power - one-quarter of your plate: Fish, chicken, beans, and nuts are all healthy, versatile protein sources. Limit red meat.   The diet, however, does go beyond the plate, offering a few other suggestions.   Use healthy plant oils, such as olive, canola, soy, corn, sunflower and peanut. Check the labels, and avoid partially hydrogenated oil, which have  unhealthy trans fats.   If youre thirsty, drink water. Coffee and tea are good in moderation, but skip sugary drinks and limit milk and dairy products to one or two daily servings.   The type of carbohydrate in the diet is more important than the amount. Some sources of carbohydrates, such as vegetables, fruits, whole grains, and beans-are healthier than others.   Finally, stay active  Signed, Berniece Salines, DO  04/26/2020 12:31 AM    [facility]

## 2020-04-25 NOTE — Progress Notes (Incomplete)
Cardiology Office Note:    Date:  04/25/2020   ID:  Tyler Sweeney, DOB 06/27/1960, MRN [id number]  PCP:  Orpah Melter, MD  Cardiologist:  Shirlee More, MD  Electrophysiologist:  None   Referring MD: Orpah Melter, MD   Chief Complaint  Patient presents with  . Follow-up    Cath site    History of Present Illness:    Tyler Sweeney is a 60 y.o. male with a hx of  CAD polycystic kidney disease hyperlipidemia with elevated lipoprotein a level on a statin and had CABG 07/25/2019.  He recently underwent left heart catheterization which was through his right groin at the time no PCI was performed.  He requested to be seen today because he has been having pain in the right groin.  He reports that the pain is mostly when he lies down at night to sleep.  During the day he feels  Past Medical History:  Diagnosis Date  . Anginal pain ([place])   . Asthma    as a child  . CAD (coronary artery disease) 11/24/2012   11/24/12 Cath nl LM, 99%LAD post D1, 100% 1rst OM with collaterals, 40% circumflex, no RCA dz, s/p DES LAD & CFX/OM1 Dr. Tamala Julian    . Hyperlipidemia 11/24/2012  . MI, old 2014   Diagnosis based on left ventricular dysfunction seen on stress test  . Personal history of other diseases of digestive system   . Polycystic kidney disease    "dx'd but have never had any symptoms" (07/05/2013)    Past Surgical History:  Procedure Laterality Date  . CARDIAC CATHETERIZATION  04/16/2020  . COLONOSCOPY  11/02/2011   Procedure: COLONOSCOPY;  Surgeon: Winfield Cunas., MD;  Location: [facility];  Service: Endoscopy;  Laterality: N/A;  . CORONARY ANGIOPLASTY  07/05/2013  . CORONARY ANGIOPLASTY WITH STENT PLACEMENT  11/24/2012   "3 stents"  . CORONARY ARTERY BYPASS GRAFT N/A 07/25/2019   Procedure: CORONARY ARTERY BYPASS GRAFTING (CABG) x4 WITH LEFT RADIAL ARTERY HARVEST.  LIMA TO LAD, RADIAL ARTERY TO OM1 AND OM2 SEQUENTIAL, SVG TO DIAGONAL.;  Surgeon: Melrose Nakayama, MD;  Location:  [facility];  Service: Open Heart Surgery;  Laterality: N/A;  . ENDOVEIN HARVEST OF GREATER SAPHENOUS VEIN Right 07/25/2019   Procedure: [place] Of Greater Saphenous Vein;  Surgeon: Melrose Nakayama, MD;  Location: [place];  Service: Open Heart Surgery;  Laterality: Right;  . FOOT SURGERY Left 1984   "cyst"  . INGUINAL HERNIA REPAIR Right 1962  . LAPAROSCOPIC APPENDECTOMY N/A 09/19/2016   Procedure: APPENDECTOMY LAPAROSCOPIC;  Surgeon: Ralene Ok, MD;  Location: [place];  Service: General;  Laterality: N/A;  . LEFT HEART CATH AND CORONARY ANGIOGRAPHY N/A 07/24/2019   Procedure: LEFT HEART CATH AND CORONARY ANGIOGRAPHY;  Surgeon: Burnell Blanks, MD;  Location: [facility];  Service: Cardiovascular;  Laterality: N/A;  . LEFT HEART CATH AND CORS/GRAFTS ANGIOGRAPHY N/A 04/16/2020   Procedure: LEFT HEART CATH AND CORS/GRAFTS ANGIOGRAPHY;  Surgeon: Wellington Hampshire, MD;  Location: [place] [facility];  Service: Cardiovascular;  Laterality: N/A;  . LEFT HEART CATHETERIZATION WITH CORONARY ANGIOGRAM N/A 11/24/2012   Procedure: LEFT HEART CATHETERIZATION WITH CORONARY ANGIOGRAM;  Surgeon: Jacolyn Reedy, MD;  Location: [facility];  Service: Cardiovascular;  Laterality: N/A;  . LEFT HEART CATHETERIZATION WITH CORONARY ANGIOGRAM N/A 07/05/2013   Procedure: LEFT HEART CATHETERIZATION WITH CORONARY ANGIOGRAM;  Surgeon: Jacolyn Reedy, MD;  Location: [facility] CATH LAB;  Service: Cardiovascular;  Laterality: N/A;  . PERCUTANEOUS CORONARY STENT INTERVENTION (PCI-S)  11/24/2012   Procedure: PERCUTANEOUS CORONARY STENT INTERVENTION (PCI-S);  Surgeon: Jacolyn Reedy, MD;  Location: [facility] CATH LAB;  Service: Cardiovascular;;  . PERCUTANEOUS CORONARY STENT INTERVENTION (PCI-S) Right 07/05/2013   Procedure: PERCUTANEOUS CORONARY STENT INTERVENTION (PCI-S);  Surgeon: Jacolyn Reedy, MD;  Location: [facility] CATH LAB;  Service: Cardiovascular;  Laterality: Right;  . TEE WITHOUT CARDIOVERSION N/A 07/25/2019    Procedure: TRANSESOPHAGEAL ECHOCARDIOGRAM (TEE);  Surgeon: Melrose Nakayama, MD;  Location: [place];  Service: Open Heart Surgery;  Laterality: N/A;    Current Medications: Current Meds  Medication Sig  . aspirin 81 MG chewable tablet Chew 1 tablet (81 mg total) by mouth daily.  . Cholecalciferol (VITAMIN D3 PO) Take 1 tablet by mouth every 7 (seven) days.  . famotidine (PEPCID) 10 MG tablet Take 1 tablet (10 mg total) by mouth 2 (two) times daily.  . isosorbide mononitrate (IMDUR) 30 MG 24 hr tablet Take 1 tablet (30 mg total) by mouth daily.  . metoprolol succinate (TOPROL-XL) 50 MG 24 hr tablet TAKE 1 TABLET (50 MG TOTAL) BY MOUTH DAILY. TAKE WITH OR IMMEDIATELY FOLLOWING A MEAL.  . Multiple Vitamins-Minerals (ZINC PO) Take 1 tablet by mouth See admin instructions. Takes every 3-4 days  . nitroGLYCERIN (NITROSTAT) 0.4 MG SL tablet Place 1 tablet (0.4 mg total) under the tongue every 5 (five) minutes as needed for chest pain.  Marland Kitchen PRALUENT 75 MG/ML SOAJ Inject 75 mLs as directed every 14 (fourteen) days.  . rosuvastatin (CRESTOR) 10 MG tablet Take 1 tablet (10 mg total) by mouth daily.     Allergies:   Shellfish allergy, Gluten meal, Milk-related compounds, and Ramipril   Social History   Socioeconomic History  . Marital status: Married    Spouse name: Not on file  . Number of children: Not on file  . Years of education: Not on file  . Highest education level: Not on file  Occupational History  . 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 Smoking status: Never Smoker  . Smokeless tobacco: Never Used  Vaping Use  . Vaping Use: Never used  Substance and Sexual Activity  . Alcohol use: No  . Drug use: No  . Sexual activity: Yes  Other Topics Concern  . Not on file  Social History Narrative  . Not on file   Social Determinants of Health   Financial Resource Strain: Not on file  Food Insecurity: Not on file  Transportation Needs: Not on file   Physical Activity: Not on file  Stress: Not on file  Social Connections: Not on file     Family History: The patient's family history includes Coronary artery disease in an other family member; Heart attack (age of onset: 31) in his mother; Heart attack (age of onset: 27) in his father; Stroke (age of onset: 28) in his mother.  ROS:   Review of Systems  Constitution: Negative for decreased appetite, fever and weight gain.  HENT: Negative for congestion, ear discharge, hoarse voice and sore throat.   Eyes: Negative for discharge, redness, vision loss in right eye and visual halos.  Cardiovascular: Negative for chest pain, dyspnea on exertion, leg swelling, orthopnea and palpitations.  Respiratory: Negative for cough, hemoptysis, shortness of breath and snoring.   Endocrine: Negative for heat intolerance and polyphagia.  Hematologic/Lymphatic: Negative for bleeding problem. Does not bruise/bleed easily.  Skin: Negative for flushing, nail changes, rash  and suspicious lesions.  Musculoskeletal: Negative for arthritis, joint pain, muscle cramps, myalgias, neck pain and stiffness.  Gastrointestinal: Negative for abdominal pain, bowel incontinence, diarrhea and excessive appetite.  Genitourinary: Negative for decreased libido, genital sores and incomplete emptying.  Neurological: Negative for brief paralysis, focal weakness, headaches and loss of balance.  Psychiatric/Behavioral: Negative for altered mental status, depression and suicidal ideas.  Allergic/Immunologic: Negative for HIV exposure and persistent infections.    EKGs/Labs/Other Studies Reviewed:    The following studies were reviewed today:   EKG:  The ekg ordered today demonstrates   Recent Labs: 07/24/2019: TSH 1.279 04/11/2020: ALT 17 04/16/2020: Magnesium 1.8 04/17/2020: BUN 15; Creatinine, Ser 1.51; Hemoglobin 13.2; Platelets 224; Potassium 4.0; Sodium 135  Recent Lipid Panel    Component Value Date/Time   CHOL 89 (L)  04/11/2020 0825   TRIG 143 04/11/2020 0825   HDL 41 04/11/2020 0825   CHOLHDL 2.2 04/11/2020 0825   CHOLHDL 2.3 07/24/2019 1946   VLDL 15 07/24/2019 1946   LDLCALC 24 04/11/2020 0825    Physical Exam:    VS:  BP 120/90 (BP Location: Right Arm, Patient Position: Sitting, Cuff Size: Normal)   Pulse 74   Ht 5\' 11"  (1.803 m)   Wt 190 lb (86.2 kg)   SpO2 99%   BMI 26.50 kg/m     Wt Readings from Last 3 Encounters:  04/25/20 190 lb (86.2 kg)  04/17/20 179 lb 3.2 oz (81.3 kg)  04/11/20 188 lb 1.3 oz (85.3 kg)     GEN: Well nourished, well developed in no acute distress HEENT: Normal NECK: No JVD; No carotid bruits LYMPHATICS: No lymphadenopathy CARDIAC: S1S2 noted,RRR, no murmurs, rubs, gallops RESPIRATORY:  Clear to auscultation without rales, wheezing or rhonchi  ABDOMEN: Soft, non-tender, non-distended, +bowel sounds, no guarding. EXTREMITIES: No edema, No cyanosis, no clubbing MUSCULOSKELETAL:  No deformity  SKIN: Warm and dry NEUROLOGIC:  Alert and oriented x 3, non-focal PSYCHIATRIC:  Normal affect, good insight  ASSESSMENT:    1. Right groin pain    PLAN:     1.  The patient is in agreement with the above plan. The patient left the office in stable condition.  The patient will follow up in   Medication Adjustments/Labs and Tests Ordered: Current medicines are reviewed at length with the patient today.  Concerns regarding medicines are outlined above.  Orders Placed This Encounter  Procedures  . VAS [place] LOWER EXTREMITY ARTERIAL DUPLEX   No orders of the defined types were placed in this encounter.   Patient Instructions  Medication Instructions:  Your physician recommends that you continue on your current medications as directed. Please refer to the Current Medication list given to you today.  *If you need a refill on your cardiac medications before your next appointment, please call your pharmacy*   Lab Work: None If you have labs (blood work) drawn  today and your tests are completely normal, you will receive your results only by: Marland Kitchen MyChart Message (if you have MyChart) OR . A paper copy in the mail If you have any lab test that is abnormal or we need to change your treatment, we will call you to review the results.   Testing/Procedures: Your physician has requested that you have a lower extremity arterial duplex. This test is an ultrasound of the arteries in the legs. It looks at arterial blood flow in the legs. Allow one hour for Lower and Upper Arterial scans. There are no restrictions or special instructions  Follow-Up: At [facility], you and your health needs are our priority.  As part of our continuing mission to provide you with exceptional heart care, we have created designated Provider Care Teams.  These Care Teams include your primary Cardiologist (physician) and Advanced Practice Providers (APPs -  Physician Assistants and Nurse Practitioners) who all work together to provide you with the care you need, when you need it.  We recommend signing up for the patient portal called "MyChart".  Sign up information is provided on this After Visit Summary.  MyChart is used to connect with patients for Virtual Visits (Telemedicine).  Patients are able to view lab/test results, encounter notes, upcoming appointments, etc.  Non-urgent messages can be sent to your provider as well.   To learn more about what you can do with MyChart, go to [email or web address].    Your next appointment:   4 week(s)  The format for your next appointment:   In Person  Provider:   Shirlee More, MD   Other Instructions     Adopting a Healthy Lifestyle.  Know what a healthy weight is for you (roughly BMI <25) and aim to maintain this   Aim for 7+ servings of fruits and vegetables daily   65-80+ fluid ounces of water or unsweet tea for healthy kidneys   Limit to max 1 drink of alcohol per day; avoid smoking/tobacco   Limit animal fats in  diet for cholesterol and heart health - choose grass fed whenever available   Avoid highly processed foods, and foods high in saturated/trans fats   Aim for low stress - take time to unwind and care for your mental health   Aim for 150 min of moderate intensity exercise weekly for heart health, and weights twice weekly for bone health   Aim for 7-9 hours of sleep daily   When it comes to diets, agreement about the perfect plan isnt easy to find, even among the experts. Experts at the [employer] developed an idea known as the Healthy Eating Plate. Just imagine a plate divided into logical, healthy portions.   The emphasis is on diet quality:   Load up on vegetables and fruits - one-half of your plate: Aim for color and variety, and remember that potatoes dont count.   Go for whole grains - one-quarter of your plate: Whole wheat, barley, wheat berries, quinoa, oats, brown rice, and foods made with them. If you want pasta, go with whole wheat pasta.   Protein power - one-quarter of your plate: Fish, chicken, beans, and nuts are all healthy, versatile protein sources. Limit red meat.   The diet, however, does go beyond the plate, offering a few other suggestions.   Use healthy plant oils, such as olive, canola, soy, corn, sunflower and peanut. Check the labels, and avoid partially hydrogenated oil, which have unhealthy trans fats.   If youre thirsty, drink water. Coffee and tea are good in moderation, but skip sugary drinks and limit milk and dairy products to one or two daily servings.   The type of carbohydrate in the diet is more important than the amount. Some sources of carbohydrates, such as vegetables, fruits, whole grains, and beans-are healthier than others.   Finally, stay active  Signed, Berniece Salines, DO  04/25/2020 11:53 PM    [facility]

## 2020-04-25 NOTE — Patient Instructions (Signed)
Medication Instructions:  Your physician recommends that you continue on your current medications as directed. Please refer to the Current Medication list given to you today.  *If you need a refill on your cardiac medications before your next appointment, please call your pharmacy*   Lab Work: None If you have labs (blood work) drawn today and your tests are completely normal, you will receive your results only by: Marland Kitchen MyChart Message (if you have MyChart) OR . A paper copy in the mail If you have any lab test that is abnormal or we need to change your treatment, we will call you to review the results.   Testing/Procedures: Your physician has requested that you have a lower extremity arterial duplex. This test is an ultrasound of the arteries in the legs. It looks at arterial blood flow in the legs. Allow one hour for Lower and Upper Arterial scans. There are no restrictions or special instructions    Follow-Up: At [facility], you and your health needs are our priority.  As part of our continuing mission to provide you with exceptional heart care, we have created designated Provider Care Teams.  These Care Teams include your primary Cardiologist (physician) and Advanced Practice Providers (APPs -  Physician Assistants and Nurse Practitioners) who all work together to provide you with the care you need, when you need it.  We recommend signing up for the patient portal called "MyChart".  Sign up information is provided on this After Visit Summary.  MyChart is used to connect with patients for Virtual Visits (Telemedicine).  Patients are able to view lab/test results, encounter notes, upcoming appointments, etc.  Non-urgent messages can be sent to your provider as well.   To learn more about what you can do with MyChart, go to [email or web address].    Your next appointment:   4 week(s)  The format for your next appointment:   In Person  Provider:   Shirlee More, MD   Other  Instructions

## 2020-04-28 ENCOUNTER — Encounter (HOSPITAL_COMMUNITY): Payer: Commercial Managed Care - PPO

## 2020-04-28 NOTE — Telephone Encounter (Signed)
This was discussed with the patient via [place].

## 2020-04-28 NOTE — Telephone Encounter (Signed)
Yes he was seen by her Friday, and the order was placed. I just wanted to let you know what was going on with him just in case he calls.

## 2020-04-29 ENCOUNTER — Other Ambulatory Visit: Payer: Self-pay

## 2020-04-29 ENCOUNTER — Ambulatory Visit (HOSPITAL_COMMUNITY)
Admission: RE | Admit: 2020-04-29 | Discharge: 2020-04-29 | Disposition: A | Discharge status: Home / Self Care | Payer: Commercial Managed Care - PPO | Source: Ambulatory Visit | Attending: Cardiology | Admitting: Cardiology

## 2020-04-29 ENCOUNTER — Telehealth: Payer: Self-pay

## 2020-04-29 DIAGNOSIS — R1031 Right lower quadrant pain: Secondary | ICD-10-CM | POA: Diagnosis not present

## 2020-04-29 NOTE — Progress Notes (Signed)
Lower extremity arterial duplex has been completed.   Preliminary results in CV Proc.   Abram Sander 04/29/2020 3:08 PM

## 2020-04-29 NOTE — Telephone Encounter (Signed)
Spoke with patient regarding results and recommendation.  Patient verbalizes understanding and is agreeable to plan of care. Advised patient to call back with any issues or concerns.   Patient states that there is still pain at the site but it is better and at times it even comes and goes. He states that he will call back if he has any other issues before seeing Dr. Bettina Gavia next week.   Encouraged patient to call back with any questions or concerns.

## 2020-04-29 NOTE — Telephone Encounter (Signed)
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  It probably has resolved.  Please find out how he is doing. He follows with Dr. Bettina Gavia but I saw him last Friday because she was having some groin pain.

## 2020-05-05 ENCOUNTER — Other Ambulatory Visit: Payer: Self-pay | Admitting: Cardiology

## 2020-05-05 DIAGNOSIS — Z951 Presence of aortocoronary bypass graft: Secondary | ICD-10-CM

## 2020-05-05 DIAGNOSIS — E785 Hyperlipidemia, unspecified: Secondary | ICD-10-CM

## 2020-05-06 NOTE — Progress Notes (Signed)
Cardiology Office Note:    Date:  05/07/2020   ID:  Tyler Sweeney, DOB 28-Jul-1960, MRN [id number]  PCP:  Orpah Melter, MD  Cardiologist:  Shirlee More, MD    Referring MD: Orpah Melter, MD    ASSESSMENT:    1. Coronary artery disease involving native coronary artery of native heart without angina pectoris   2. Dyslipidemia, goal LDL below 70   3. Elevated Lp(a)   4. Right groin pain    PLAN:    In order of problems listed above:  1. Stable doing well asymptomatic New York Heart Association class I continue current treatment including aspirin beta-blocker oral nitrate and intense lipid-lowering therapy with a high intensity statin and PCSK9 inhibitor. 2. His lipids are ideal continue current treatment LDL less than 50 3. He will have his family screen for LP(a) I gave him a handwritten note 4. Improving he does not have pseudoaneurysm he has resolving hematoma   Next appointment: 6 months   Medication Adjustments/Labs and Tests Ordered: Current medicines are reviewed at length with the patient today.  Concerns regarding medicines are outlined above.  No orders of the defined types were placed in this encounter.  No orders of the defined types were placed in this encounter.   Chief Complaint  Patient presents with  . Follow-up    After coronary angiography    History of Present Illness:    Tyler Sweeney is a 60 y.o. male with a hx of CAD with CABG 07/25/2019 as well as previous PCI and drug-eluting stent to the left anterior descending artery coronary artery and obtuse marginal branch in 2014 hypertension and hyperlipidemia with elevated LP(a) last seen 04/25/2020.  At that time he is having groin pain and groin pain with suspected pseudoaneurysm.  Duplex performed 04/29/2020 showing no finding of pseudoaneurysm.  Compliance with diet, lifestyle and medications: Yes  Is very good healthcare literacy and understands the nuances of his coronary angiography. He is  confident he is exercising is not having angina shortness of breath palpitation or syncope. We discussed alterations in lipid-lowering therapy and he prefers to remain on his PCSK9 inhibitor and high intensity statin and has an optimal LDL no muscle pain or weakness. 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  209 2022: LEFT HEART CATH AND CORS/GRAFTS ANGIOGRAPHY   Conclusion  Ost LAD to Prox LAD lesion is 60% stenosed.  Mid LAD lesion is 20% stenosed.  2nd Diag lesion is 70% stenosed.  1st Mrg lesion is 60% stenosed.  Ost Cx to Prox Cx lesion is 50% stenosed.  Mid Cx lesion is 60% stenosed.  Prox RCA to Mid RCA lesion is 30% stenosed.  The left ventricular systolic function is normal.  LV end diastolic pressure is normal.  The left ventricular ejection fraction is 55-65% by visual estimate.  SVG and is normal in caliber.  The graft exhibits no disease.  Left radial artery.  Dist Graft lesion is 100% stenosed.  Mid Graft lesion is 100% stenosed.   1.  Significant underlying three-vessel coronary artery disease with atretic LIMA to LAD and radial artery graft to OM 2.  Patent SVG to diagonal which supplies retrograde flow to the LAD.  In addition, the ostial LAD stenosis  that is caused by the ostial left circumflex stent does not seem to be obstructive given that there is very good antegrade flow.  The left circumflex itself has moderate nonobstructive disease due to in-stent restenosis as well as moderate stenosis distal to the stent.   2.  Normal LV systolic function and normal left ventricular end-diastolic pressure.   Recommendations: Suspect failure of LIMA to LAD and radial artery graft to OM is  due to competitive flow from the native vessels as well as competitive flow to the LAD from the SVG to diagonal. I do not see a need for revascularization at this point given that the disease in the left circumflex appears to be moderate and the LAD distribution is getting adequate antegrade flow as well as retrograde flow from the SVG to diagonal.   Recommend aggressive medical therapy.   Wall Motion  Resting                 Left Heart  Left Ventricle The left ventricular size is normal. The left ventricular systolic function is normal. LV end diastolic pressure is normal. The left ventricular ejection fraction is 55-65% by visual estimate. No regional wall motion abnormalities.   Coronary Diagrams   Diagnostic Dominance: Right     Past Medical History:  Diagnosis Date  . Anginal pain ([place])   . Asthma    as a child  . CAD (coronary artery disease) 11/24/2012   11/24/12 Cath nl LM, 99%LAD post D1, 100% 1rst OM with collaterals, 40% circumflex, no RCA dz, s/p DES LAD & CFX/OM1 Dr. Tamala Julian    . Hyperlipidemia 11/24/2012  . MI, old 2014   Diagnosis based on left ventricular dysfunction seen on stress test  . Personal history of other diseases of digestive system   . Polycystic kidney disease    "dx'd but have never had any symptoms" (07/05/2013)    Past Surgical History:  Procedure Laterality Date  . CARDIAC CATHETERIZATION  04/16/2020  . COLONOSCOPY  11/02/2011   Procedure: COLONOSCOPY;  Surgeon: Winfield Cunas., MD;  Location: [facility];  Service: Endoscopy;  Laterality: N/A;  . CORONARY ANGIOPLASTY  07/05/2013  . CORONARY ANGIOPLASTY WITH STENT PLACEMENT  11/24/2012   "3 stents"  . CORONARY ARTERY BYPASS GRAFT N/A 07/25/2019   Procedure: CORONARY ARTERY BYPASS GRAFTING (CABG) x4 WITH LEFT RADIAL ARTERY HARVEST.  LIMA TO LAD, RADIAL ARTERY TO OM1 AND OM2 SEQUENTIAL, SVG TO DIAGONAL.;  Surgeon: Melrose Nakayama, MD;  Location: [place];  Service: Open Heart Surgery;   Laterality: N/A;  . ENDOVEIN HARVEST OF GREATER SAPHENOUS VEIN Right 07/25/2019   Procedure: [place] Of Greater Saphenous Vein;  Surgeon: Melrose Nakayama, MD;  Location: [place];  Service: Open Heart Surgery;  Laterality: Right;  . FOOT SURGERY Left 1984   "cyst"  . INGUINAL HERNIA REPAIR Right 1962  . LAPAROSCOPIC APPENDECTOMY N/A 09/19/2016   Procedure: APPENDECTOMY LAPAROSCOPIC;  Surgeon: Ralene Ok, MD;  Location: [place];  Service: General;  Laterality: N/A;  . LEFT HEART CATH AND CORONARY ANGIOGRAPHY N/A 07/24/2019   Procedure: LEFT HEART CATH AND CORONARY ANGIOGRAPHY;  Surgeon: Burnell Blanks, MD;  Location: [place] [facility];  Service: Cardiovascular;  Laterality: N/A;  . LEFT HEART CATH AND CORS/GRAFTS ANGIOGRAPHY N/A 04/16/2020   Procedure: LEFT HEART CATH AND CORS/GRAFTS ANGIOGRAPHY;  Surgeon: Wellington Hampshire, MD;  Location: [place] [facility];  Service: Cardiovascular;  Laterality: N/A;  . LEFT HEART CATHETERIZATION WITH CORONARY ANGIOGRAM  N/A 11/24/2012   Procedure: LEFT HEART CATHETERIZATION WITH CORONARY ANGIOGRAM;  Surgeon: Jacolyn Reedy, MD;  Location: [facility] CATH LAB;  Service: Cardiovascular;  Laterality: N/A;  . LEFT HEART CATHETERIZATION WITH CORONARY ANGIOGRAM N/A 07/05/2013   Procedure: LEFT HEART CATHETERIZATION WITH CORONARY ANGIOGRAM;  Surgeon: Jacolyn Reedy, MD;  Location: [facility] CATH LAB;  Service: Cardiovascular;  Laterality: N/A;  . PERCUTANEOUS CORONARY STENT INTERVENTION (PCI-S)  11/24/2012   Procedure: PERCUTANEOUS CORONARY STENT INTERVENTION (PCI-S);  Surgeon: Jacolyn Reedy, MD;  Location: [facility] CATH LAB;  Service: Cardiovascular;;  . PERCUTANEOUS CORONARY STENT INTERVENTION (PCI-S) Right 07/05/2013   Procedure: PERCUTANEOUS CORONARY STENT INTERVENTION (PCI-S);  Surgeon: Jacolyn Reedy, MD;  Location: [facility] CATH LAB;  Service: Cardiovascular;  Laterality: Right;  . TEE WITHOUT CARDIOVERSION N/A 07/25/2019   Procedure: TRANSESOPHAGEAL  ECHOCARDIOGRAM (TEE);  Surgeon: Melrose Nakayama, MD;  Location: [place];  Service: Open Heart Surgery;  Laterality: N/A;    Current Medications: Current Meds  Medication Sig  . aspirin 81 MG chewable tablet Chew 1 tablet (81 mg total) by mouth daily.  . Cholecalciferol (VITAMIN D3 PO) Take 1 tablet by mouth every 7 (seven) days.  . famotidine (PEPCID) 10 MG tablet Take 1 tablet (10 mg total) by mouth 2 (two) times daily.  . isosorbide mononitrate (IMDUR) 30 MG 24 hr tablet Take 1 tablet (30 mg total) by mouth daily.  . metoprolol succinate (TOPROL-XL) 50 MG 24 hr tablet TAKE 1 TABLET (50 MG TOTAL) BY MOUTH DAILY. TAKE WITH OR IMMEDIATELY FOLLOWING A MEAL.  . Multiple Vitamins-Minerals (ZINC PO) Take 1 tablet by mouth See admin instructions. Takes every 3-4 days  . nitroGLYCERIN (NITROSTAT) 0.4 MG SL tablet Place 1 tablet (0.4 mg total) under the tongue every 5 (five) minutes as needed for chest pain.  Marland Kitchen PRALUENT 75 MG/ML SOAJ INJECT 1 ML INTO THE SKIN EVERY 14 (FOURTEEN) DAYS.  Marland Kitchen rosuvastatin (CRESTOR) 10 MG tablet Take 1 tablet (10 mg total) by mouth daily.     Allergies:   Shellfish allergy, Gluten meal, Milk-related compounds, and Ramipril   Social History   Socioeconomic History  . Marital status: Married    Spouse name: Not on file  . Number of children: Not on file  . Years of education: Not on file  . Highest education level: Not on file  Occupational History  . 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 Smoking status: Never Smoker  . Smokeless tobacco: Never Used  Vaping Use  . Vaping Use: Never used  Substance and Sexual Activity  . Alcohol use: No  . Drug use: No  . Sexual activity: Yes  Other Topics Concern  . Not on file  Social History Narrative  . Not on file   Social Determinants of Health   Financial Resource Strain: Not on file  Food Insecurity: Not on file  Transportation Needs: Not on file  Physical Activity: Not on file   Stress: Not on file  Social Connections: Not on file     Family History: The patient's family history includes Coronary artery disease in an other family member; Heart attack (age of onset: 60) in his mother; Heart attack (age of onset: 27) in his father; Stroke (age of onset: 75) in his mother. ROS:   Please see the history of present illness.    All other systems reviewed and are negative.  EKGs/Labs/Other Studies Reviewed:    The following studies were reviewed today:  Recent Labs: 07/24/2019: TSH 1.279 04/11/2020: ALT 17 04/16/2020: Magnesium 1.8 04/17/2020: BUN 15; Creatinine, Ser 1.51; Hemoglobin 13.2; Platelets 224; Potassium 4.0; Sodium 135  Recent Lipid Panel    Component Value Date/Time   CHOL 89 (L) 04/11/2020 0825   TRIG 143 04/11/2020 0825   HDL 41 04/11/2020 0825   CHOLHDL 2.2 04/11/2020 0825   CHOLHDL 2.3 07/24/2019 1946   VLDL 15 07/24/2019 1946   LDLCALC 24 04/11/2020 0825    Physical Exam:    VS:  BP 134/88   Pulse 72   Ht 5\' 11"  (1.803 m)   Wt 188 lb (85.3 kg)   SpO2 97%   BMI 26.22 kg/m     Wt Readings from Last 3 Encounters:  05/07/20 188 lb (85.3 kg)  04/25/20 190 lb (86.2 kg)  04/17/20 179 lb 3.2 oz (81.3 kg)     GEN:  Well nourished, well developed in no acute distress HEENT: Normal NECK: No JVD; No carotid bruits LYMPHATICS: No lymphadenopathy CARDIAC: RRR, no murmurs, rubs, gallops RESPIRATORY:  Clear to auscultation without rales, wheezing or rhonchi  ABDOMEN: Soft, non-tender, non-distended MUSCULOSKELETAL:  No edema; No deformity  SKIN: Warm and dry NEUROLOGIC:  Alert and oriented x 3 PSYCHIATRIC:  Normal affect    Signed, Shirlee More, MD  05/07/2020 9:40 AM    [place]

## 2020-05-07 ENCOUNTER — Other Ambulatory Visit: Payer: Self-pay

## 2020-05-07 ENCOUNTER — Encounter: Payer: Self-pay | Admitting: Cardiology

## 2020-05-07 ENCOUNTER — Ambulatory Visit: Payer: Commercial Managed Care - PPO | Admitting: Cardiology

## 2020-05-07 VITALS — BP 134/88 | HR 72 | Ht 71.0 in | Wt 188.0 lb

## 2020-05-07 DIAGNOSIS — E785 Hyperlipidemia, unspecified: Secondary | ICD-10-CM | POA: Diagnosis not present

## 2020-05-07 DIAGNOSIS — R1031 Right lower quadrant pain: Secondary | ICD-10-CM

## 2020-05-07 DIAGNOSIS — I2510 Atherosclerotic heart disease of native coronary artery without angina pectoris: Secondary | ICD-10-CM

## 2020-05-07 DIAGNOSIS — E7841 Elevated Lipoprotein(a): Secondary | ICD-10-CM

## 2020-05-07 NOTE — Patient Instructions (Signed)

## 2020-05-12 ENCOUNTER — Other Ambulatory Visit: Payer: Self-pay | Admitting: Cardiology

## 2020-07-15 ENCOUNTER — Other Ambulatory Visit: Payer: Self-pay | Admitting: Student

## 2020-07-17 NOTE — Telephone Encounter (Signed)
This is a [facility] pt, Dr. Bettina Gavia

## 2020-09-23 ENCOUNTER — Other Ambulatory Visit: Payer: Self-pay | Admitting: Thoracic Surgery (Cardiothoracic Vascular Surgery)

## 2020-09-30 ENCOUNTER — Ambulatory Visit: Payer: Commercial Managed Care - PPO | Admitting: Dermatology

## 2020-10-16 ENCOUNTER — Ambulatory Visit: Payer: Commercial Managed Care - PPO | Admitting: Cardiology

## 2020-11-09 ENCOUNTER — Other Ambulatory Visit: Payer: Self-pay | Admitting: Cardiology

## 2020-11-11 NOTE — Telephone Encounter (Signed)
Rosuvastatin 10 mg # 90 x 3 refills sent to  [id number]- [place], [place] - [address]

## 2020-12-01 ENCOUNTER — Ambulatory Visit: Payer: Commercial Managed Care - PPO | Admitting: Cardiology

## 2020-12-01 ENCOUNTER — Other Ambulatory Visit: Payer: Self-pay

## 2020-12-01 ENCOUNTER — Encounter: Payer: Self-pay | Admitting: Cardiology

## 2020-12-01 VITALS — BP 130/82 | HR 68 | Ht 71.0 in | Wt 184.0 lb

## 2020-12-01 DIAGNOSIS — E7841 Elevated Lipoprotein(a): Secondary | ICD-10-CM

## 2020-12-01 DIAGNOSIS — I2510 Atherosclerotic heart disease of native coronary artery without angina pectoris: Secondary | ICD-10-CM

## 2020-12-01 DIAGNOSIS — E785 Hyperlipidemia, unspecified: Secondary | ICD-10-CM | POA: Diagnosis not present

## 2020-12-01 MED ORDER — METOPROLOL SUCCINATE ER 50 MG PO TB24: 50.0000 mg | ORAL_TABLET | Freq: Every day | ORAL | 1 refills | Status: DC

## 2020-12-01 NOTE — Patient Instructions (Signed)
Medication Instructions:  Your physician recommends that you continue on your current medications as directed. Please refer to the Current Medication list given to you today.  *If you need a refill on your cardiac medications before your next appointment, please call your pharmacy*   Lab Work: Your physician recommends that you return for lab work in: TODAY CMP, Lipids, Lpa If you have labs (blood work) drawn today and your tests are completely normal, you will receive your results only by: [place] (if you have MyChart) OR A paper copy in the mail If you have any lab test that is abnormal or we need to change your treatment, we will call you to review the results.   Testing/Procedures: None   Follow-Up: At [facility], you and your health needs are our priority.  As part of our continuing mission to provide you with exceptional heart care, we have created designated Provider Care Teams.  These Care Teams include your primary Cardiologist (physician) and Advanced Practice Providers (APPs -  Physician Assistants and Nurse Practitioners) who all work together to provide you with the care you need, when you need it.  We recommend signing up for the patient portal called "MyChart".  Sign up information is provided on this After Visit Summary.  MyChart is used to connect with patients for Virtual Visits (Telemedicine).  Patients are able to view lab/test results, encounter notes, upcoming appointments, etc.  Non-urgent messages can be sent to your provider as well.   To learn more about what you can do with MyChart, go to [email or web address].    Your next appointment:   6 month(s)  The format for your next appointment:   In Person  Provider:   Shirlee More, MD   Other Instructions

## 2020-12-01 NOTE — Progress Notes (Signed)
Cardiology Office Note:    Date:  12/01/2020   ID:  Tyler Sweeney, DOB [birth year], MRN [id number]  PCP:  Orpah Melter, MD  Cardiologist:  Shirlee More, MD    Referring MD: Orpah Melter, MD    ASSESSMENT:    1. Coronary artery disease involving native coronary artery of native heart without angina pectoris   2. Dyslipidemia, goal LDL below 70   3. Elevated Lp(a)    PLAN:    In order of problems listed above:  Stable having no anginal discomfort continue medical treatment at this time not restart his beta-blocker although if he has anginal discomfort he will refill the prescription and continue aspirin combined lipid-lowering with a high intensity statin PCSK9 inhibitor and oral nitrate.   Next appointment: 6 months   Medication Adjustments/Labs and Tests Ordered: Current medicines are reviewed at length with the patient today.  Concerns regarding medicines are outlined above.  No orders of the defined types were placed in this encounter.  No orders of the defined types were placed in this encounter.   No chief complaint on file.   History of Present Illness:    Tyler Sweeney is a 60 y.o. 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   Compliance with diet, lifestyle and medications: Yes  His prescription for metoprolol expired he has been off for a month feels well has had no angina and prefers not to restart.  I told him if he starts to have angina again I will put his prescription on file and to resume. He continues to  exercise on a regular basis and has had no angina chest pain shortness of breath palpitation or syncope.  He tolerates his lipid-lowering without muscle pain or weakness and we will recheck a lipid profile and LP(a) level today. Past Medical History:  Diagnosis Date   Anginal pain ([place])    Asthma    as a child   CAD (coronary artery disease) 11/24/2012   11/24/12 Cath nl LM, 99%LAD post D1, 100% 1rst OM with collaterals, 40% circumflex, no RCA dz, s/p DES LAD & CFX/OM1 Dr. Tamala Julian     Hyperlipidemia 11/24/2012   MI, old 2014   Diagnosis based on left ventricular dysfunction seen on stress test   Personal history of other diseases of digestive system    Polycystic kidney disease    "dx'd but have never had any symptoms" (07/05/2013)    Past Surgical History:  Procedure Laterality Date   CARDIAC CATHETERIZATION  04/16/2020   COLONOSCOPY  11/02/2011   Procedure: COLONOSCOPY;  Surgeon: Winfield Cunas., MD;  Location: [facility];  Service: Endoscopy;  Laterality: N/A;   CORONARY ANGIOPLASTY  07/05/2013   CORONARY ANGIOPLASTY WITH STENT PLACEMENT  11/24/2012   "3 stents"   CORONARY ARTERY BYPASS GRAFT N/A 07/25/2019   Procedure: CORONARY ARTERY BYPASS GRAFTING (CABG) x4 WITH LEFT RADIAL ARTERY HARVEST.  LIMA TO LAD, RADIAL ARTERY TO OM1 AND OM2 SEQUENTIAL, SVG TO DIAGONAL.;  Surgeon: Melrose Nakayama, MD;  Location: [place];  Service: Open Heart Surgery;  Laterality: N/A;   ENDOVEIN HARVEST  OF GREATER SAPHENOUS VEIN Right 07/25/2019   Procedure: [place] Of Greater Saphenous Vein;  Surgeon: Melrose Nakayama, MD;  Location: [place];  Service: Open Heart Surgery;  Laterality: Right;   FOOT SURGERY Left 1984   "cyst"   INGUINAL HERNIA REPAIR Right 1962   LAPAROSCOPIC APPENDECTOMY N/A 09/19/2016   Procedure: APPENDECTOMY LAPAROSCOPIC;  Surgeon: Ralene Ok, MD;  Location: [place];  Service: General;  Laterality: N/A;   LEFT HEART CATH AND CORONARY ANGIOGRAPHY N/A 07/24/2019    Procedure: LEFT HEART CATH AND CORONARY ANGIOGRAPHY;  Surgeon: Burnell Blanks, MD;  Location: [place] CV LAB;  Service: Cardiovascular;  Laterality: N/A;   LEFT HEART CATH AND CORS/GRAFTS ANGIOGRAPHY N/A 04/16/2020   Procedure: LEFT HEART CATH AND CORS/GRAFTS ANGIOGRAPHY;  Surgeon: Wellington Hampshire, MD;  Location: [place] [facility];  Service: Cardiovascular;  Laterality: N/A;   LEFT HEART CATHETERIZATION WITH CORONARY ANGIOGRAM N/A 11/24/2012   Procedure: LEFT HEART CATHETERIZATION WITH CORONARY ANGIOGRAM;  Surgeon: Jacolyn Reedy, MD;  Location: [facility] CATH LAB;  Service: Cardiovascular;  Laterality: N/A;   LEFT HEART CATHETERIZATION WITH CORONARY ANGIOGRAM N/A 07/05/2013   Procedure: LEFT HEART CATHETERIZATION WITH CORONARY ANGIOGRAM;  Surgeon: Jacolyn Reedy, MD;  Location: [facility] CATH LAB;  Service: Cardiovascular;  Laterality: N/A;   PERCUTANEOUS CORONARY STENT INTERVENTION (PCI-S)  11/24/2012   Procedure: PERCUTANEOUS CORONARY STENT INTERVENTION (PCI-S);  Surgeon: Jacolyn Reedy, MD;  Location: [facility] CATH LAB;  Service: Cardiovascular;;   PERCUTANEOUS CORONARY STENT INTERVENTION (PCI-S) Right 07/05/2013   Procedure: PERCUTANEOUS CORONARY STENT INTERVENTION (PCI-S);  Surgeon: Jacolyn Reedy, MD;  Location: [facility] CATH LAB;  Service: Cardiovascular;  Laterality: Right;   TEE WITHOUT CARDIOVERSION N/A 07/25/2019   Procedure: TRANSESOPHAGEAL ECHOCARDIOGRAM (TEE);  Surgeon: Melrose Nakayama, MD;  Location: [place];  Service: Open Heart Surgery;  Laterality: N/A;    Current Medications: Current Meds  Medication Sig   aspirin 81 MG chewable tablet Chew 1 tablet (81 mg total) by mouth daily.   Cholecalciferol (VITAMIN D3 PO) Take 1 tablet by mouth every 7 (seven) days.   famotidine (PEPCID) 10 MG tablet Take 1 tablet (10 mg total) by mouth 2 (two) times daily.   isosorbide mononitrate (IMDUR) 30 MG 24 hr tablet TAKE 1 TABLET BY MOUTH EVERY DAY   Multiple Vitamins-Minerals (ZINC PO) Take 1  tablet by mouth See admin instructions. Takes every 3-4 days   nitroGLYCERIN (NITROSTAT) 0.4 MG SL tablet Place 1 tablet (0.4 mg total) under the tongue every 5 (five) minutes as needed for chest pain.   PRALUENT 75 MG/ML SOAJ INJECT 1 ML INTO THE SKIN EVERY 14 (FOURTEEN) DAYS.   rosuvastatin (CRESTOR) 10 MG tablet TAKE 1 TABLET BY MOUTH EVERY DAY     Allergies:   Shellfish allergy, Gluten meal, Milk-related compounds, and Ramipril   Social History   Socioeconomic History   Marital status: Married    Spouse name: Not on file   Number of children: Not on file   Years of education: Not on file   Highest education level: Not on file  Occupational History   Occupation: PARKING MANAGER    Employer: [employer]  Tobacco Use   Smoking status: Never   Smokeless tobacco: Never  Vaping Use   Vaping Use: Never used  Substance and Sexual Activity   Alcohol use: No   Drug use: No   Sexual activity: Yes  Other Topics Concern   Not on file  Social History Narrative  Not on file   Social Determinants of Health   Financial Resource Strain: Not on file  Food Insecurity: Not on file  Transportation Needs: Not on file  Physical Activity: Not on file  Stress: Not on file  Social Connections: Not on file     Family History: The patient's family history includes Coronary artery disease in an other family member; Heart attack (age of onset: 34) in his mother; Heart attack (age of onset: 62) in his father; Stroke (age of onset: 22) in his mother. ROS:   Please see the history of present illness.    All other systems reviewed and are negative.  EKGs/Labs/Other Studies Reviewed:    The following studies were reviewed today:   Recent Labs: 04/11/2020: ALT 17 04/16/2020: Magnesium 1.8 04/17/2020: BUN 15; Creatinine, Ser 1.51; Hemoglobin 13.2; Platelets 224; Potassium 4.0; Sodium 135  Recent Lipid Panel    Component Value Date/Time   CHOL 89 (L) 04/11/2020 0825   TRIG 143 04/11/2020 0825    HDL 41 04/11/2020 0825   CHOLHDL 2.2 04/11/2020 0825   CHOLHDL 2.3 07/24/2019 1946   VLDL 15 07/24/2019 1946   LDLCALC 24 04/11/2020 0825    Physical Exam:    VS:  BP 130/82 (BP Location: Right Arm, Patient Position: Sitting, Cuff Size: Normal)   Pulse 68   Ht 5\' 11"  (1.803 m)   Wt 184 lb (83.5 kg)   SpO2 98%   BMI 25.66 kg/m     Wt Readings from Last 3 Encounters:  12/01/20 184 lb (83.5 kg)  05/07/20 188 lb (85.3 kg)  04/25/20 190 lb (86.2 kg)     GEN:  Well nourished, well developed in no acute distress he has no xanthoma or xanthelasma HEENT: Normal NECK: No JVD; No carotid bruits LYMPHATICS: No lymphadenopathy CARDIAC: RRR, no murmurs, rubs, gallops RESPIRATORY:  Clear to auscultation without rales, wheezing or rhonchi  ABDOMEN: Soft, non-tender, non-distended MUSCULOSKELETAL:  No edema; No deformity  SKIN: Warm and dry NEUROLOGIC:  Alert and oriented x 3 PSYCHIATRIC:  Normal affect    Signed, Shirlee More, MD  12/01/2020 8:18 AM    [place]

## 2020-12-02 ENCOUNTER — Telehealth: Payer: Self-pay

## 2020-12-02 LAB — LIPID PANEL
Chol/HDL Ratio: 1.6 ratio (ref 0.0–5.0)
Cholesterol, Total: 78 mg/dL — ABNORMAL LOW (ref 100–199)
HDL: 49 mg/dL (ref >39)
LDL Chol Calc (NIH): 14 mg/dL (ref 0–99)
Triglycerides: 68 mg/dL (ref 0–149)
VLDL Cholesterol Cal: 15 mg/dL (ref 5–40)

## 2020-12-02 LAB — COMPREHENSIVE METABOLIC PANEL
ALT: 32 IU/L (ref 0–44)
AST: 26 IU/L (ref 0–40)
Albumin/Globulin Ratio: 1.9 (ref 1.2–2.2)
Albumin: 4.5 g/dL (ref 3.8–4.9)
Alkaline Phosphatase: 117 IU/L (ref 44–121)
BUN/Creatinine Ratio: 10 (ref 10–24)
BUN: 15 mg/dL (ref 8–27)
Bilirubin Total: 0.7 mg/dL (ref 0.0–1.2)
CO2: 17 mmol/L — ABNORMAL LOW (ref 20–29)
Calcium: 8.9 mg/dL (ref 8.6–10.2)
Chloride: 102 mmol/L (ref 96–106)
Creatinine, Ser: 1.45 mg/dL — ABNORMAL HIGH (ref 0.76–1.27)
Globulin, Total: 2.4 g/dL (ref 1.5–4.5)
Glucose: 78 mg/dL (ref 65–99)
Potassium: 4.6 mmol/L (ref 3.5–5.2)
Sodium: 138 mmol/L (ref 134–144)
Total Protein: 6.9 g/dL (ref 6.0–8.5)
eGFR: 55 mL/min/{1.73_m2} — ABNORMAL LOW (ref >59)

## 2020-12-02 LAB — LIPOPROTEIN A (LPA): Lipoprotein (a): 65.6 nmol/L (ref <75.0)

## 2020-12-02 NOTE — Telephone Encounter (Signed)
Spoke with patient regarding results and recommendation.  Patient verbalizes understanding and is agreeable to plan of care. Advised patient to call back with any issues or concerns.  

## 2020-12-02 NOTE — Telephone Encounter (Signed)
-----   Message from Richardo Priest, MD sent at 12/02/2020  8:41 AM EDT ----- Good result continue current treatment for lipids.

## 2020-12-15 ENCOUNTER — Other Ambulatory Visit: Payer: Self-pay | Admitting: Cardiology

## 2020-12-29 IMAGING — DX DG CHEST 2V
2 series · 2 of 2 positions shown · non-contrast
Comparison: 07/28/2019

CLINICAL DATA: Status post CABG.

EXAM:
CHEST - 2 VIEW

[dg chest 2 view (1 of 2)]
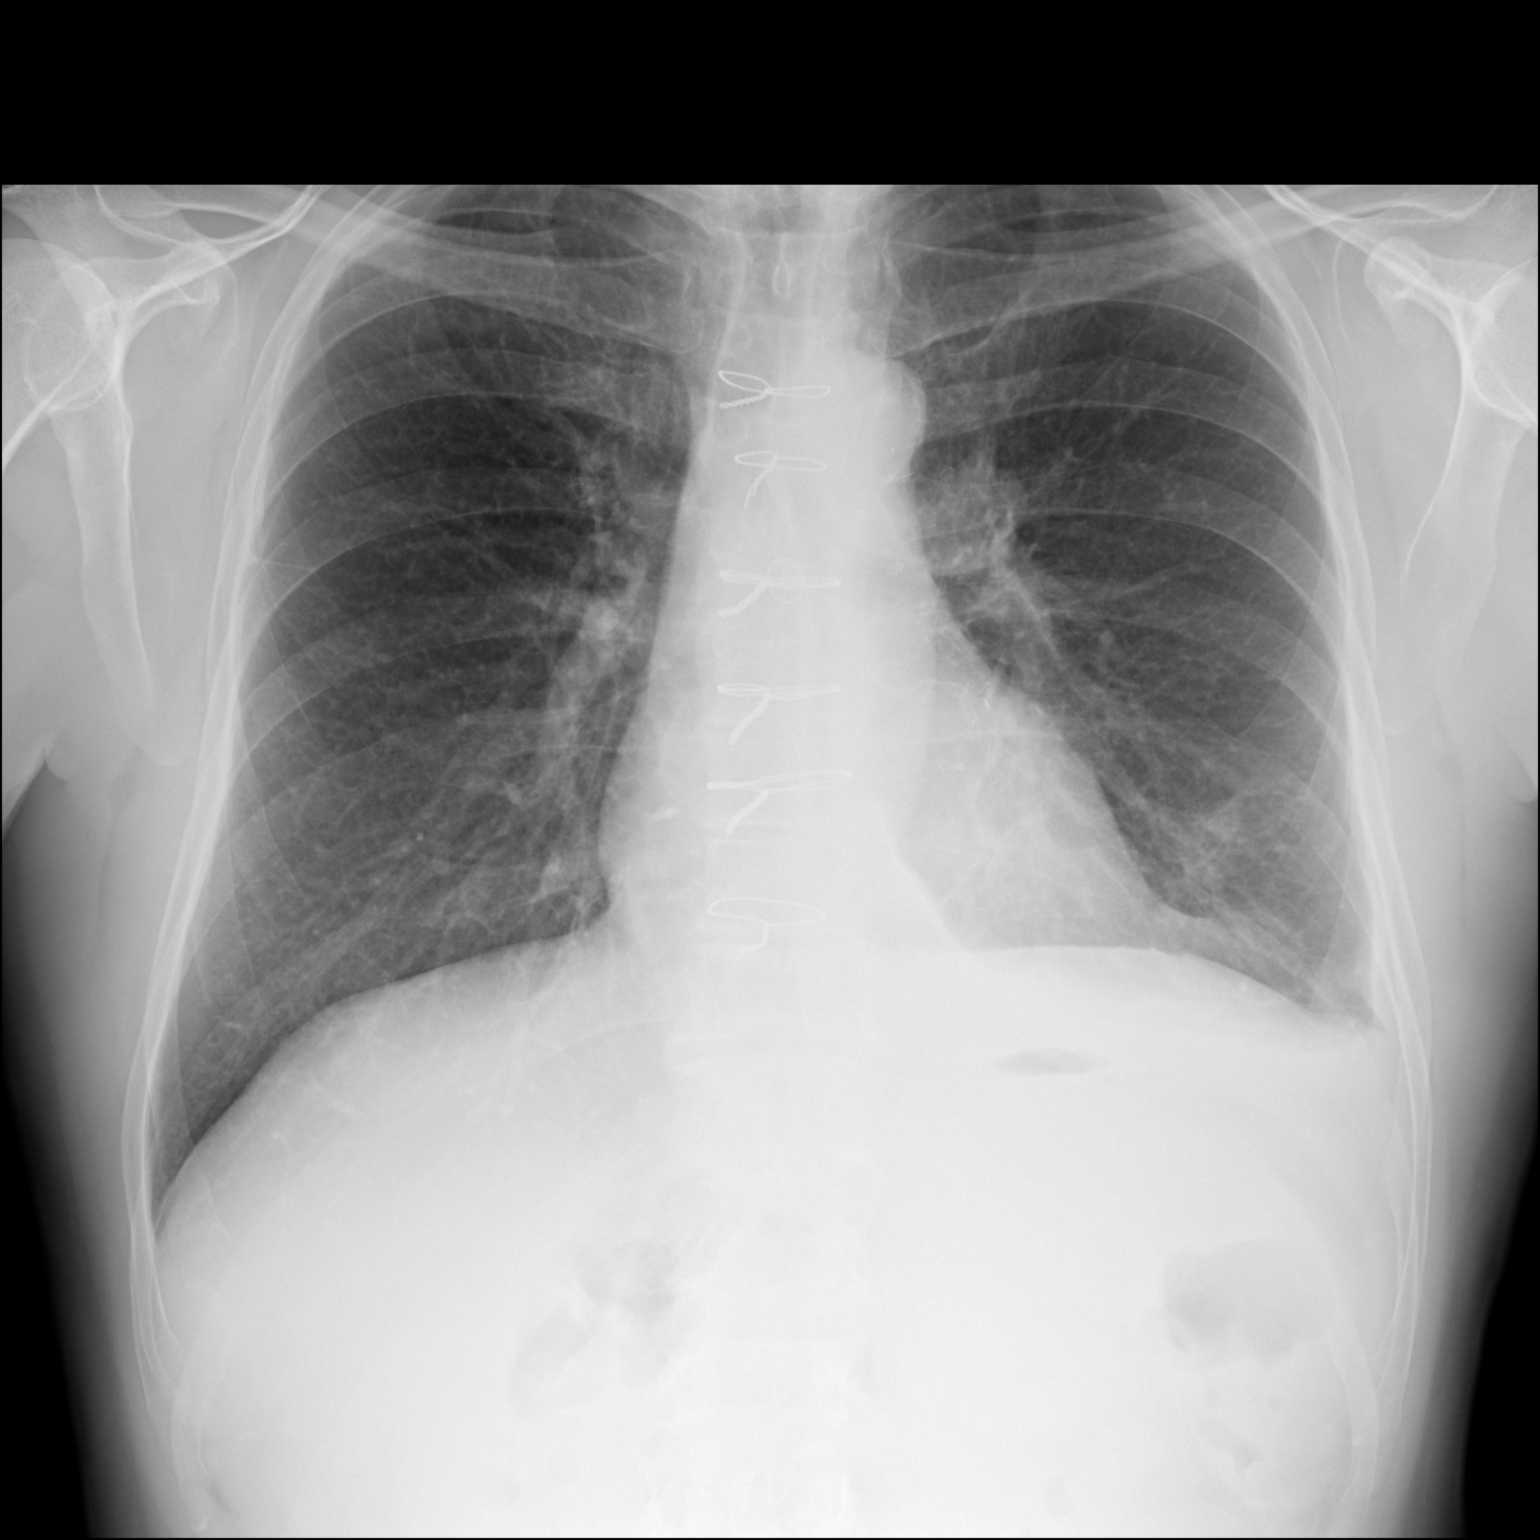

[dg chest 2 view (2 of 2)]
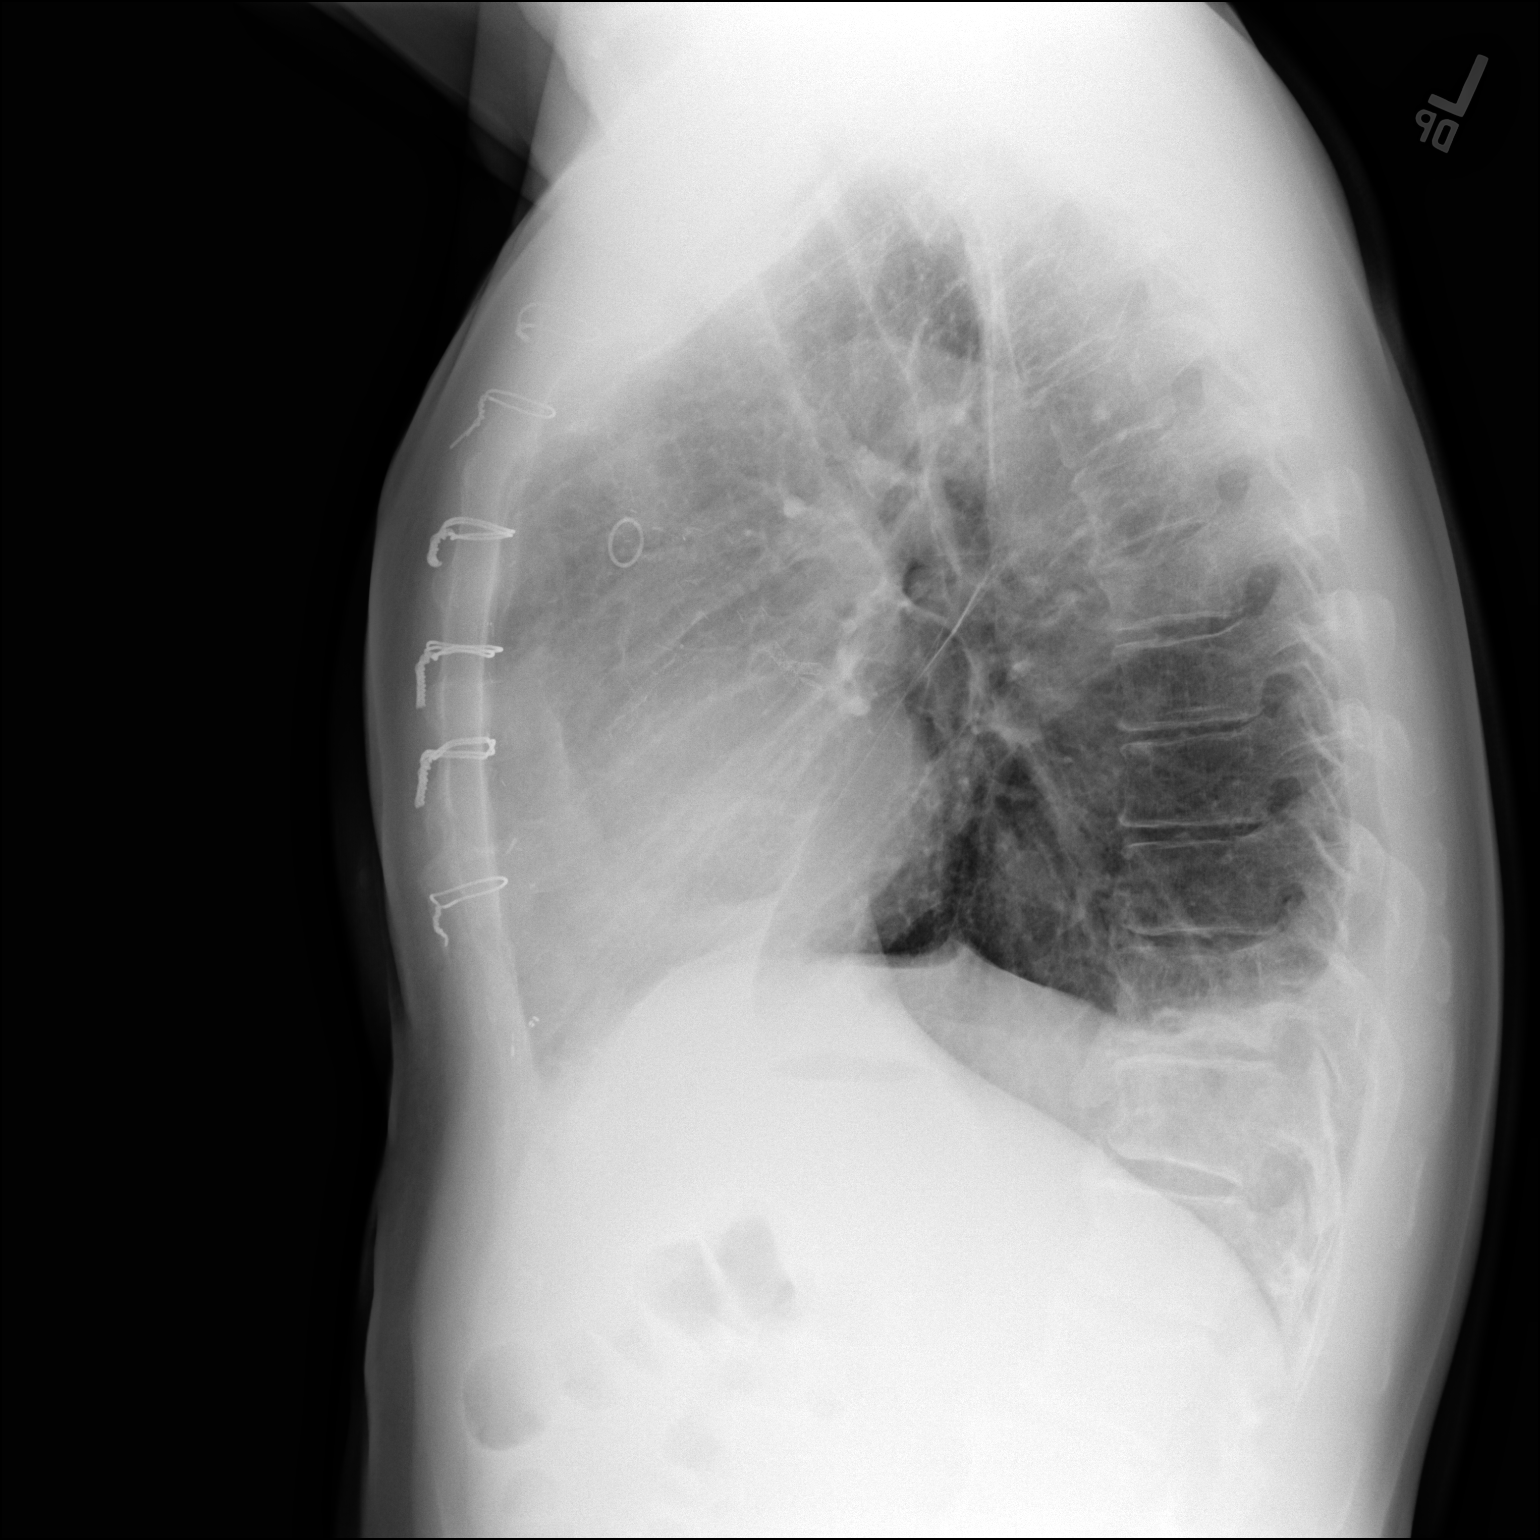

[2 of 2 positions shown; findings below may reference images not displayed]

FINDINGS: Right base atelectasis and tiny right pleural effusion of resolved
in the interval. There is some trace residual atelectasis or scar at
the left base with decrease in left pleural effusion, now small in
character. Lungs otherwise clear. The cardiopericardial silhouette
is within normal limits for size. The visualized bony structures of
the thorax are intact.
IMPRESSION: 1. Interval resolution of right base atelectasis and tiny right
pleural effusion.
2. Interval decrease in left pleural effusion with trace left base
atelectasis/scar.

## 2021-04-06 ENCOUNTER — Other Ambulatory Visit: Payer: Self-pay | Admitting: Cardiology

## 2021-04-06 DIAGNOSIS — E7841 Elevated Lipoprotein(a): Secondary | ICD-10-CM

## 2021-04-06 DIAGNOSIS — E785 Hyperlipidemia, unspecified: Secondary | ICD-10-CM

## 2021-04-06 DIAGNOSIS — Z951 Presence of aortocoronary bypass graft: Secondary | ICD-10-CM

## 2021-04-12 ENCOUNTER — Other Ambulatory Visit: Payer: Self-pay | Admitting: Cardiology

## 2021-04-21 ENCOUNTER — Other Ambulatory Visit: Payer: Self-pay | Admitting: Student

## 2021-04-21 DIAGNOSIS — Z951 Presence of aortocoronary bypass graft: Secondary | ICD-10-CM

## 2021-04-21 DIAGNOSIS — E785 Hyperlipidemia, unspecified: Secondary | ICD-10-CM

## 2021-05-19 ENCOUNTER — Ambulatory Visit: Payer: Commercial Managed Care - PPO | Admitting: Cardiology

## 2021-07-28 ENCOUNTER — Other Ambulatory Visit: Payer: Self-pay

## 2021-08-03 NOTE — Progress Notes (Unsigned)
Cardiology Office Note:    Date:  08/04/2021   ID:  Tyler Sweeney, DOB Feb 08, 1961, MRN [id number]  PCP:  Orpah Melter, MD  Cardiologist:  Shirlee More, MD    Referring MD: Orpah Melter, MD    ASSESSMENT:    1. Coronary artery disease involving native coronary artery of native heart without angina pectoris   2. Dyslipidemia, goal LDL below 70   3. Elevated Lp(a)    PLAN:    In order of problems listed above:  He continues to do well with CAD and CABG with graft failure at present having no angina continue treatment including aspirin oral nitrate and combined lipid-lowering with rosuvastatin and PCSK9 inhibitor with elevated LP(a).  He has had approximately 25% decrease. Continue his statin goal is LDL less than 50-55 recheck lipid profile CMP today   Next appointment: 1 year   Medication Adjustments/Labs and Tests Ordered: Current medicines are reviewed at length with the patient today.  Concerns regarding medicines are outlined above.  Orders Placed This Encounter  Procedures   Comprehensive Metabolic Panel (CMET)   Lipid Profile   Lipoprotein A (LPA)   EKG 12-Lead   No orders of the defined types were placed in this encounter.   Chief Complaint  Patient presents with   Follow-up   Hyperlipidemia   Coronary Artery Disease    History of Present Illness:    Tyler Sweeney is a 61 y.o. male with a hx of CAD with a history of previous PCI and stent and subsequent CABG 07/25/2019 hypertension hyperlipidemia elevated LP(a) last seen 12/01/2020.  His lipids have been treated with a combination of Praluent and high intensity statin.  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    Compliance with diet, lifestyle and medications: Yes he remains active in his good lifestyle  with regular activity and exercise He tolerates lipid-lowering without muscle pain or weakness He has had no cardiovascular symptoms of exercise intolerance edema shortness of breath chest pain palpitation or syncope Past Medical History:  Diagnosis Date   Anginal pain ([place])    Asthma    as a child   CAD (coronary artery disease) 11/24/2012   11/24/12 Cath nl LM, 99%LAD post D1, 100% 1rst OM with collaterals, 40% circumflex, no RCA dz, s/p DES LAD & CFX/OM1 Dr. Tamala Julian     CAD S/P PCI 2014-2015 11/24/2012   07/05/13 Cutting balloon of dirc distal to stent with good result.  Patent LAD and circ culotte stents, 80%prox circ distal to prior stent new,  Normal RCA  11/24/12 Cath normal LM, 99%LAD post diagonal, 100% 1rst OM with collaterals, 40% circumflex, no RCA Stent LAD 3.0 x 24 mm Promus  bifurcation stenting of circumflex 3.0 x 25 mm Promus post dilated to 3.25 and 2.75 x 20 Promus into circumflex po   Chest pain 07/05/2013   Dyslipidemia, goal LDL below 70 11/24/2012   GERD (gastroesophageal reflux disease) 05/08/2010   Hyperlipidemia 11/24/2012   Injury of left foot 08/14/2019   MI, old 2014   Diagnosis based on left ventricular dysfunction seen on stress test   Polycystic kidney disease    "dx'd but have never had any symptoms" (07/05/2013)   Renal insufficiency 02/13/2018   S/P appendectomy 09/19/2016   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  LIMA TO LAD,  RADIAL ARTERY TO OM1 AND OM2 SEQUENTIAL, SVG TO DIAGONAL.;  Surgeon: Melrose Nakayama, MD;  Location: [facility];  Service: Open Heart Surgery;  Laterality: N/A;   ENDOVEIN HARVEST OF GREATER SAPHENOUS VEIN Right 07/25/2019   Procedure: [place] Of Greater Saphenous Vein;  Surgeon: Melrose Nakayama, MD;  Location: [facility];  Service: Open Heart Surgery;  Laterality: Right;   FOOT SURGERY Left 1984   "cyst"   INGUINAL HERNIA REPAIR Right 1962   LAPAROSCOPIC APPENDECTOMY N/A 09/19/2016   Procedure: APPENDECTOMY LAPAROSCOPIC;  Surgeon: Ralene Ok, MD;  Location: [facility];  Service: General;  Laterality: N/A;   LEFT HEART CATH AND CORONARY ANGIOGRAPHY N/A 07/24/2019   Procedure: LEFT HEART CATH AND CORONARY ANGIOGRAPHY;  Surgeon: Burnell Blanks, MD;  Location: [facility];  Service: Cardiovascular;  Laterality: N/A;   LEFT HEART CATH AND CORS/GRAFTS ANGIOGRAPHY N/A 04/16/2020   Procedure: LEFT HEART CATH AND CORS/GRAFTS ANGIOGRAPHY;  Surgeon: Wellington Hampshire, MD;  Location: [facility];  Service: Cardiovascular;  Laterality: N/A;   LEFT HEART CATHETERIZATION WITH CORONARY ANGIOGRAM N/A 11/24/2012   Procedure: LEFT HEART CATHETERIZATION WITH CORONARY ANGIOGRAM;  Surgeon: Jacolyn Reedy, MD;  Location: [facility] CATH LAB;  Service: Cardiovascular;  Laterality: N/A;   LEFT HEART CATHETERIZATION WITH CORONARY ANGIOGRAM N/A 07/05/2013   Procedure: LEFT HEART CATHETERIZATION WITH CORONARY ANGIOGRAM;  Surgeon: Jacolyn Reedy, MD;  Location[facility] CATH LAB;  Service: Cardiovascular;  Laterality: N/A;   PERCUTANEOUS CORONARY STENT INTERVENTION (PCI-S)  11/24/2012   Procedure: PERCUTANEOUS CORONARY STENT INTERVENTION (PCI-S);  Surgeon: Jacolyn Reedy, MD;  Location: [facility] CATH LAB;  Service: Cardiovascular;;   PERCUTANEOUS CORONARY STENT INTERVENTION (PCI-S) Right 07/05/2013   Procedure: PERCUTANEOUS CORONARY STENT INTERVENTION (PCI-S);  Surgeon: Jacolyn Reedy, MD;  Location: [facility] CATH  LAB;  Service: Cardiovascular;  Laterality: Right;   TEE WITHOUT CARDIOVERSION N/A 07/25/2019   Procedure: TRANSESOPHAGEAL ECHOCARDIOGRAM (TEE);  Surgeon: Melrose Nakayama, MD;  Location: [place];  Service: Open Heart Surgery;  Laterality: N/A;    Current Medications: Current Meds  Medication Sig   Alirocumab (PRALUENT) 75 MG/ML SOAJ INJECT 1 ML INTO THE SKIN EVERY 14 (FOURTEEN) DAYS.   aspirin 81 MG chewable tablet Chew 1 tablet (81 mg total) by mouth daily.   Cholecalciferol (VITAMIN D3 PO) Take 1 tablet by mouth every 7 (seven) days.   famotidine (PEPCID) 10 MG tablet Take 10 mg by mouth 2 (two) times daily.   isosorbide mononitrate (IMDUR) 30 MG 24 hr tablet Take 1 tablet (30 mg total) by mouth daily.   Multiple Vitamins-Minerals (ZINC PO) Take 1 tablet by mouth once a week. Takes every 3-4 days   nitroGLYCERIN (NITROSTAT) 0.4 MG SL tablet PLACE 1 TABLET UNDER THE TONGUE EVERY 5 MINUTES AS NEEDED FOR CHEST PAIN   rosuvastatin (CRESTOR) 10 MG tablet TAKE 1 TABLET BY MOUTH EVERY DAY     Allergies:  Shellfish allergy, Gluten meal, Milk-related compounds, and Ramipril   Social History   Socioeconomic History   Marital status: Married    Spouse name: Not on file   Number of children: Not on file   Years of education: Not on file   Highest education level: Not on file  Occupational History   Occupation: PARKING MANAGER    Employer: [employer]  Tobacco Use   Smoking status: Never   Smokeless tobacco: Never  Vaping Use   Vaping Use: Never used  Substance and Sexual Activity   Alcohol use: No   Drug use: No   Sexual activity: Yes  Other Topics Concern   Not on file  Social History Narrative   Not on file   Social Determinants of Health   Financial Resource Strain: Not on file  Food Insecurity: Not on file  Transportation Needs: Not on file  Physical Activity: Not on file  Stress: Not on file  Social Connections: Not on file     Family History: The patient's family  history includes Coronary artery disease in an other family member; Heart attack (age of onset: 73) in his mother; Heart attack (age of onset: 66) in his father; Stroke (age of onset: 48) in his mother. ROS:   Please see the history of present illness.    All other systems reviewed and are negative.  EKGs/Labs/Other Studies Reviewed:    The following studies were reviewed today:  EKG:  EKG ordered today and personally reviewed.  The ekg ordered today demonstrates sinus rhythm old anterior septal MI unchanged 04/16/2020  Recent Labs: 12/01/2020: ALT 32; BUN 15; Creatinine, Ser 1.45; Potassium 4.6; Sodium 138  Recent Lipid Panel    Component Value Date/Time   CHOL 78 (L) 12/01/2020 0828   TRIG 68 12/01/2020 0828   HDL 49 12/01/2020 0828   CHOLHDL 1.6 12/01/2020 0828   CHOLHDL 2.3 07/24/2019 1946   VLDL 15 07/24/2019 1946   LDLCALC 14 12/01/2020 0828    Physical Exam:    VS:  BP 126/74   Pulse 62   Ht '5\' 11"'$  (1.803 m)   Wt 190 lb 1.3 oz (86.2 kg)   SpO2 99%   BMI 26.51 kg/m     Wt Readings from Last 3 Encounters:  08/04/21 190 lb 1.3 oz (86.2 kg)  12/01/20 184 lb (83.5 kg)  05/07/20 188 lb (85.3 kg)     GEN:  Well nourished, well developed in no acute distress HEENT: Normal NECK: No JVD; No carotid bruits LYMPHATICS: No lymphadenopathy CARDIAC: RRR, no murmurs, rubs, gallops RESPIRATORY:  Clear to auscultation without rales, wheezing or rhonchi  ABDOMEN: Soft, non-tender, non-distended MUSCULOSKELETAL:  No edema; No deformity  SKIN: Warm and dry NEUROLOGIC:  Alert and oriented x 3 PSYCHIATRIC:  Normal affect    Signed, Shirlee More, MD  08/04/2021 8:40 AM    [facility]

## 2021-08-04 ENCOUNTER — Ambulatory Visit: Payer: Commercial Managed Care - PPO | Admitting: Cardiology

## 2021-08-04 ENCOUNTER — Encounter: Payer: Self-pay | Admitting: Cardiology

## 2021-08-04 VITALS — BP 126/74 | HR 62 | Ht 71.0 in | Wt 190.1 lb

## 2021-08-04 DIAGNOSIS — E7841 Elevated Lipoprotein(a): Secondary | ICD-10-CM

## 2021-08-04 DIAGNOSIS — Z860100 Personal history of colon polyps, unspecified: Secondary | ICD-10-CM | POA: Insufficient documentation

## 2021-08-04 DIAGNOSIS — E785 Hyperlipidemia, unspecified: Secondary | ICD-10-CM

## 2021-08-04 DIAGNOSIS — I2510 Atherosclerotic heart disease of native coronary artery without angina pectoris: Secondary | ICD-10-CM

## 2021-08-04 DIAGNOSIS — I10 Essential (primary) hypertension: Secondary | ICD-10-CM | POA: Insufficient documentation

## 2021-08-04 NOTE — Patient Instructions (Signed)
Medication Instructions:  Your physician recommends that you continue on your current medications as directed. Please refer to the Current Medication list given to you today.  *If you need a refill on your cardiac medications before your next appointment, please call your pharmacy*   Lab Work: Your physician recommends that you return for lab work in:   Labs today at [facility]: CMP, Lipids, LPa  If you have labs (blood work) drawn today and your tests are completely normal, you will receive your results only by: MyChart Message (if you have [place]) OR A paper copy in the mail If you have any lab test that is abnormal or we need to change your treatment, we will call you to review the results.   Testing/Procedures: None   Follow-Up: At [facility], you and your health needs are our priority.  As part of our continuing mission to provide you with exceptional heart care, we have created designated Provider Care Teams.  These Care Teams include your primary Cardiologist (physician) and Advanced Practice Providers (APPs -  Physician Assistants and Nurse Practitioners) who all work together to provide you with the care you need, when you need it.  We recommend signing up for the patient portal called "MyChart".  Sign up information is provided on this After Visit Summary.  MyChart is used to connect with patients for Virtual Visits (Telemedicine).  Patients are able to view lab/test results, encounter notes, upcoming appointments, etc.  Non-urgent messages can be sent to your provider as well.   To learn more about what you can do with MyChart, go to [email or web address].    Your next appointment:   9 month(s)  The format for your next appointment:   In Person  Provider:   Shirlee More, MD    Other Instructions None  Important Information About Sugar

## 2021-08-05 LAB — COMPREHENSIVE METABOLIC PANEL
ALT: 17 IU/L (ref 0–44)
AST: 20 IU/L (ref 0–40)
Albumin/Globulin Ratio: 1.9 (ref 1.2–2.2)
Albumin: 4.7 g/dL (ref 3.8–4.8)
Alkaline Phosphatase: 89 IU/L (ref 44–121)
BUN/Creatinine Ratio: 12 (ref 10–24)
BUN: 18 mg/dL (ref 8–27)
Bilirubin Total: 0.8 mg/dL (ref 0.0–1.2)
CO2: 22 mmol/L (ref 20–29)
Calcium: 9.6 mg/dL (ref 8.6–10.2)
Chloride: 101 mmol/L (ref 96–106)
Creatinine, Ser: 1.54 mg/dL — ABNORMAL HIGH (ref 0.76–1.27)
Globulin, Total: 2.5 g/dL (ref 1.5–4.5)
Glucose: 85 mg/dL (ref 70–99)
Potassium: 4 mmol/L (ref 3.5–5.2)
Sodium: 137 mmol/L (ref 134–144)
Total Protein: 7.2 g/dL (ref 6.0–8.5)
eGFR: 51 mL/min/{1.73_m2} — ABNORMAL LOW (ref >59)

## 2021-08-05 LAB — LIPID PANEL
Chol/HDL Ratio: 1.8 ratio (ref 0.0–5.0)
Cholesterol, Total: 84 mg/dL — ABNORMAL LOW (ref 100–199)
HDL: 46 mg/dL (ref >39)
LDL Chol Calc (NIH): 19 mg/dL (ref 0–99)
Triglycerides: 99 mg/dL (ref 0–149)
VLDL Cholesterol Cal: 19 mg/dL (ref 5–40)

## 2021-08-05 LAB — LIPOPROTEIN A (LPA): Lipoprotein (a): 67.5 nmol/L (ref <75.0)

## 2021-09-14 ENCOUNTER — Encounter: Payer: Self-pay | Admitting: Cardiology

## 2021-09-24 ENCOUNTER — Telehealth: Payer: Self-pay

## 2021-09-24 ENCOUNTER — Other Ambulatory Visit: Payer: Self-pay

## 2021-09-24 NOTE — Telephone Encounter (Signed)
Called and completed a prior authorization for the medication Praluent for this patient. [facility] will send a response via fax after it is evaluated.

## 2021-10-02 MED ORDER — REPATHA SURECLICK 140 MG/ML ~~LOC~~ SOAJ: 1.0000 | SUBCUTANEOUS | 11 refills | Status: DC

## 2021-10-02 NOTE — Addendum Note (Signed)
Addended by: Jamel Holzmann E on: 10/02/2021 04:59 PM   Modules accepted: Orders

## 2021-10-09 ENCOUNTER — Other Ambulatory Visit: Payer: Self-pay | Admitting: Cardiology

## 2021-10-19 ENCOUNTER — Telehealth: Payer: Self-pay

## 2021-10-19 NOTE — Telephone Encounter (Signed)
Letter of approval for Repatha coverage received 10/01/2021 Patient approved from 09/30/2021 to 10/01/2022 Medication is filled through [place] Copy of letter scanned to patient's chart

## 2021-10-19 NOTE — Telephone Encounter (Signed)
Denial of initial request for coverage of Praluent received 09/28/2021.  Copy of letter scanned to patient chart

## 2021-11-16 ENCOUNTER — Other Ambulatory Visit: Payer: Self-pay | Admitting: Cardiology

## 2021-11-22 ENCOUNTER — Other Ambulatory Visit: Payer: Self-pay | Admitting: Thoracic Surgery (Cardiothoracic Vascular Surgery)

## 2021-11-23 ENCOUNTER — Encounter: Payer: Self-pay | Admitting: Cardiology

## 2021-11-27 ENCOUNTER — Other Ambulatory Visit: Payer: Self-pay

## 2021-11-27 MED ORDER — METOPROLOL SUCCINATE ER 50 MG PO TB24: 50.0000 mg | ORAL_TABLET | Freq: Every day | ORAL | 2 refills | Status: DC

## 2021-11-27 NOTE — Telephone Encounter (Signed)
Tyler Priest, MD  You 2 days ago    Please reconcile and send him his refill    You  Bettina Gavia Hilton Cork, MD 3 days ago    See below and advise    [facility] Triage (supporting Tyler Priest, MD) 4 days ago    Thank you for the quick answer.   It's Metropolol SUCC ER '50MG'$    Have a good week.   [name]   Per patient is taking Metoprolol Succ '50mg'$  daily. Sent per Dr. Bettina Gavia. Patient notified. Med list updated

## 2022-02-22 ENCOUNTER — Other Ambulatory Visit: Payer: Self-pay | Admitting: Cardiology

## 2022-03-04 ENCOUNTER — Encounter (HOSPITAL_COMMUNITY): Payer: Self-pay | Admitting: Emergency Medicine

## 2022-03-04 ENCOUNTER — Ambulatory Visit (HOSPITAL_COMMUNITY)
Admission: EM | Admit: 2022-03-04 | Discharge: 2022-03-04 | Disposition: A | Discharge status: Home / Self Care | Payer: Commercial Managed Care - PPO | Attending: Internal Medicine | Admitting: Internal Medicine

## 2022-03-04 DIAGNOSIS — R52 Pain, unspecified: Secondary | ICD-10-CM | POA: Diagnosis present

## 2022-03-04 DIAGNOSIS — R058 Other specified cough: Secondary | ICD-10-CM | POA: Diagnosis not present

## 2022-03-04 DIAGNOSIS — Z1152 Encounter for screening for COVID-19: Secondary | ICD-10-CM | POA: Diagnosis not present

## 2022-03-04 DIAGNOSIS — J45909 Unspecified asthma, uncomplicated: Secondary | ICD-10-CM | POA: Diagnosis not present

## 2022-03-04 DIAGNOSIS — J069 Acute upper respiratory infection, unspecified: Secondary | ICD-10-CM | POA: Diagnosis not present

## 2022-03-04 DIAGNOSIS — R509 Fever, unspecified: Secondary | ICD-10-CM | POA: Diagnosis present

## 2022-03-04 DIAGNOSIS — I25110 Atherosclerotic heart disease of native coronary artery with unstable angina pectoris: Secondary | ICD-10-CM | POA: Insufficient documentation

## 2022-03-04 DIAGNOSIS — Z951 Presence of aortocoronary bypass graft: Secondary | ICD-10-CM | POA: Insufficient documentation

## 2022-03-04 LAB — SARS CORONAVIRUS 2 (TAT 6-24 HRS): SARS Coronavirus 2: NEGATIVE

## 2022-03-04 MED ORDER — BENZONATATE 100 MG PO CAPS
100.0000 mg | ORAL_CAPSULE | Freq: Three times a day (TID) | ORAL | 0 refills | Status: DC
Start: 2022-03-04 — End: 2022-06-07

## 2022-03-04 MED ORDER — ONDANSETRON 4 MG PO TBDP: 4.0000 mg | ORAL_TABLET | Freq: Three times a day (TID) | ORAL | 0 refills | Status: DC | PRN

## 2022-03-04 MED ORDER — PROMETHAZINE-DM 6.25-15 MG/5ML PO SYRP: 5.0000 mL | ORAL_SOLUTION | Freq: Every evening | ORAL | 0 refills | Status: DC | PRN

## 2022-03-04 NOTE — ED Triage Notes (Signed)
Since Monday having body aches, fever, cough that is productive and congestion. Using expectorant, Nyquil and Dayquil.

## 2022-03-04 NOTE — Discharge Instructions (Addendum)
You have a viral upper respiratory infection.  COVID-19 testing is pending. We will call you with results if positive. If your COVID test is positive, you must stay at home until day 6 of symptoms. On day 6, you may go out into public and go back to work, but you must wear a mask until day 11 of symptoms to prevent spread to others.  Purchase mucinex (guaifenesin) '1200mg'$  and take this every 12 hours for the next few days to thin your nasal congestion and mucous so that you are able to get out of your body easier by coughing and blowing your nose. Drink plenty of water while taking this.  Take Promethazine DM cough medication to help with your cough at nighttime so that you are able to sleep. Do not drive, drink alcohol, or go to work while taking this medication since it can make you sleepy. Only take this at nighttime.   Take tessalon pearles every 8 hours as needed for cough.  You may take tylenol 1,'000mg'$  every 6 hours as needed for fever, chills, and aches and pains.  You may do salt water and baking soda gargles every 4 hours as needed for your throat pain.  Please put 1 teaspoon of salt and 1/2 teaspoon of baking soda in 8 ounces of warm water then gargle and spit the water out. You may also put 1 tablespoon of honey in warm water and drink this to soothe your throat.  Place a humidifier in your room at night to help decrease dry air that can irritate your airway and cause you to have a sore throat and cough.  Please try to eat a well-balanced diet while you are sick so that your body gets proper nutrition to heal.  If you develop any new or worsening symptoms, please return.  If your symptoms are severe, please go to the emergency room.  Follow-up with your primary care provider for further evaluation and management of your symptoms as well as ongoing wellness visits.  I hope you feel better!

## 2022-03-04 NOTE — ED Notes (Signed)
Exposed to RSV and Flu.

## 2022-03-04 NOTE — ED Provider Notes (Signed)
[place]    CSN: [id number] Arrival date & time: 03/04/22  1013      History   Chief Complaint Chief Complaint  Patient presents with   Generalized Body Aches   Fever   Cough    HPI Tyler Sweeney is a 61 y.o. male.   Patient presents to urgent care for evaluation of cough, body aches, nasal congestion, high fever, and generalized fatigue for the last 3 days. Reports fever/chills. Denies shortness of breath, chest pain, heart palpitations, nausea, vomiting, dizziness, diarrhea, abdominal pain, and syncope. States he had asthma as a child but has not experienced asthma symptoms since. He is vaccinated against COVID-19 and influenza. Reports sick contacts with similar symptoms. Using OTC meds without relief of symptoms.    Fever Associated symptoms: cough   Cough Associated symptoms: fever     Past Medical History:  Diagnosis Date   Anginal pain ([place])    Asthma    as a child   CAD (coronary artery disease) 11/24/2012   11/24/12 Cath nl LM, 99%LAD post D1, 100% 1rst OM with collaterals, 40% circumflex, no RCA dz, s/p DES LAD & CFX/OM1 Dr. Tamala Julian     CAD S/P PCI 2014-2015 11/24/2012   07/05/13 Cutting balloon of dirc distal to stent with good result.  Patent LAD and circ culotte stents, 80%prox circ distal to prior stent new,  Normal RCA  11/24/12 Cath normal LM, 99%LAD post diagonal, 100% 1rst OM with collaterals, 40% circumflex, no RCA Stent LAD 3.0 x 24 mm Promus  bifurcation stenting of circumflex 3.0 x 25 mm Promus post dilated to 3.25 and 2.75 x 20 Promus into circumflex po   Chest pain 07/05/2013   Dyslipidemia, goal LDL below 70 11/24/2012   GERD (gastroesophageal reflux disease) 05/08/2010   Hyperlipidemia 11/24/2012   Injury of left foot 08/14/2019   MI, old 2014   Diagnosis based on left ventricular dysfunction seen on stress test   Polycystic kidney disease    "dx'd but have never had any symptoms" (07/05/2013)   Renal insufficiency 02/13/2018   S/P  appendectomy 09/19/2016   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   L-LAD, LRA-OM1-OM2, S-PDA 07/24/2019   Unstable angina (HCC) 07/05/2013   Vertigo     Patient Active Problem List   Diagnosis Date Noted   Benign essential hypertension 08/04/2021   Personal history of colonic polyps 08/04/2021   Polycystic kidney disease    Asthma    Anginal pain ([place])    Injury of left foot 08/14/2019   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   Renal insufficiency 02/13/2018   S/P appendectomy 09/19/2016   Chest pain 07/05/2013   Unstable angina ([place]) 07/05/2013   Vertigo    CAD S/P PCI 2014-2015 11/24/2012   Dyslipidemia, goal LDL below 70 11/24/2012   Hyperlipidemia 11/24/2012   CAD (coronary artery disease) 11/24/2012   MI, old 2014   GERD (gastroesophageal reflux disease) 05/08/2010    Past Surgical History:  Procedure Laterality Date   CARDIAC CATHETERIZATION  04/16/2020   COLONOSCOPY  11/02/2011   Procedure: COLONOSCOPY;  Surgeon: Winfield Cunas., MD;  Location: [facility];  Service: Endoscopy;  Laterality: N/A;   CORONARY ANGIOPLASTY  07/05/2013   CORONARY ANGIOPLASTY WITH STENT PLACEMENT  11/24/2012   "3 stents"   CORONARY ARTERY BYPASS GRAFT N/A 07/25/2019   Procedure: CORONARY ARTERY BYPASS GRAFTING (CABG) x4 WITH LEFT RADIAL ARTERY HARVEST.  LIMA TO LAD, RADIAL ARTERY TO OM1 AND OM2 SEQUENTIAL, SVG TO DIAGONAL.;  Surgeon: Melrose Nakayama, MD;  Location: [facility];  Service: Open Heart Surgery;  Laterality: N/A;   ENDOVEIN HARVEST OF GREATER SAPHENOUS VEIN Right 07/25/2019   Procedure: [place] Of Greater Saphenous Vein;  Surgeon: Melrose Nakayama, MD;  Location: [facility];  Service: Open Heart Surgery;  Laterality: Right;   FOOT SURGERY Left 1984   "cyst"   INGUINAL HERNIA REPAIR Right 1962   LAPAROSCOPIC APPENDECTOMY N/A 09/19/2016   Procedure: APPENDECTOMY LAPAROSCOPIC;  Surgeon: Ralene Ok, MD;  Location: [facility];  Service: General;  Laterality: N/A;   LEFT HEART CATH AND CORONARY ANGIOGRAPHY  N/A 07/24/2019   Procedure: LEFT HEART CATH AND CORONARY ANGIOGRAPHY;  Surgeon: Burnell Blanks, MD;  Location: [facility];  Service: Cardiovascular;  Laterality: N/A;   LEFT HEART CATH AND CORS/GRAFTS ANGIOGRAPHY N/A 04/16/2020   Procedure: LEFT HEART CATH AND CORS/GRAFTS ANGIOGRAPHY;  Surgeon: Wellington Hampshire, MD;  Location: [facility];  Service: Cardiovascular;  Laterality: N/A;   LEFT HEART CATHETERIZATION WITH CORONARY ANGIOGRAM N/A 11/24/2012   Procedure: LEFT HEART CATHETERIZATION WITH CORONARY ANGIOGRAM;  Surgeon: Jacolyn Reedy, MD;  Location: [facility] CATH LAB;  Service: Cardiovascular;  Laterality: N/A;   LEFT HEART CATHETERIZATION WITH CORONARY ANGIOGRAM N/A 07/05/2013   Procedure: LEFT HEART CATHETERIZATION WITH CORONARY ANGIOGRAM;  Surgeon: Jacolyn Reedy, MD;  Location: [facility] CATH LAB;  Service: Cardiovascular;  Laterality: N/A;   PERCUTANEOUS CORONARY STENT INTERVENTION (PCI-S)  11/24/2012   Procedure: PERCUTANEOUS CORONARY STENT INTERVENTION (PCI-S);  Surgeon: Jacolyn Reedy, MD;  Location: [facility] CATH LAB;  Service: Cardiovascular;;   PERCUTANEOUS CORONARY STENT INTERVENTION (PCI-S) Right 07/05/2013   Procedure: PERCUTANEOUS CORONARY STENT INTERVENTION (PCI-S);  Surgeon: Jacolyn Reedy, MD;  Location: [facility] CATH LAB;  Service: Cardiovascular;  Laterality: Right;   TEE WITHOUT CARDIOVERSION N/A 07/25/2019   Procedure: TRANSESOPHAGEAL ECHOCARDIOGRAM (TEE);  Surgeon: Melrose Nakayama, MD;  Location: [facility];  Service: Open Heart Surgery;  Laterality: N/A;       Home Medications    Prior to Admission medications   Medication Sig Start Date End Date Taking? Authorizing Provider  benzonatate (TESSALON) 100 MG capsule Take 1 capsule (100 mg total) by mouth every 8 (eight) hours. 03/04/22  Yes Talbot Grumbling, FNP  promethazine-dextromethorphan (PROMETHAZINE-DM) 6.25-15 MG/5ML syrup Take 5 mLs by mouth at bedtime as needed for cough. 03/04/22  Yes Talbot Grumbling, FNP  aspirin 81 MG chewable tablet Chew 1 tablet (81 mg total) by mouth daily. 11/25/12   Jerline Pain, MD  Cholecalciferol (VITAMIN D3 PO) Take 1 tablet by mouth every 7 (seven) days.    [provider]  Evolocumab (REPATHA SURECLICK) 259 MG/ML SOAJ Inject 1 Pen into the skin every 14 (fourteen) days. 10/02/21   Richardo Priest, MD  famotidine (PEPCID) 10 MG tablet Take 10 mg by mouth 2 (two) times daily.    [provider]  isosorbide mononitrate (IMDUR) 30 MG 24 hr tablet TAKE 1 TABLET BY MOUTH EVERY DAY 10/09/21   Richardo Priest, MD  metoprolol succinate (TOPROL-XL) 50 MG 24 hr tablet TAKE 1 TABLET BY MOUTH DAILY. TAKE WITH OR IMMEDIATELY FOLLOWING A MEAL. 02/22/22   Richardo Priest, MD  Multiple Vitamins-Minerals (ZINC PO) Take 1 tablet by mouth once a week. Takes every 3-4 days    [provider]  nitroGLYCERIN (NITROSTAT) 0.4 MG SL tablet PLACE 1 TABLET UNDER THE TONGUE EVERY 5 MINUTES AS NEEDED FOR CHEST PAIN 12/16/20  Richardo Priest, MD  rosuvastatin (CRESTOR) 10 MG tablet TAKE 1 TABLET BY MOUTH EVERY DAY 11/16/21   Richardo Priest, MD    Family History Family History  Problem Relation Age of Onset   Coronary artery disease Other    Heart attack Father 62   Heart attack Mother 22   Stroke Mother 61    Social History Social History   Tobacco Use   Smoking status: Never   Smokeless tobacco: Never  Vaping Use   Vaping Use: Never used  Substance Use Topics   Alcohol use: No   Drug use: No     Allergies   Shellfish allergy, Gluten meal, Milk-related compounds, and Ramipril   Review of Systems Review of Systems  Constitutional:  Positive for fever.  Respiratory:  Positive for cough.   Per HPI   Physical Exam Triage Vital Signs ED Triage Vitals  Enc Vitals Group     BP 03/04/22 1207 (!) 142/95     Pulse Rate 03/04/22 1207 90     Resp 03/04/22 1207 17     Temp 03/04/22 1207 99.1 F (37.3 C)     Temp src --      SpO2  03/04/22 1207 98 %     Weight --      Height --      Head Circumference --      Peak Flow --      Pain Score 03/04/22 1205 4     Pain Loc --      Pain Edu? --      Excl. in [place]? --    No data found.  Updated Vital Signs BP (!) 142/95 (BP Location: Right Arm)   Pulse 90   Temp 99.1 F (37.3 C)   Resp 17   SpO2 98%   Visual Acuity Right Eye Distance:   Left Eye Distance:   Bilateral Distance:    Right Eye Near:   Left Eye Near:    Bilateral Near:     Physical Exam Vitals and nursing note reviewed.  Constitutional:      Appearance: He is not ill-appearing or toxic-appearing.  HENT:     Head: Normocephalic and atraumatic.     Right Ear: Hearing, tympanic membrane, ear canal and external ear normal.     Left Ear: Hearing, tympanic membrane, ear canal and external ear normal.     Nose: Congestion present.     Mouth/Throat:     Lips: Pink.     Mouth: Mucous membranes are moist.     Pharynx: No posterior oropharyngeal erythema.  Eyes:     General: Lids are normal. Vision grossly intact. Gaze aligned appropriately.        Right eye: No discharge.        Left eye: No discharge.     Extraocular Movements: Extraocular movements intact.     Conjunctiva/sclera: Conjunctivae normal.  Cardiovascular:     Rate and Rhythm: Normal rate and regular rhythm.     Heart sounds: Normal heart sounds, S1 normal and S2 normal.  Pulmonary:     Effort: Pulmonary effort is normal. No respiratory distress.     Breath sounds: Normal breath sounds and air entry.  Abdominal:     General: Bowel sounds are normal.     Palpations: Abdomen is soft.     Tenderness: There is no abdominal tenderness. There is no right CVA tenderness, left CVA tenderness or guarding.  Musculoskeletal:     Cervical  back: Neck supple.     Right lower leg: No edema.     Left lower leg: No edema.  Lymphadenopathy:     Cervical: Cervical adenopathy present.  Skin:    General: Skin is warm and dry.     Capillary  Refill: Capillary refill takes less than 2 seconds.     Findings: No rash.  Neurological:     General: No focal deficit present.     Mental Status: He is alert and oriented to person, place, and time. Mental status is at baseline.     Cranial Nerves: No dysarthria or facial asymmetry.     Motor: No weakness.     Gait: Gait normal.  Psychiatric:        Mood and Affect: Mood normal.        Speech: Speech normal.        Behavior: Behavior normal.        Thought Content: Thought content normal.        Judgment: Judgment normal.      UC Treatments / Results  Labs (all labs ordered are listed, but only abnormal results are displayed) Labs Reviewed  SARS CORONAVIRUS 2 (TAT 6-24 HRS)    EKG   Radiology No results found.  Procedures Procedures (including critical care time)  Medications Ordered in UC Medications - No data to display  Initial Impression / Assessment and Plan / UC Course  I have reviewed the triage vital signs and the nursing notes.  Pertinent labs & imaging results that were available during my care of the patient were reviewed by me and considered in my medical decision making (see chart for details).   1. Viral URI with cough Symptoms and physical exam consistent with a viral upper respiratory tract infection that will likely resolve with rest, fluids, and prescriptions for symptomatic relief. No indication for imaging today based on stable cardiopulmonary exam and hemodynamically stable vital signs. COVID-19 testing is pending.  We will call patient if this is positive.  Quarantine guidelines discussed. Currently on day 4 of symptoms and does qualify for antiviral therapy.   Tessalon Perles and promethazine DM sent to pharmacy for symptomatic relief to be taken as prescribed. Promethazine DM cough medication may be used as needed only at bedtime due to possible drowsiness side effect (no alcohol, working, or driving while taking this advised). Purchase mucinex  to take for nasal congestion and cough. May use ibuprofen/tylenol over the counter for body aches, fever/chills, and overall discomfort associated with viral illness. Nonpharmacologic interventions for symptom relief provided and after visit summary below.   Strict ED/urgent care return precautions given.  Patient verbalizes understanding and agreement with plan.  Counseled patient regarding possible side effects and uses of all medications prescribed at today's visit.  Patient verbalizes understanding and agreement with plan.  All questions answered.  Patient discharged from urgent care in stable condition.        Final Clinical Impressions(s) / UC Diagnoses   Final diagnoses:  Viral URI with cough  Fever, unspecified fever cause  Body aches     Discharge Instructions      You have a viral upper respiratory infection.  COVID-19 testing is pending. We will call you with results if positive. If your COVID test is positive, you must stay at home until day 6 of symptoms. On day 6, you may go out into public and go back to work, but you must wear a mask until day 11 of symptoms  to prevent spread to others.  Purchase mucinex (guaifenesin) '1200mg'$  and take this every 12 hours for the next few days to thin your nasal congestion and mucous so that you are able to get out of your body easier by coughing and blowing your nose. Drink plenty of water while taking this.  Take Promethazine DM cough medication to help with your cough at nighttime so that you are able to sleep. Do not drive, drink alcohol, or go to work while taking this medication since it can make you sleepy. Only take this at nighttime.   Take tessalon pearles every 8 hours as needed for cough.  You may take tylenol 1,'000mg'$  every 6 hours as needed for fever, chills, and aches and pains.  You may do salt water and baking soda gargles every 4 hours as needed for your throat pain.  Please put 1 teaspoon of salt and 1/2 teaspoon of  baking soda in 8 ounces of warm water then gargle and spit the water out. You may also put 1 tablespoon of honey in warm water and drink this to soothe your throat.  Place a humidifier in your room at night to help decrease dry air that can irritate your airway and cause you to have a sore throat and cough.  Please try to eat a well-balanced diet while you are sick so that your body gets proper nutrition to heal.  If you develop any new or worsening symptoms, please return.  If your symptoms are severe, please go to the emergency room.  Follow-up with your primary care provider for further evaluation and management of your symptoms as well as ongoing wellness visits.  I hope you feel better!     ED Prescriptions     Medication Sig Dispense Auth. Provider   promethazine-dextromethorphan (PROMETHAZINE-DM) 6.25-15 MG/5ML syrup Take 5 mLs by mouth at bedtime as needed for cough. 118 mL Joella Prince M, FNP   benzonatate (TESSALON) 100 MG capsule Take 1 capsule (100 mg total) by mouth every 8 (eight) hours. 21 capsule Talbot Grumbling, FNP      PDMP not reviewed this encounter.   Talbot Grumbling, [place] 03/07/22 1934

## 2022-03-27 ENCOUNTER — Emergency Department (HOSPITAL_COMMUNITY): Payer: Commercial Managed Care - PPO

## 2022-03-27 ENCOUNTER — Emergency Department (HOSPITAL_COMMUNITY)
Admission: EM | Admit: 2022-03-27 | Discharge: 2022-03-28 | Disposition: A | Discharge status: Home / Self Care | Payer: Commercial Managed Care - PPO | Attending: Emergency Medicine | Admitting: Emergency Medicine

## 2022-03-27 ENCOUNTER — Other Ambulatory Visit (HOSPITAL_COMMUNITY): Payer: Commercial Managed Care - PPO

## 2022-03-27 ENCOUNTER — Other Ambulatory Visit: Payer: Self-pay

## 2022-03-27 DIAGNOSIS — I2510 Atherosclerotic heart disease of native coronary artery without angina pectoris: Secondary | ICD-10-CM | POA: Diagnosis not present

## 2022-03-27 DIAGNOSIS — I10 Essential (primary) hypertension: Secondary | ICD-10-CM | POA: Insufficient documentation

## 2022-03-27 DIAGNOSIS — R1013 Epigastric pain: Secondary | ICD-10-CM | POA: Insufficient documentation

## 2022-03-27 DIAGNOSIS — R079 Chest pain, unspecified: Secondary | ICD-10-CM | POA: Diagnosis present

## 2022-03-27 DIAGNOSIS — Z951 Presence of aortocoronary bypass graft: Secondary | ICD-10-CM | POA: Diagnosis not present

## 2022-03-27 LAB — CBC WITH DIFFERENTIAL/PLATELET
Abs Immature Granulocytes: 0.02 10*3/uL (ref 0.00–0.07)
Basophils Absolute: 0.1 10*3/uL (ref 0.0–0.1)
Basophils Relative: 1 %
Eosinophils Absolute: 0.2 10*3/uL (ref 0.0–0.5)
Eosinophils Relative: 3 %
HCT: 38.4 % — ABNORMAL LOW (ref 39.0–52.0)
Hemoglobin: 13.6 g/dL (ref 13.0–17.0)
Immature Granulocytes: 0 %
Lymphocytes Relative: 22 %
Lymphs Abs: 1.6 10*3/uL (ref 0.7–4.0)
MCH: 32.1 pg (ref 26.0–34.0)
MCHC: 35.4 g/dL (ref 30.0–36.0)
MCV: 90.6 fL (ref 80.0–100.0)
Monocytes Absolute: 1.1 10*3/uL — ABNORMAL HIGH (ref 0.1–1.0)
Monocytes Relative: 14 %
Neutro Abs: 4.6 10*3/uL (ref 1.7–7.7)
Neutrophils Relative %: 60 %
Platelets: 226 10*3/uL (ref 150–400)
RBC: 4.24 MIL/uL (ref 4.22–5.81)
RDW: 12.6 % (ref 11.5–15.5)
WBC: 7.5 10*3/uL (ref 4.0–10.5)
nRBC: 0 % (ref 0.0–0.2)

## 2022-03-27 LAB — TROPONIN I (HIGH SENSITIVITY): Troponin I (High Sensitivity): 5 ng/L (ref <18)

## 2022-03-27 LAB — BASIC METABOLIC PANEL
Anion gap: 9 (ref 5–15)
BUN: 12 mg/dL (ref 8–23)
CO2: 21 mmol/L — ABNORMAL LOW (ref 22–32)
Calcium: 8.9 mg/dL (ref 8.9–10.3)
Chloride: 102 mmol/L (ref 98–111)
Creatinine, Ser: 1.29 mg/dL — ABNORMAL HIGH (ref 0.61–1.24)
GFR, Estimated: >60 mL/min (ref >60)
Glucose, Bld: 91 mg/dL (ref 70–99)
Potassium: 3.7 mmol/L (ref 3.5–5.1)
Sodium: 132 mmol/L — ABNORMAL LOW (ref 135–145)

## 2022-03-27 NOTE — ED Triage Notes (Signed)
Pt via POV c/o chest pain and HTN that started today. Pt states centralized and right sided sharp chest pain. Pain improves with rest. Denies SOB. HTN 170s/100s. Endorses cardiac hx.

## 2022-03-27 NOTE — ED Provider Triage Note (Signed)
Emergency Medicine Provider Triage Evaluation Note  Tyler Sweeney , a 62 y.o. male  was evaluated in triage.  Pt complains of chest pain and HTN that began today.  Pain central and towards the right, somewhat down into right arm.  Deneis SOB.  Hx CAD, CABG x4.  Followed by cardiology, Dr. Bettina Gavia.    Review of Systems  Positive: Chest pain, HTN Negative: fever  Physical Exam  BP (!) 179/102   Pulse 66   Temp 97.9 F (36.6 C)   Resp 20   Ht '5\' 11"'$  (1.803 m)   Wt 86.2 kg   SpO2 100%   BMI 26.50 kg/m   Gen:   Awake, no distress   Resp:  Normal effort  MSK:   Moves extremities without difficulty  Other:    Medical Decision Making  Medically screening exam initiated at 10:45 PM.  Appropriate orders placed.  Tyler Sweeney was informed that the remainder of the evaluation will be completed by another provider, this initial triage assessment does not replace that evaluation, and the importance of remaining in the ED until their evaluation is complete.  Chest pain, HTN.  BP 179/102 in triage.  EKG, labs, CXR.   Tyler Pickett, PA-C 03/27/22 2247

## 2022-03-28 ENCOUNTER — Emergency Department (HOSPITAL_COMMUNITY): Payer: Commercial Managed Care - PPO

## 2022-03-28 ENCOUNTER — Encounter (HOSPITAL_COMMUNITY): Payer: Self-pay

## 2022-03-28 LAB — TROPONIN I (HIGH SENSITIVITY): Troponin I (High Sensitivity): 5 ng/L (ref <18)

## 2022-03-28 MED ORDER — FAMOTIDINE IN NACL 20-0.9 MG/50ML-% IV SOLN
20.0000 mg | Freq: Once | INTRAVENOUS | Status: AC
  Administered 2022-03-28: 20 mg via INTRAVENOUS
  Filled 2022-03-28: qty 50

## 2022-03-28 MED ORDER — MORPHINE SULFATE (PF) 4 MG/ML IV SOLN
2.0000 mg | Freq: Once | INTRAVENOUS | Status: AC
  Administered 2022-03-28: 2 mg via INTRAVENOUS
  Filled 2022-03-28: qty 1

## 2022-03-28 MED ORDER — SODIUM CHLORIDE 0.9 % IV BOLUS
1000.0000 mL | Freq: Once | INTRAVENOUS | Status: AC
  Administered 2022-03-28: 1000 mL via INTRAVENOUS

## 2022-03-28 MED ORDER — IOHEXOL 350 MG/ML SOLN
100.0000 mL | Freq: Once | INTRAVENOUS | Status: AC | PRN
  Administered 2022-03-28: 100 mL via INTRAVENOUS

## 2022-03-28 NOTE — ED Notes (Signed)
Patient transported to CT 

## 2022-03-28 NOTE — ED Provider Notes (Signed)
[facility] Emergency Department Provider Note MRN:  [id number]  Arrival date & time: 03/28/22     Chief Complaint   Chest Pain   History of Present Illness   Tyler Sweeney is a 62 y.o. year-old male presents to the ED with chief complaint of  Pt complains of chest pain and HTN that began today.  Pain central and towards the right, somewhat down into right arm.  Deneis SOB.  Hx CAD, CABG x4.  Followed by cardiology, Dr. Bettina Gavia.  He states that his symptoms feel like gas pains, but with his cardiac history he wanted to be evaluated.  History provided by patient.   Review of Systems  Pertinent positive and negative review of systems noted in HPI.    Physical Exam   Vitals:   03/28/22 0330 03/28/22 0524  BP: (!) 138/96 138/89  Pulse: 62 62  Resp: 14 14  Temp:  97.6 F (36.4 C)  SpO2: 99% 100%    CONSTITUTIONAL:  well-appearing, NAD NEURO:  Alert and oriented x 3, CN 3-12 grossly intact EYES:  eyes equal and reactive ENT/NECK:  Supple, no stridor  CARDIO:  normal rate, regular rhythm, appears well-perfused, intact distal pulses PULM:  No respiratory distress, CTAB GI/GU:  non-distended, non-tender MSK/SPINE:  No gross deformities, no edema, moves all extremities  SKIN:  no rash, atraumatic   *Additional and/or pertinent findings included in MDM below  Diagnostic and Interventional Summary    EKG Interpretation  Date/Time:  Saturday March 27 2022 22:49:53 EST Ventricular Rate:  67 PR Interval:  184 QRS Duration: 92 QT Interval:  414 QTC Calculation: 437 R Axis:   35 Text Interpretation: Normal sinus rhythm Cannot rule out Inferior infarct , age undetermined Anterior infarct , age undetermined Abnormal ECG When compared with ECG of 16-Apr-2020 04:56, PREVIOUS ECG IS PRESENT q waves more prominent in inferior leads but may be lead placement st elevation in inferior leads similar to 2/22 TW in V2 upright now Confirmed by Mesner, Corene Cornea [phone])  on 03/28/2022 1:17:07 AM       Labs Reviewed  CBC WITH DIFFERENTIAL/PLATELET - Abnormal; Notable for the following components:      Result Value   HCT 38.4 (*)    Monocytes Absolute 1.1 (*)    All other components within normal limits  BASIC METABOLIC PANEL - Abnormal; Notable for the following components:   Sodium 132 (*)    CO2 21 (*)    Creatinine, Ser 1.29 (*)    All other components within normal limits  TROPONIN I (HIGH SENSITIVITY)  TROPONIN I (HIGH SENSITIVITY)    CT Angio Chest/Abd/Pel for Dissection W and/or Wo Contrast  Final Result    DG Chest 2 View  Final Result      Medications  sodium chloride 0.9 % bolus 1,000 mL (0 mLs Intravenous Stopped 03/28/22 0338)  famotidine (PEPCID) IVPB 20 mg premix (0 mg Intravenous Stopped 03/28/22 0337)  morphine (PF) 4 MG/ML injection 2 mg (2 mg Intravenous Given 03/28/22 0247)  iohexol (OMNIPAQUE) 350 MG/ML injection 100 mL (100 mLs Intravenous Contrast Given 03/28/22 0407)     Procedures  /  Critical Care Procedures  ED Course and Medical Decision Making  I have reviewed the triage vital signs, the nursing notes, and pertinent available records from the EMR.  Social Determinants Affecting Complexity of Care: Patient has no clinically significant social determinants affecting this chief complaint..   ED Course:    Medical Decision Making Patient here  with chest pain/epigastric pain.  States he can't tell if this is gas or his heart.  Has hx of CABG.  Followed by Dr. Bettina Gavia.    He states that he is not having any pain now.  He has had cough, but denies fever or productive cough.    Trops are flat at 5.  EKG shows no acute ischemic changes.  I doubt ACS.  He's pain free.  Feel that outpatient cardiology follow-up is appropriate.   Symptoms also seem to have improved after the IV pepcid.  Could be GI etiology.    Amount and/or Complexity of Data Reviewed Labs: ordered.    Details: Trop 5->5, less likely to be ACS Na  132, giving some fluid Radiology:     Details: 1. No acute aortic syndrome or other specific cause for symptoms.  2. Polycystic kidneys and liver.  3. T10 Paget's disease.  Remote compression fractures.    Risk Prescription drug management.     Consultants: No consultations were needed in caring for this patient.   Treatment and Plan: I considered admission due to patient's initial presentation, but after considering the examination and diagnostic results, patient will not require admission and can be discharged with outpatient follow-up.    Final Clinical Impressions(s) / ED Diagnoses     ICD-10-CM   1. Chest pain, unspecified type  R07.9 Ambulatory referral to Cardiology      ED Discharge Orders          Ordered    Ambulatory referral to Cardiology       Comments: If you have not heard from the Cardiology office within the next 72 hours please call [phone].   03/28/22 0452              Discharge Instructions Discussed with and Provided to Patient:     Discharge Instructions      Please follow-up with your cardiologist.  Their office should call you for an appointment.  Your heart tests were normal today.  If something changes or worsens, please return to the ER.       Montine Circle, PA-C 03/28/22 0531    Merrily Pew, MD 03/28/22 [phone]

## 2022-03-28 NOTE — Discharge Instructions (Addendum)
Please follow-up with your cardiologist.  Their office should call you for an appointment.  Your heart tests were normal today.  If something changes or worsens, please return to the ER.

## 2022-03-29 ENCOUNTER — Encounter: Payer: Self-pay | Admitting: Cardiology

## 2022-04-01 ENCOUNTER — Encounter: Payer: Self-pay | Admitting: Cardiology

## 2022-04-01 MED ORDER — CARVEDILOL 6.25 MG PO TABS: 6.2500 mg | ORAL_TABLET | Freq: Two times a day (BID) | ORAL | 3 refills | Status: DC

## 2022-04-26 ENCOUNTER — Other Ambulatory Visit: Payer: Self-pay

## 2022-04-26 MED ORDER — VALSARTAN 80 MG PO TABS: 80.0000 mg | ORAL_TABLET | Freq: Every day | ORAL | 3 refills | Status: DC

## 2022-04-26 MED ORDER — METOPROLOL SUCCINATE ER 50 MG PO TB24: 50.0000 mg | ORAL_TABLET | Freq: Every day | ORAL | 3 refills | Status: DC

## 2022-04-30 ENCOUNTER — Telehealth: Payer: Self-pay

## 2022-04-30 NOTE — Telephone Encounter (Signed)
[place] office called patient to reschedule his appointment and he mentioned that he did not understand why he was taking the new medication he had recently been prescribed. Called patient and clarified that the reason he was taking Valsartan and Metoprolol was because of his high blood pressure. He had been on the carvedilol but he started having CNS side effects and Dr. Bettina Gavia had ordered that we stop his carvedilol and restart his Metoprolol and Valsartan. Patient was agreeable with this plan and had no further questions at this time.

## 2022-05-03 ENCOUNTER — Ambulatory Visit: Payer: Commercial Managed Care - PPO | Admitting: Cardiology

## 2022-06-06 NOTE — Progress Notes (Unsigned)
Cardiology Office Note:    Date:  06/07/2022   ID:  Tyler Sweeney, DOB 04/06/1960, MRN [id number]   PCP:  Orpah Melter, MD  Cardiologist:  Shirlee More, MD    Referring MD: Orpah Melter, MD    ASSESSMENT:    1. Coronary artery disease involving native coronary artery of native heart without angina pectoris   2. Dyslipidemia, goal LDL below 70   3. Elevated Lp(a)   4. Primary hypertension    PLAN:    In order of problems listed above: 6 months Dr. Agustin Cree in [facility]. - He has stable CAD no anginal discomfort on current medical therapy we will continue aspirin beta-blocker or nitrate and combination rosuvastatin and Repatha with elevated LP(a) recheck lipid profile CMP today continue current antihypertensive   Next appointment: Follow-up 6 months   Medication Adjustments/Labs and Tests Ordered: Current medicines are reviewed at length with the patient today.  Concerns regarding medicines are outlined above.  Orders Placed This Encounter  Procedures   Comp Met (CMET)   Lipid Profile   No orders of the defined types were placed in this encounter.   Chief Complaint  Patient presents with   Follow-up    History of Present Illness:    Tyler Sweeney is a 62 y.o. 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  He was seen at [facility] with chest pain 04/04/2022 his EKG did not show acute ischemic changes high-sensitivity troponin did not indicate ACS and he was discharged from the hospital.  High-sensitivity troponin was 5 normal.  Compliance with diet, lifestyle and medications: Yes  There is a family issue with intense stress associated that his blood pressure is elevated and he has done much better with valsartan being added to his  regimen and clearly has developed hypertension He is back to normal exercising on a regular basis does not have angina edema shortness of breath palpitation or syncope He tolerates his statin without muscle pain or weakness Past Medical History:  Diagnosis Date   Anginal pain    Asthma    as a child   CAD (coronary artery disease) 11/24/2012   11/24/12 Cath nl LM, 99%LAD post D1, 100% 1rst OM with collaterals, 40% circumflex, no RCA dz, s/p DES LAD & CFX/OM1 Dr. Tamala Julian     CAD S/P PCI 2014-2015 11/24/2012   07/05/13 Cutting balloon of dirc distal to stent with good result.  Patent LAD and circ culotte stents, 80%prox circ distal to prior stent new,  Normal RCA  11/24/12 Cath normal LM, 99%LAD post diagonal, 100% 1rst OM with collaterals, 40% circumflex, no RCA Stent LAD 3.0 x 24 mm Promus  bifurcation stenting of circumflex 3.0 x 25 mm Promus post dilated to 3.25 and 2.75 x 20 Promus into circumflex po   Chest pain 07/05/2013   Dyslipidemia, goal LDL below 70 11/24/2012   GERD (gastroesophageal reflux disease) 05/08/2010   Hyperlipidemia 11/24/2012   Injury of left foot 08/14/2019   MI, old 2014   Diagnosis based on left ventricular dysfunction seen on stress test   Polycystic kidney disease    "dx'd but have never had any symptoms" (07/05/2013)   Renal insufficiency 02/13/2018   S/P appendectomy 09/19/2016   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  LIMA TO LAD,  RADIAL ARTERY TO OM1 AND OM2 SEQUENTIAL, SVG TO DIAGONAL.;  Surgeon: Melrose Nakayama, MD;  Location: [place];  Service: Open Heart Surgery;  Laterality: N/A;   ENDOVEIN HARVEST OF GREATER SAPHENOUS VEIN Right 07/25/2019   Procedure: [place] Of Greater Saphenous Vein;  Surgeon: Melrose Nakayama, MD;  Location: [place];  Service: Open Heart Surgery;  Laterality: Right;   FOOT SURGERY Left 1984   "cyst"   INGUINAL HERNIA REPAIR Right 1962   LAPAROSCOPIC APPENDECTOMY N/A 09/19/2016   Procedure: APPENDECTOMY LAPAROSCOPIC;  Surgeon: Ralene Ok, MD;  Location: [place];  Service: General;  Laterality: N/A;   LEFT HEART CATH AND CORONARY ANGIOGRAPHY N/A 07/24/2019   Procedure: LEFT HEART CATH AND CORONARY ANGIOGRAPHY;  Surgeon: Burnell Blanks, MD;  Location: [place] [facility];  Service: Cardiovascular;  Laterality: N/A;   LEFT HEART CATH AND CORS/GRAFTS ANGIOGRAPHY N/A 04/16/2020   Procedure: LEFT HEART CATH AND CORS/GRAFTS ANGIOGRAPHY;  Surgeon: Wellington Hampshire, MD;  Location: [place] [facility];  Service: Cardiovascular;  Laterality: N/A;   LEFT HEART CATHETERIZATION WITH CORONARY ANGIOGRAM N/A 11/24/2012   Procedure: LEFT HEART CATHETERIZATION WITH CORONARY ANGIOGRAM;  Surgeon: Jacolyn Reedy, MD;  Location: [facility] CATH LAB;  Service: Cardiovascular;  Laterality: N/A;   LEFT HEART CATHETERIZATION WITH CORONARY ANGIOGRAM N/A 07/05/2013   Procedure: LEFT HEART CATHETERIZATION WITH CORONARY ANGIOGRAM;  Surgeon: Jacolyn Reedy, MD;  Location: [facility] CATH LAB;  Service: Cardiovascular;  Laterality: N/A;   PERCUTANEOUS CORONARY STENT INTERVENTION (PCI-S)  11/24/2012   Procedure: PERCUTANEOUS CORONARY STENT INTERVENTION (PCI-S);  Surgeon: Jacolyn Reedy, MD;  Location: [facility] CATH LAB;  Service: Cardiovascular;;   PERCUTANEOUS CORONARY STENT INTERVENTION (PCI-S) Right 07/05/2013   Procedure: PERCUTANEOUS CORONARY STENT INTERVENTION (PCI-S);  Surgeon: Jacolyn Reedy, MD;  Location: [facility] CATH  LAB;  Service: Cardiovascular;  Laterality: Right;   TEE WITHOUT CARDIOVERSION N/A 07/25/2019   Procedure: TRANSESOPHAGEAL ECHOCARDIOGRAM (TEE);  Surgeon: Melrose Nakayama, MD;  Location: [place];  Service: Open Heart Surgery;  Laterality: N/A;    Current Medications: Current Meds  Medication Sig   aspirin 81 MG chewable tablet Chew 1 tablet (81 mg total) by mouth daily.   Cholecalciferol (VITAMIN D3 PO) Take 1 tablet by mouth every 7 (seven) days.   Evolocumab (REPATHA SURECLICK) [id number] MG/ML SOAJ Inject 1 Pen into the skin every 14 (fourteen) days.   famotidine (PEPCID) 10 MG tablet Take 10 mg by mouth 2 (two) times daily.   isosorbide mononitrate (IMDUR) 30 MG 24 hr tablet TAKE 1 TABLET BY MOUTH EVERY DAY   metoprolol succinate (TOPROL-XL) 50 MG 24 hr tablet Take 1 tablet (50 mg total) by mouth daily. Take with or immediately following a meal.   Multiple Vitamins-Minerals (ZINC PO) Take 1 tablet by mouth once a week. Takes every 3-4 days   nitroGLYCERIN (NITROSTAT) 0.4 MG SL tablet PLACE 1 TABLET UNDER THE TONGUE EVERY 5 MINUTES AS NEEDED  FOR CHEST PAIN   rosuvastatin (CRESTOR) 10 MG tablet TAKE 1 TABLET BY MOUTH EVERY DAY   valsartan (DIOVAN) 80 MG tablet Take 1 tablet (80 mg total) by mouth daily.     Allergies:   Shellfish allergy, Gluten meal, Milk-related compounds, and Ramipril   Social History   Socioeconomic History   Marital status: Married    Spouse name: Not on file   Number of children: Not on file   Years of education: Not on file   Highest education level: Not on file  Occupational History   Occupation: PARKING MANAGER    Employer: [employer]  Tobacco Use   Smoking status: Never   Smokeless tobacco: Never  Vaping Use   Vaping Use: Never used  Substance and Sexual Activity   Alcohol use: No   Drug use: No   Sexual activity: Yes  Other Topics Concern   Not on file  Social History Narrative   Not on file   Social Determinants of Health   Financial Resource  Strain: Not on file  Food Insecurity: Not on file  Transportation Needs: Not on file  Physical Activity: Not on file  Stress: Not on file  Social Connections: Not on file     Family History: The patient's family history includes Coronary artery disease in an other family member; Heart attack (age of onset: 54) in his mother; Heart attack (age of onset: 59) in his father; Stroke (age of onset: 17) in his mother. ROS:   Please see the history of present illness.    All other systems reviewed and are negative.  EKGs/Labs/Other Studies Reviewed:    The following studies were reviewed today:  Cardiac Studies & Procedures   CARDIAC CATHETERIZATION  CARDIAC CATHETERIZATION 04/16/2020  Narrative  Ost LAD to Prox LAD lesion is 60% stenosed.  Mid LAD lesion is 20% stenosed.  2nd Diag lesion is 70% stenosed.  1st Mrg lesion is 60% stenosed.  Ost Cx to Prox Cx lesion is 50% stenosed.  Mid Cx lesion is 60% stenosed.  Prox RCA to Mid RCA lesion is 30% stenosed.  The left ventricular systolic function is normal.  LV end diastolic pressure is normal.  The left ventricular ejection fraction is 55-65% by visual estimate.  SVG and is normal in caliber.  The graft exhibits no disease.  Left radial artery.  Dist Graft lesion is 100% stenosed.  Mid Graft lesion is 100% stenosed.  1.  Significant underlying three-vessel coronary artery disease with atretic LIMA to LAD and radial artery graft to OM 2.  Patent SVG to diagonal which supplies retrograde flow to the LAD.  In addition, the ostial LAD stenosis that is caused by the ostial left circumflex stent does not seem to be obstructive given that there is very good antegrade flow.  The left circumflex itself has moderate nonobstructive disease due to in-stent restenosis as well as moderate stenosis distal to the stent.  2.  Normal LV systolic function and normal left ventricular end-diastolic pressure.  Recommendations: Suspect  failure of LIMA to LAD and radial artery graft to OM is due to competitive flow from the native vessels as well as competitive flow to the LAD from the SVG to diagonal. I do not see a need for revascularization at this point given that the disease in the left circumflex appears to be moderate and the LAD distribution is getting adequate antegrade flow as well as retrograde flow from the SVG to diagonal.  Recommend aggressive medical  therapy.  Findings Coronary Findings Diagnostic  Dominance: Right  Left Anterior Descending Ost LAD to Prox LAD lesion is 60% stenosed. Mid LAD lesion is 20% stenosed. The lesion was previously treated using a drug eluting stent over 2 years ago.  Second Diagonal Branch Vessel is moderate in size. 2nd Diag lesion is 70% stenosed.  Left Circumflex Vessel is large. Ost Cx to Prox Cx lesion is 50% stenosed. The lesion was previously treated using a drug eluting stent over 2 years ago. Mid Cx lesion is 60% stenosed.  First Obtuse Marginal Branch Vessel is large in size. 1st Mrg lesion is 60% stenosed. The lesion was previously treated using a drug eluting stent over 2 years ago.  Third Obtuse Marginal Branch Vessel is moderate in size.  Right Coronary Artery Vessel is large. Prox RCA to Mid RCA lesion is 30% stenosed.  Saphenous Graft To 2nd Diag SVG and is normal in caliber. The graft exhibits no disease.  Left Radial Artery Graft To 3rd Mrg Left radial artery. Dist Graft lesion is 100% stenosed.  LIMA Graft To Dist LAD Mid Graft lesion is 100% stenosed.  Intervention  No interventions have been documented.   CARDIAC CATHETERIZATION  CARDIAC CATHETERIZATION 07/24/2019  Narrative  Prox RCA to Mid RCA lesion is 40% stenosed.  Ost Cx to Prox Cx lesion is 60% stenosed.  1st Mrg lesion is 70% stenosed.  Mid Cx lesion is 80% stenosed.  Ost LAD to Prox LAD lesion is 95% stenosed.  Mid LAD lesion is 20% stenosed.  2nd Diag lesion is  70% stenosed.  There is mild left ventricular systolic dysfunction.  LV end diastolic pressure is normal.  The left ventricular ejection fraction is 45-50% by visual estimate.  There is no mitral valve regurgitation.  1. Severe ostial LAD stenosis. This arises just beyond the ostial Circumflex stent that seems to extend into the left main. Patent mid LAD stent with minimal restenosis. The moderate caliber Diagonal branch that arises from the mid LAD stented segment has moderately severe ostial stenosis. 2. The ostial/proximal Circumflex stented segment is patent with moderate stent restenosis. The stented segment of the ostial OM1 branch is patent with moderately severe stenosis. The mid Circumflex has a severe stenosis just before the takeoff of OM2. 3. The RCA is a large, dominant vessel with mild mid stenosis. 4. Mild segmental LV systolic dysfunction.  Recommendations: He has progression of his CAD. The ostial LAD disease is complicated by the ostial Circumflex stent that extends back into the left main. The stented segment of the ostial/proximal Circumflex and OM has moderate restenosis. The mid Circumflex has severe de novo disease. The Diagonal is affected by the prior mid LAD stent. His anatomy and disease is not favorable for treatment with PCI as any attempt at stenting of the LAD would potentially compromise flow into the Circumflex system. I have reviewed his cath films with Dr. Tamala Julian. Will consult CT surgery for CABG. Resume IV Heparin 6 hours post sheath pull and continue IV NTG. Continue statin, beta blocker and ASA. Will admit to ICU given mild chest pain.  Findings Coronary Findings Diagnostic  Dominance: Right  Left Anterior Descending Ost LAD to Prox LAD lesion is 95% stenosed. Mid LAD lesion is 20% stenosed. The lesion was previously treated using a drug eluting stent over 2 years ago.  Second Diagonal Branch Vessel is moderate in size. 2nd Diag lesion is 70%  stenosed.  Left Circumflex Vessel is large. Ost Cx to Prox Cx  lesion is 60% stenosed. The lesion was previously treated using a drug eluting stent over 2 years ago. Mid Cx lesion is 80% stenosed.  First Obtuse Marginal Branch Vessel is large in size. 1st Mrg lesion is 70% stenosed. The lesion was previously treated using a drug eluting stent over 2 years ago.  Third Obtuse Marginal Branch Vessel is moderate in size.  Right Coronary Artery Vessel is large. Prox RCA to Mid RCA lesion is 40% stenosed.  Intervention  No interventions have been documented.     ECHOCARDIOGRAM  ECHOCARDIOGRAM COMPLETE 07/24/2019  Narrative ECHOCARDIOGRAM REPORT    Patient Name:   DEONTAYE FARVER Date of Exam: 07/24/2019 Medical Rec #:  [id number]     Height:       71.0 in Accession #:    [id number]    Weight:       183.0 lb Date of Birth:  1960-09-08     BSA:          2.030 m Patient Age:    9 years      BP:           161/90 mmHg Patient Gender: M             HR:           79 bpm. Exam Location:  Inpatient  Procedure: 2D Echo  Indications:    coronary artery disease  History:        Patient has no prior history of Echocardiogram examinations. Chronic kidney disease; Risk Factors:Dyslipidemia.  Sonographer:    Johny Chess Referring Phys: [place]   1. Left ventricular ejection fraction, by estimation, is 55 to 60%. The left ventricle has normal function. The left ventricle has no regional wall motion abnormalities. There is mild left ventricular hypertrophy. Left ventricular diastolic parameters are consistent with Grade I diastolic dysfunction (impaired relaxation). 2. Right ventricular systolic function is normal. The right ventricular size is normal. 3. Moderate pleural effusion in the left lateral region. 4. The mitral valve is abnormal. Trivial mitral valve regurgitation. 5. The aortic valve is tricuspid. Aortic valve regurgitation is not  visualized. 6. The inferior vena cava is normal in size with greater than 50% respiratory variability, suggesting right atrial pressure of 3 mmHg.  FINDINGS Left Ventricle: Left ventricular ejection fraction, by estimation, is 55 to 60%. The left ventricle has normal function. The left ventricle has no regional wall motion abnormalities. The left ventricular internal cavity size was normal in size. There is mild left ventricular hypertrophy. Left ventricular diastolic parameters are consistent with Grade I diastolic dysfunction (impaired relaxation). Indeterminate filling pressures.  Right Ventricle: The right ventricular size is normal. No increase in right ventricular wall thickness. Right ventricular systolic function is normal.  Left Atrium: Left atrial size was normal in size.  Right Atrium: Right atrial size was normal in size.  Pericardium: There is no evidence of pericardial effusion.  Mitral Valve: The mitral valve is abnormal. There is mild thickening of the mitral valve leaflet(s). There is mild calcification of the mitral valve leaflet(s). Trivial mitral valve regurgitation.  Tricuspid Valve: The tricuspid valve is grossly normal. Tricuspid valve regurgitation is trivial.  Aortic Valve: The aortic valve is tricuspid. Aortic valve regurgitation is not visualized.  Pulmonic Valve: The pulmonic valve was normal in structure. Pulmonic valve regurgitation is not visualized.  Aorta: The aortic root and ascending aorta are structurally normal, with no evidence of dilitation.  Venous: The inferior vena cava is  normal in size with greater than 50% respiratory variability, suggesting right atrial pressure of 3 mmHg.  IAS/Shunts: No atrial level shunt detected by color flow Doppler.  Additional Comments: There is a moderate pleural effusion in the left lateral region.   LEFT VENTRICLE PLAX 2D LVIDd:         3.90 cm  Diastology LVIDs:         2.70 cm  LV e' lateral:   12.30  cm/s LV PW:         1.30 cm  LV E/e' lateral: 5.1 LV IVS:        1.10 cm  LV e' medial:    6.64 cm/s LVOT diam:     2.40 cm  LV E/e' medial:  9.5 LV SV:         90 LV SV Index:   45 LVOT Area:     4.52 cm   RIGHT VENTRICLE             IVC RV S prime:     17.40 cm/s  IVC diam: 1.80 cm TAPSE (M-mode): 2.4 cm  LEFT ATRIUM             Index       RIGHT ATRIUM           Index LA diam:        3.50 cm 1.72 cm/m  RA Area:     15.30 cm LA Vol (A2C):   58.4 ml 28.76 ml/m RA Volume:   36.90 ml  18.17 ml/m LA Vol (A4C):   40.9 ml 20.14 ml/m LA Biplane Vol: 48.9 ml 24.08 ml/m AORTIC VALVE LVOT Vmax:   94.20 cm/s LVOT Vmean:  59.800 cm/s LVOT VTI:    0.200 m  AORTA Ao Root diam: 3.50 cm  MV E velocity: 63.00 cm/s MV A velocity: 93.60 cm/s  SHUNTS MV E/A ratio:  0.67        Systemic VTI:  0.20 m Systemic Diam: 2.40 cm  Lyman Bishop MD Electronically signed by Lyman Bishop MD Signature Date/Time: 07/24/2019/5:39:17 PM    Final   TEE  ECHO INTRAOPERATIVE TEE 07/25/2019  Narrative *INTRAOPERATIVE TRANSESOPHAGEAL REPORT *    Patient Name:   Tyler Sweeney  Date of Exam: 07/25/2019 Medical Rec #:  [id number]      Height:       71.0 in Accession #:    [id number]     Weight:       183.0 lb Date of Birth:  Feb 19, 1961      BSA:          2.03 m Patient Age:    51 years       BP:           122/79 mmHg Patient Gender: M              HR:           71 bpm. Exam Location:  Anesthesiology  Transesophogeal exam was perform intraoperatively during surgical procedure. Patient was closely monitored under general anesthesia during the entirety of examination.  Indications:     CAD Sonographer:     Dustin Flock RDCS Performing Phys: [place] Diagnosing Phys: Hoy Morn MD  Complications: No known complications during this procedure. POST-OP IMPRESSIONS Overall, there were no significant changes from pre-bypass. - Left Ventricle: The left ventricle is  unchanged from pre-bypass. has normal systolic function, with an ejection fraction of 60%. - Aorta: The aorta appears unchanged  from pre-bypass. - Left Atrial Appendage: The left atrial appendage appears unchanged from pre-bypass. - Aortic Valve: There is mild regurgitation. - Mitral Valve: The mitral valve appears unchanged from pre-bypass. There is mild regurgitation. - Tricuspid Valve: The tricuspid valve appears unchanged from pre-bypass. There is mild regurgitation. - Interatrial Septum: The interatrial septum appears unchanged from pre-bypass.  PRE-OP FINDINGS Left Ventricle: The left ventricle has normal systolic function, with an ejection fraction of 60-65%. The cavity size was normal. There is mild concentric left ventricular hypertrophy. No evidence of left ventricular regional wall motion abnormalities.  Right Ventricle: The right ventricle has normal systolic function. The cavity was normal. There is no increase in right ventricular wall thickness. Right ventricular systolic pressure is normal.  Left Atrium: Left atrial size was normal in size. The left atrial appendage is well visualized and there is no evidence of thrombus present. Left atrial appendage velocity is normal at greater than 40 cm/s.  Right Atrium: Right atrial size was normal in size. Right atrial pressure is estimated at 10 mmHg.  Interatrial Septum: No atrial level shunt detected by color flow Doppler.  Pericardium: There is no evidence of pericardial effusion. There is pleural effusion in the left lateral region.  Mitral Valve: The mitral valve is normal in structure. Mild thickening of the mitral valve leaflet. Mild calcification of the mitral valve leaflet. Mitral valve regurgitation is trivial by color flow Doppler. There is No evidence of mitral stenosis.  Tricuspid Valve: The tricuspid valve was normal in structure. Tricuspid valve regurgitation is trivial by color flow Doppler.  Aortic Valve: The  aortic valve is normal in structure. Aortic valve regurgitation is trivial by color flow Doppler. There is no evidence of aortic valve stenosis. There is no evidence of a vegetation on the aortic valve.  Pulmonic Valve: The pulmonic valve was normal in structure. Pulmonic valve regurgitation is trivial by color flow Doppler.   Aorta: The aortic root, ascending aorta and aortic arch are normal in size and structure.  Pulmonary Artery: The pulmonary artery is of normal size.   Hoy Morn MD Electronically signed by Hoy Morn MD Signature Date/Time: 07/25/2019/3:06:50 PM    Final              Recent Labs: 08/04/2021: ALT 17 03/27/2022: BUN 12; Creatinine, Ser 1.29; Hemoglobin 13.6; Platelets 226; Potassium 3.7; Sodium 132  Recent Lipid Panel    Component Value Date/Time   CHOL 84 (L) 08/04/2021 0830   TRIG 99 08/04/2021 0830   HDL 46 08/04/2021 0830   CHOLHDL 1.8 08/04/2021 0830   CHOLHDL 2.3 07/24/2019 1946   VLDL 15 07/24/2019 1946   [place] 19 08/04/2021 0830    Physical Exam:    VS:  BP 124/82 (BP Location: Right Arm, Patient Position: Sitting)   Pulse 66   Ht 5\' 11"  (1.803 m)   Wt 187 lb (84.8 kg)   SpO2 99%   BMI 26.08 kg/m     Wt Readings from Last 3 Encounters:  06/07/22 187 lb (84.8 kg)  03/27/22 190 lb (86.2 kg)  08/04/21 190 lb 1.3 oz (86.2 kg)     GEN:  Well nourished, well developed in no acute distress HEENT: Normal NECK: No JVD; No carotid bruits LYMPHATICS: No lymphadenopathy CARDIAC: RRR, no murmurs, rubs, gallops RESPIRATORY:  Clear to auscultation without rales, wheezing or rhonchi  ABDOMEN: Soft, non-tender, non-distended MUSCULOSKELETAL:  No edema; No deformity  SKIN: Warm and dry NEUROLOGIC:  Alert and oriented x 3 PSYCHIATRIC:  Normal affect    Signed, Shirlee More, MD  06/07/2022 8:51 AM    [facility]

## 2022-06-07 ENCOUNTER — Encounter: Payer: Self-pay | Admitting: Cardiology

## 2022-06-07 ENCOUNTER — Ambulatory Visit: Payer: Commercial Managed Care - PPO | Attending: Cardiology | Admitting: Cardiology

## 2022-06-07 VITALS — BP 124/82 | HR 66 | Ht 71.0 in | Wt 187.0 lb

## 2022-06-07 DIAGNOSIS — I10 Essential (primary) hypertension: Secondary | ICD-10-CM | POA: Diagnosis not present

## 2022-06-07 DIAGNOSIS — I2510 Atherosclerotic heart disease of native coronary artery without angina pectoris: Secondary | ICD-10-CM

## 2022-06-07 DIAGNOSIS — E7841 Elevated Lipoprotein(a): Secondary | ICD-10-CM

## 2022-06-07 DIAGNOSIS — E785 Hyperlipidemia, unspecified: Secondary | ICD-10-CM | POA: Diagnosis not present

## 2022-06-07 NOTE — Patient Instructions (Signed)
Medication Instructions:  Your physician recommends that you continue on your current medications as directed. Please refer to the Current Medication list given to you today.  *If you need a refill on your cardiac medications before your next appointment, please call your pharmacy*   Lab Work: Your physician recommends that you return for lab work in:   Labs today: CMP, Lipids  If you have labs (blood work) drawn today and your tests are completely normal, you will receive your results only by: [place] (if you have [place]) OR A paper copy in the mail If you have any lab test that is abnormal or we need to change your treatment, we will call you to review the results.   Testing/Procedures: None   Follow-Up: At [facility], you and your health needs are our priority.  As part of our continuing mission to provide you with exceptional heart care, we have created designated Provider Care Teams.  These Care Teams include your primary Cardiologist (physician) and Advanced Practice Providers (APPs -  Physician Assistants and Nurse Practitioners) who all work together to provide you with the care you need, when you need it.  We recommend signing up for the patient portal called "MyChart".  Sign up information is provided on this After Visit Summary.  MyChart is used to connect with patients for Virtual Visits (Telemedicine).  Patients are able to view lab/test results, encounter notes, upcoming appointments, etc.  Non-urgent messages can be sent to your provider as well.   To learn more about what you can do with MyChart, go to [email or web address].    Your next appointment:   6 month(s)  Provider:   Jenne Campus, MD    Other Instructions None

## 2022-06-08 LAB — COMPREHENSIVE METABOLIC PANEL
ALT: 19 IU/L (ref 0–44)
AST: 24 IU/L (ref 0–40)
Albumin/Globulin Ratio: 1.7 (ref 1.2–2.2)
Albumin: 4.3 g/dL (ref 3.9–4.9)
Alkaline Phosphatase: 98 IU/L (ref 44–121)
BUN/Creatinine Ratio: 17 (ref 10–24)
BUN: 21 mg/dL (ref 8–27)
Bilirubin Total: 0.3 mg/dL (ref 0.0–1.2)
CO2: 22 mmol/L (ref 20–29)
Calcium: 9.3 mg/dL (ref 8.6–10.2)
Chloride: 103 mmol/L (ref 96–106)
Creatinine, Ser: 1.23 mg/dL (ref 0.76–1.27)
Globulin, Total: 2.6 g/dL (ref 1.5–4.5)
Glucose: 87 mg/dL (ref 70–99)
Potassium: 4.4 mmol/L (ref 3.5–5.2)
Sodium: 139 mmol/L (ref 134–144)
Total Protein: 6.9 g/dL (ref 6.0–8.5)
eGFR: 66 mL/min/{1.73_m2} (ref >59)

## 2022-06-08 LAB — LIPID PANEL
Chol/HDL Ratio: 1.5 ratio (ref 0.0–5.0)
Cholesterol, Total: 71 mg/dL — ABNORMAL LOW (ref 100–199)
HDL: 47 mg/dL (ref >39)
LDL Chol Calc (NIH): 11 mg/dL (ref 0–99)
Triglycerides: 52 mg/dL (ref 0–149)
VLDL Cholesterol Cal: 13 mg/dL (ref 5–40)

## 2022-09-01 ENCOUNTER — Other Ambulatory Visit: Payer: Self-pay | Admitting: Cardiology

## 2022-11-15 ENCOUNTER — Other Ambulatory Visit: Payer: Self-pay | Admitting: Cardiology

## 2023-02-12 ENCOUNTER — Other Ambulatory Visit: Payer: Self-pay | Admitting: Cardiology

## 2023-02-14 NOTE — Telephone Encounter (Signed)
RX sent

## 2023-03-23 ENCOUNTER — Ambulatory Visit: Payer: Commercial Managed Care - PPO | Attending: Cardiology | Admitting: Cardiology

## 2023-03-23 ENCOUNTER — Encounter: Payer: Self-pay | Admitting: Cardiology

## 2023-03-23 VITALS — BP 116/76 | HR 67 | Ht 71.0 in | Wt 189.0 lb

## 2023-03-23 DIAGNOSIS — Z951 Presence of aortocoronary bypass graft: Secondary | ICD-10-CM

## 2023-03-23 DIAGNOSIS — E782 Mixed hyperlipidemia: Secondary | ICD-10-CM

## 2023-03-23 DIAGNOSIS — I10 Essential (primary) hypertension: Secondary | ICD-10-CM

## 2023-03-23 DIAGNOSIS — I2510 Atherosclerotic heart disease of native coronary artery without angina pectoris: Secondary | ICD-10-CM | POA: Diagnosis not present

## 2023-03-23 NOTE — Patient Instructions (Addendum)
 Medication Instructions:  Your physician recommends that you continue on your current medications as directed. Please refer to the Current Medication list given to you today.  *If you need a refill on your cardiac medications before your next appointment, please call your pharmacy*   Lab Work: None Ordered If you have labs (blood work) drawn today and your tests are completely normal, you will receive your results only by: MyChart Message (if you have MyChart) OR A paper copy in the mail If you have any lab test that is abnormal or we need to change your treatment, we will call you to review the results.   Testing/Procedures: Your physician has requested that you have a lexiscan myoview. For further information please visit [email or web address] Please follow instruction sheet, as given.  The test will take approximately 3 to 4 hours to complete; you may bring reading material.  If someone comes with you to your appointment, they will need to remain in the main lobby due to limited space in the testing area.    How to prepare for your Myocardial Perfusion Test: Exercise Cardiolite Do not eat or drink 3 hours prior to your test, except you may have water. Do not consume products containing caffeine (regular or decaffeinated) 12 hours prior to your test. (ex: coffee, chocolate, sodas, tea). Do bring a list of your current medications with you.  If not listed below, you may take your medications as normal. Do wear comfortable clothes (no dresses or overalls) and walking shoes, tennis shoes preferred (No heels or open toe shoes are allowed). Do NOT wear cologne, perfume, aftershave, or lotions (deodorant is allowed). If these instructions are not followed, your test will have to be rescheduled.     Follow-Up: At [facility], you and your health needs are our priority.  As part of our continuing mission to provide you with exceptional heart care, we have created designated Provider Care  Teams.  These Care Teams include your primary Cardiologist (physician) and Advanced Practice Providers (APPs -  Physician Assistants and Nurse Practitioners) who all work together to provide you with the care you need, when you need it.  We recommend signing up for the patient portal called "MyChart".  Sign up information is provided on this After Visit Summary.  MyChart is used to connect with patients for Virtual Visits (Telemedicine).  Patients are able to view lab/test results, encounter notes, upcoming appointments, etc.  Non-urgent messages can be sent to your provider as well.   To learn more about what you can do with MyChart, go to [email or web address].    Your next appointment:   6 month(s)  The format for your next appointment:   In Person  Provider:   Gypsy Balsam, MD    Other Instructions NA

## 2023-03-23 NOTE — Progress Notes (Signed)
Cardiology Office Note:    Date:  03/23/2023   ID:  Tyler Sweeney, DOB 09-Mar-1960, MRN [id number]  PCP:  Joycelyn Rua, MD  Cardiologist:  Gypsy Balsam, MD    Referring MD: Joycelyn Rua, MD   Chief Complaint  Patient presents with   Follow-up    History of Present Illness:    Tyler Sweeney is a 63 y.o. male past medical history significant for coronary artery disease he did have angioplasty done before and eventually in 2021 required coronary bypass graft for grafts cardiac catheterization done next year 2022 show however occluded LIMA to LAD and occluded radial graft to obtuse marginal branch rest of the graft were patent.  Since that time he was put on aggressive cholesterol-lowering medications Repatha and he comes today for regular follow-up.  Doing well denies have any chest pain tightness squeezing pressure burning chest that is typical does have some pinches and twitches in the left side of his chest difficult to judge if this is coronary artery disease and not admits he admits that this is difficult for him as well.  Past Medical History:  Diagnosis Date   Anginal pain (HCC)    Asthma    as a child   CAD (coronary artery disease) 11/24/2012   11/24/12 Cath nl LM, 99%LAD post D1, 100% 1rst OM with collaterals, 40% circumflex, no RCA dz, s/p DES LAD & CFX/OM1 Dr. Katrinka Blazing     CAD S/P PCI 2014-2015 11/24/2012   07/05/13 Cutting balloon of dirc distal to stent with good result.  Patent LAD and circ culotte stents, 80%prox circ distal to prior stent new,  Normal RCA  11/24/12 Cath normal LM, 99%LAD post diagonal, 100% 1rst OM with collaterals, 40% circumflex, no RCA Stent LAD 3.0 x 24 mm Promus  bifurcation stenting of circumflex 3.0 x 25 mm Promus post dilated to 3.25 and 2.75 x 20 Promus into circumflex po   Chest pain 07/05/2013   Dyslipidemia, goal LDL below 70 11/24/2012   GERD (gastroesophageal reflux disease) 05/08/2010   Hyperlipidemia 11/24/2012   Injury of left foot  08/14/2019   MI, old 2014   Diagnosis based on left ventricular dysfunction seen on stress test   Polycystic kidney disease    "dx'd but have never had any symptoms" (07/05/2013)   Renal insufficiency 02/13/2018   S/P appendectomy 09/19/2016   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  LIMA TO LAD, RADIAL ARTERY TO OM1 AND OM2 SEQUENTIAL, SVG TO DIAGONAL.;  Surgeon: Loreli Slot, MD;  Location: [facility] OR;  Service: Open Heart Surgery;  Laterality: N/A;   ENDOVEIN HARVEST OF GREATER SAPHENOUS VEIN Right 07/25/2019   Procedure: Mack Guise Of Greater Saphenous Vein;  Surgeon: Loreli Slot, MD;  Location: [facility] OR;  Service: Open Heart Surgery;  Laterality: Right;   FOOT SURGERY Left 1984   "cyst"   INGUINAL HERNIA REPAIR Right 1962   LAPAROSCOPIC APPENDECTOMY N/A 09/19/2016  Procedure: APPENDECTOMY LAPAROSCOPIC;  Surgeon: Axel Filler, MD;  Location: [facility] OR;  Service: General;  Laterality: N/A;   LEFT HEART CATH AND CORONARY ANGIOGRAPHY N/A 07/24/2019   Procedure: LEFT HEART CATH AND CORONARY ANGIOGRAPHY;  Surgeon: Kathleene Hazel, MD;  Location: [facility] INVASIVE CV LAB;  Service: Cardiovascular;  Laterality: N/A;   LEFT HEART CATH AND CORS/GRAFTS ANGIOGRAPHY N/A 04/16/2020   Procedure: LEFT HEART CATH AND CORS/GRAFTS ANGIOGRAPHY;  Surgeon: Iran Ouch, MD;  Location: [facility] INVASIVE CV LAB;  Service: Cardiovascular;  Laterality: N/A;    LEFT HEART CATHETERIZATION WITH CORONARY ANGIOGRAM N/A 11/24/2012   Procedure: LEFT HEART CATHETERIZATION WITH CORONARY ANGIOGRAM;  Surgeon: Othella Boyer, MD;  Location: [facility] CATH LAB;  Service: Cardiovascular;  Laterality: N/A;   LEFT HEART CATHETERIZATION WITH CORONARY ANGIOGRAM N/A 07/05/2013   Procedure: LEFT HEART CATHETERIZATION WITH CORONARY ANGIOGRAM;  Surgeon: Othella Boyer, MD;  Location: [facility] CATH LAB;  Service: Cardiovascular;  Laterality: N/A;   PERCUTANEOUS CORONARY STENT INTERVENTION (PCI-S)  11/24/2012   Procedure: PERCUTANEOUS CORONARY STENT INTERVENTION (PCI-S);  Surgeon: Othella Boyer, MD;  Location: [facility] CATH LAB;  Service: Cardiovascular;;   PERCUTANEOUS CORONARY STENT INTERVENTION (PCI-S) Right 07/05/2013   Procedure: PERCUTANEOUS CORONARY STENT INTERVENTION (PCI-S);  Surgeon: Othella Boyer, MD;  Location: [facility] CATH LAB;  Service: Cardiovascular;  Laterality: Right;   TEE WITHOUT CARDIOVERSION N/A 07/25/2019   Procedure: TRANSESOPHAGEAL ECHOCARDIOGRAM (TEE);  Surgeon: Loreli Slot, MD;  Location: [facility] OR;  Service: Open Heart Surgery;  Laterality: N/A;    Current Medications: Current Meds  Medication Sig   aspirin 81 MG chewable tablet Chew 1 tablet (81 mg total) by mouth daily.   Cholecalciferol (VITAMIN D3 PO) Take 1 tablet by mouth every 7 (seven) days.   Evolocumab (REPATHA SURECLICK) 140 MG/ML SOAJ INJECT 1 PEN INTO THE SKIN EVERY 14 (FOURTEEN) DAYS. (Patient taking differently: Inject 140 mg into the skin every 14 (fourteen) days.)   famotidine (PEPCID) 10 MG tablet Take 10 mg by mouth 2 (two) times daily.   isosorbide mononitrate (IMDUR) 30 MG 24 hr tablet TAKE 1 TABLET BY MOUTH EVERY DAY   metoprolol succinate (TOPROL-XL) 50 MG 24 hr tablet Take 1 tablet (50 mg total) by mouth daily. Take with or immediately following a meal.   Multiple Vitamins-Minerals (ZINC PO) Take 1 tablet by mouth once a week. Takes every 3-4 days   nitroGLYCERIN (NITROSTAT) 0.4 MG SL  tablet PLACE 1 TABLET UNDER THE TONGUE EVERY 5 MINUTES AS NEEDED FOR CHEST PAIN (Patient taking differently: Place 0.4 mg under the tongue every 5 (five) minutes as needed for chest pain.)   rosuvastatin (CRESTOR) 10 MG tablet Take 1 tablet (10 mg total) by mouth daily.   valsartan (DIOVAN) 80 MG tablet TAKE 1 TABLET BY MOUTH EVERY DAY   [DISCONTINUED] isosorbide dinitrate (ISORDIL) 10 MG tablet Take 10 mg by mouth 2 (two) times daily.   [DISCONTINUED] oxyCODONE (OXY IR/ROXICODONE) 5 MG immediate release tablet Take 5 mg by mouth every 6 (six) hours as needed for moderate pain (pain score 4-6) or severe pain (pain score 7-10).   [DISCONTINUED] ticagrelor (BRILINTA) 90 MG TABS tablet Take 90 mg by mouth 2 (two) times daily.     Allergies:   Shellfish allergy, Milk-related compounds, and Ramipril   Social History   Socioeconomic History   Marital status: Married    Spouse name: Not on file   Number of children: Not on file   Years of education: Not on  file   Highest education level: Not on file  Occupational History   Occupation: PARKING MANAGER    Employer: [employer]  Tobacco Use   Smoking status: Never   Smokeless tobacco: Never  Vaping Use   Vaping status: Never Used  Substance and Sexual Activity   Alcohol use: No   Drug use: No   Sexual activity: Yes  Other Topics Concern   Not on file  Social History Narrative   Not on file   Social Drivers of Health   Financial Resource Strain: Not on file  Food Insecurity: Not on file  Transportation Needs: Not on file  Physical Activity: Not on file  Stress: Not on file  Social Connections: Not on file     Family History: The patient's family history includes Coronary artery disease in an other family member; Heart attack (age of onset: 78) in his mother; Heart attack (age of onset: 13) in his father; Stroke (age of onset: 26) in his mother. ROS:   Please see the history of present illness.    All 14 point review of systems  negative except as described per history of present illness  EKGs/Labs/Other Studies Reviewed:    EKG Interpretation Date/Time:  Wednesday March 23 2023 16:25:11 EST Ventricular Rate:  62 PR Interval:  190 QRS Duration:  98 QT Interval:  402 QTC Calculation: 408 R Axis:   -4  Text Interpretation: Normal sinus rhythm Inferior infarct (cited on or before 26-Jul-2019) Anterior infarct (cited on or before 16-Apr-2020) When compared with ECG of 27-Mar-2022 22:49, No significant change was found Confirmed by Gypsy Balsam [phone]) on 03/23/2023 4:34:43 PM    Recent Labs: 03/27/2022: Hemoglobin 13.6; Platelets 226 06/07/2022: ALT 19; BUN 21; Creatinine, Ser 1.23; Potassium 4.4; Sodium 139  Recent Lipid Panel    Component Value Date/Time   CHOL 71 (L) 06/07/2022 0845   TRIG 52 06/07/2022 0845   HDL 47 06/07/2022 0845   CHOLHDL 1.5 06/07/2022 0845   CHOLHDL 2.3 07/24/2019 1946   VLDL 15 07/24/2019 1946   LDLCALC 11 06/07/2022 0845    Physical Exam:    VS:  BP 116/76 (BP Location: Right Arm, Patient Position: Sitting)   Pulse 67   Ht 5\' 11"  (1.803 m)   Wt 189 lb (85.7 kg)   SpO2 99%   BMI 26.36 kg/m     Wt Readings from Last 3 Encounters:  03/23/23 189 lb (85.7 kg)  06/07/22 187 lb (84.8 kg)  03/27/22 190 lb (86.2 kg)     GEN:  Well nourished, well developed in no acute distress HEENT: Normal NECK: No JVD; No carotid bruits LYMPHATICS: No lymphadenopathy CARDIAC: RRR, no murmurs, no rubs, no gallops RESPIRATORY:  Clear to auscultation without rales, wheezing or rhonchi  ABDOMEN: Soft, non-tender, non-distended MUSCULOSKELETAL:  No edema; No deformity  SKIN: Warm and dry LOWER EXTREMITIES: no swelling NEUROLOGIC:  Alert and oriented x 3 PSYCHIATRIC:  Normal affect   ASSESSMENT:    1. Coronary artery disease involving native coronary artery of native heart without angina pectoris   2. S/P CABG x 4   3. Mixed hyperlipidemia   4. Benign essential hypertension     PLAN:    In order of problems listed above:  Coronary disease status post coronary bypass graft with occluded grafts to LAD and obtuse marginal branch but native arteries patent.  He is thinking about getting ready for semimonitoring.  Will schedule him to have exercise Cardiolite especially in view of the  fact that he have some symptomatology which I have to admit is somewhat atypical.  In the meantime we will continue antiplatelets therapy. Dyslipidemia I did review K PN which show me his LDL of 11 HDL 47 this is from April of last year and he is on Repatha which I will continue. Benign essential hypertension blood pressure well-controlled.   Medication Adjustments/Labs and Tests Ordered: Current medicines are reviewed at length with the patient today.  Concerns regarding medicines are outlined above.  Orders Placed This Encounter  Procedures   EKG 12-Lead   Medication changes: No orders of the defined types were placed in this encounter.   Signed, Georgeanna Lea, MD, [facility] 03/23/2023 5:05 PM    [facility]

## 2023-03-25 ENCOUNTER — Telehealth (HOSPITAL_COMMUNITY): Payer: Self-pay | Admitting: *Deleted

## 2023-03-25 NOTE — Telephone Encounter (Signed)
Patient given detailed instructions per Myocardial Perfusion Study Information Sheet for the test on 03/30/2023 at 7:45. Patient notified to arrive 15 minutes early and that it is imperative to arrive on time for appointment to keep from having the test rescheduled.  If you need to cancel or reschedule your appointment, please call the office within 24 hours of your appointment. . Patient verbalized understanding.Tyler Sweeney

## 2023-03-30 ENCOUNTER — Encounter (HOSPITAL_COMMUNITY): Payer: Commercial Managed Care - PPO

## 2023-04-04 ENCOUNTER — Ambulatory Visit (HOSPITAL_COMMUNITY): Payer: Commercial Managed Care - PPO | Attending: Cardiology

## 2023-04-04 DIAGNOSIS — I2510 Atherosclerotic heart disease of native coronary artery without angina pectoris: Secondary | ICD-10-CM | POA: Diagnosis not present

## 2023-04-04 LAB — MYOCARDIAL PERFUSION IMAGING
Angina Index: 0
Duke Treadmill Score: 11
Estimated workload: 13.4
Exercise duration (min): 11 min
Exercise duration (sec): 16 s
LV dias vol: 106 mL (ref 62–150)
LV sys vol: 49 mL
MPHR: 157 {beats}/min
Nuc Stress EF: 54 %
Peak HR: 162 {beats}/min
Percent HR: 103 %
Rest HR: 60 {beats}/min
Rest Nuclear Isotope Dose: 11 mCi
SDS: 1
SRS: 3
SSS: 4
ST Depression (mm): 0 mm
Stress Nuclear Isotope Dose: 31 mCi
TID: 0.95

## 2023-04-04 MED ORDER — TECHNETIUM TC 99M TETROFOSMIN IV KIT
31.0000 | PACK | Freq: Once | INTRAVENOUS | Status: AC | PRN
  Administered 2023-04-04: 31 via INTRAVENOUS

## 2023-04-04 MED ORDER — TECHNETIUM TC 99M TETROFOSMIN IV KIT
11.0000 | PACK | Freq: Once | INTRAVENOUS | Status: AC | PRN
  Administered 2023-04-04: 11 via INTRAVENOUS

## 2023-04-18 ENCOUNTER — Telehealth: Payer: Self-pay

## 2023-04-18 NOTE — Telephone Encounter (Signed)
 Stress Test Results reviewed with pt as per Dr. Vanetta Shawl note.  Pt verbalized understanding and had no additional questions. Routed to PCP

## 2023-05-17 ENCOUNTER — Other Ambulatory Visit: Payer: Self-pay | Admitting: Cardiology

## 2023-07-26 ENCOUNTER — Other Ambulatory Visit: Payer: Self-pay | Admitting: Cardiology

## 2023-08-15 ENCOUNTER — Other Ambulatory Visit: Payer: Self-pay | Admitting: Cardiology

## 2023-12-03 ENCOUNTER — Other Ambulatory Visit: Payer: Self-pay | Admitting: Cardiology

## 2024-01-10 ENCOUNTER — Other Ambulatory Visit: Payer: Self-pay | Admitting: Cardiology

## 2024-02-14 ENCOUNTER — Other Ambulatory Visit: Payer: Self-pay | Admitting: Cardiology

## 2024-05-03 ENCOUNTER — Ambulatory Visit: Admitting: Cardiology
